# Patient Record
Sex: Male | Born: 1935 | Race: White | Hispanic: No | Smoking: Never smoker
Health system: Southern US, Community
[De-identification: ages and names within clinical notes are randomized; demographics above are authoritative.]

## PROBLEM LIST (undated history)

## (undated) DIAGNOSIS — F419 Anxiety disorder, unspecified: Secondary | ICD-10-CM

## (undated) DIAGNOSIS — I10 Essential (primary) hypertension: Secondary | ICD-10-CM

## (undated) DIAGNOSIS — J310 Chronic rhinitis: Secondary | ICD-10-CM

## (undated) DIAGNOSIS — I509 Heart failure, unspecified: Secondary | ICD-10-CM

## (undated) DIAGNOSIS — I472 Ventricular tachycardia, unspecified: Secondary | ICD-10-CM

## (undated) DIAGNOSIS — J9611 Chronic respiratory failure with hypoxia: Secondary | ICD-10-CM

## (undated) DIAGNOSIS — E785 Hyperlipidemia, unspecified: Secondary | ICD-10-CM

## (undated) DIAGNOSIS — K219 Gastro-esophageal reflux disease without esophagitis: Secondary | ICD-10-CM

## (undated) DIAGNOSIS — C61 Malignant neoplasm of prostate: Secondary | ICD-10-CM

## (undated) DIAGNOSIS — E041 Nontoxic single thyroid nodule: Secondary | ICD-10-CM

## (undated) DIAGNOSIS — G4733 Obstructive sleep apnea (adult) (pediatric): Secondary | ICD-10-CM

## (undated) DIAGNOSIS — J8410 Pulmonary fibrosis, unspecified: Secondary | ICD-10-CM

## (undated) DIAGNOSIS — I2510 Atherosclerotic heart disease of native coronary artery without angina pectoris: Secondary | ICD-10-CM

## (undated) DIAGNOSIS — I441 Atrioventricular block, second degree: Secondary | ICD-10-CM

## (undated) DIAGNOSIS — J189 Pneumonia, unspecified organism: Secondary | ICD-10-CM

## (undated) DIAGNOSIS — M7030 Other bursitis of elbow, unspecified elbow: Secondary | ICD-10-CM

## (undated) DIAGNOSIS — Z9001 Acquired absence of eye: Secondary | ICD-10-CM

## (undated) DIAGNOSIS — I351 Nonrheumatic aortic (valve) insufficiency: Secondary | ICD-10-CM

## (undated) DIAGNOSIS — I776 Arteritis, unspecified: Secondary | ICD-10-CM

## (undated) HISTORY — DX: Ventricular tachycardia: I47.2

## (undated) HISTORY — DX: Nontoxic single thyroid nodule: E04.1

## (undated) HISTORY — DX: Heart failure, unspecified: I50.9

## (undated) HISTORY — DX: Acquired absence of eye: Z90.01

## (undated) HISTORY — DX: Malignant neoplasm of prostate: C61

## (undated) HISTORY — DX: Nonrheumatic aortic (valve) insufficiency: I35.1

## (undated) HISTORY — PX: PACEMAKER INSERTION: SHX728

## (undated) HISTORY — DX: Atrioventricular block, second degree: I44.1

## (undated) HISTORY — DX: Essential (primary) hypertension: I10

## (undated) HISTORY — PX: TYMPANIC MEMBRANE REPAIR: SHX294

## (undated) HISTORY — DX: Obstructive sleep apnea (adult) (pediatric): G47.33

## (undated) HISTORY — PX: SEPTOPLASTY: SUR1290

## (undated) HISTORY — DX: Pneumonia, unspecified organism: J18.9

## (undated) HISTORY — PX: TRANSURETHRAL RESECTION OF PROSTATE: SHX73

## (undated) HISTORY — PX: TYMPANOPLASTY: SHX33

## (undated) HISTORY — DX: Ventricular tachycardia, unspecified: I47.20

## (undated) HISTORY — DX: Chronic respiratory failure with hypoxia: J96.11

## (undated) HISTORY — DX: Anxiety disorder, unspecified: F41.9

## (undated) HISTORY — DX: Other bursitis of elbow, unspecified elbow: M70.30

## (undated) HISTORY — DX: Gastro-esophageal reflux disease without esophagitis: K21.9

## (undated) HISTORY — DX: Hyperlipidemia, unspecified: E78.5

## (undated) HISTORY — DX: Arteritis, unspecified: I77.6

## (undated) HISTORY — DX: Chronic rhinitis: J31.0

## (undated) HISTORY — DX: Atherosclerotic heart disease of native coronary artery without angina pectoris: I25.10

## (undated) HISTORY — DX: Pulmonary fibrosis, unspecified: J84.10

---

## 1999-10-10 HISTORY — PX: CORONARY ARTERY BYPASS GRAFT: SHX141

## 1999-10-12 ENCOUNTER — Inpatient Hospital Stay (HOSPITAL_COMMUNITY): Admission: EM | Admit: 1999-10-12 | Discharge: 1999-10-20 | Payer: Self-pay

## 1999-10-13 ENCOUNTER — Encounter: Payer: Self-pay | Admitting: Cardiology

## 1999-10-15 ENCOUNTER — Encounter: Payer: Self-pay | Admitting: Cardiothoracic Surgery

## 1999-10-16 ENCOUNTER — Encounter: Payer: Self-pay | Admitting: Cardiothoracic Surgery

## 1999-10-17 ENCOUNTER — Encounter: Payer: Self-pay | Admitting: Cardiothoracic Surgery

## 1999-11-12 ENCOUNTER — Encounter (HOSPITAL_COMMUNITY): Admission: RE | Admit: 1999-11-12 | Discharge: 2000-02-10 | Payer: Self-pay | Admitting: Cardiothoracic Surgery

## 2000-03-06 ENCOUNTER — Encounter: Payer: Self-pay | Admitting: Emergency Medicine

## 2000-03-06 ENCOUNTER — Emergency Department (HOSPITAL_COMMUNITY): Admission: EM | Admit: 2000-03-06 | Discharge: 2000-03-06 | Payer: Self-pay | Admitting: Emergency Medicine

## 2000-06-01 ENCOUNTER — Encounter: Payer: Self-pay | Admitting: Emergency Medicine

## 2000-06-01 ENCOUNTER — Emergency Department (HOSPITAL_COMMUNITY): Admission: EM | Admit: 2000-06-01 | Discharge: 2000-06-01 | Payer: Self-pay | Admitting: Emergency Medicine

## 2000-07-27 ENCOUNTER — Encounter: Payer: Self-pay | Admitting: Otolaryngology

## 2000-07-27 ENCOUNTER — Encounter: Admission: RE | Admit: 2000-07-27 | Discharge: 2000-07-27 | Payer: Self-pay | Admitting: Otolaryngology

## 2000-09-08 HISTORY — PX: NEPHRECTOMY: SHX65

## 2000-09-11 ENCOUNTER — Encounter: Payer: Self-pay | Admitting: Emergency Medicine

## 2000-09-11 ENCOUNTER — Emergency Department (HOSPITAL_COMMUNITY): Admission: EM | Admit: 2000-09-11 | Discharge: 2000-09-11 | Payer: Self-pay | Admitting: Emergency Medicine

## 2000-11-22 ENCOUNTER — Encounter (INDEPENDENT_AMBULATORY_CARE_PROVIDER_SITE_OTHER): Payer: Self-pay

## 2000-11-22 ENCOUNTER — Encounter: Payer: Self-pay | Admitting: Internal Medicine

## 2000-11-22 ENCOUNTER — Inpatient Hospital Stay (HOSPITAL_COMMUNITY): Admission: EM | Admit: 2000-11-22 | Discharge: 2000-11-28 | Payer: Self-pay | Admitting: Emergency Medicine

## 2000-11-24 ENCOUNTER — Encounter: Payer: Self-pay | Admitting: Internal Medicine

## 2000-11-25 ENCOUNTER — Encounter: Payer: Self-pay | Admitting: Internal Medicine

## 2000-11-26 ENCOUNTER — Encounter: Payer: Self-pay | Admitting: Internal Medicine

## 2000-11-27 ENCOUNTER — Encounter: Payer: Self-pay | Admitting: Urology

## 2000-12-09 ENCOUNTER — Ambulatory Visit (HOSPITAL_COMMUNITY): Admission: RE | Admit: 2000-12-09 | Discharge: 2000-12-09 | Payer: Self-pay | Admitting: Internal Medicine

## 2000-12-09 ENCOUNTER — Encounter: Payer: Self-pay | Admitting: Internal Medicine

## 2000-12-15 ENCOUNTER — Encounter: Payer: Self-pay | Admitting: Urology

## 2000-12-21 ENCOUNTER — Encounter (INDEPENDENT_AMBULATORY_CARE_PROVIDER_SITE_OTHER): Payer: Self-pay

## 2000-12-21 ENCOUNTER — Encounter: Payer: Self-pay | Admitting: Urology

## 2000-12-21 ENCOUNTER — Inpatient Hospital Stay (HOSPITAL_COMMUNITY): Admission: RE | Admit: 2000-12-21 | Discharge: 2000-12-24 | Payer: Self-pay | Admitting: Urology

## 2001-02-03 ENCOUNTER — Encounter: Admission: RE | Admit: 2001-02-03 | Discharge: 2001-02-03 | Payer: Self-pay | Admitting: Thoracic Surgery

## 2001-02-03 ENCOUNTER — Encounter: Payer: Self-pay | Admitting: Thoracic Surgery

## 2001-07-01 ENCOUNTER — Ambulatory Visit (HOSPITAL_COMMUNITY): Admission: RE | Admit: 2001-07-01 | Discharge: 2001-07-01 | Payer: Self-pay | Admitting: Urology

## 2001-07-01 ENCOUNTER — Encounter: Payer: Self-pay | Admitting: Urology

## 2001-07-12 ENCOUNTER — Encounter: Admission: RE | Admit: 2001-07-12 | Discharge: 2001-07-12 | Payer: Self-pay | Admitting: Urology

## 2001-07-12 ENCOUNTER — Encounter: Payer: Self-pay | Admitting: Urology

## 2001-11-10 ENCOUNTER — Emergency Department (HOSPITAL_COMMUNITY): Admission: EM | Admit: 2001-11-10 | Discharge: 2001-11-10 | Payer: Self-pay | Admitting: *Deleted

## 2001-11-12 ENCOUNTER — Emergency Department (HOSPITAL_COMMUNITY): Admission: EM | Admit: 2001-11-12 | Discharge: 2001-11-12 | Payer: Self-pay | Admitting: *Deleted

## 2001-11-19 ENCOUNTER — Encounter: Payer: Self-pay | Admitting: Internal Medicine

## 2001-11-19 ENCOUNTER — Ambulatory Visit (HOSPITAL_COMMUNITY): Admission: RE | Admit: 2001-11-19 | Discharge: 2001-11-19 | Payer: Self-pay | Admitting: Internal Medicine

## 2002-01-24 ENCOUNTER — Ambulatory Visit (HOSPITAL_COMMUNITY): Admission: RE | Admit: 2002-01-24 | Discharge: 2002-01-24 | Payer: Self-pay | Admitting: Urology

## 2002-01-24 ENCOUNTER — Encounter: Payer: Self-pay | Admitting: Urology

## 2003-05-18 ENCOUNTER — Inpatient Hospital Stay (HOSPITAL_COMMUNITY): Admission: EM | Admit: 2003-05-18 | Discharge: 2003-05-19 | Payer: Self-pay | Admitting: Emergency Medicine

## 2003-05-18 ENCOUNTER — Encounter: Payer: Self-pay | Admitting: Emergency Medicine

## 2004-04-19 ENCOUNTER — Inpatient Hospital Stay (HOSPITAL_COMMUNITY): Admission: EM | Admit: 2004-04-19 | Discharge: 2004-04-20 | Payer: Self-pay | Admitting: Emergency Medicine

## 2004-08-15 ENCOUNTER — Ambulatory Visit: Payer: Self-pay | Admitting: Family Medicine

## 2004-09-11 ENCOUNTER — Ambulatory Visit: Payer: Self-pay | Admitting: Gastroenterology

## 2004-09-25 ENCOUNTER — Ambulatory Visit (HOSPITAL_COMMUNITY): Admission: RE | Admit: 2004-09-25 | Discharge: 2004-09-25 | Payer: Self-pay | Admitting: Urology

## 2004-09-26 ENCOUNTER — Ambulatory Visit: Payer: Self-pay | Admitting: Pulmonary Disease

## 2004-10-15 ENCOUNTER — Ambulatory Visit: Payer: Self-pay | Admitting: Gastroenterology

## 2004-10-21 ENCOUNTER — Ambulatory Visit (HOSPITAL_COMMUNITY): Admission: RE | Admit: 2004-10-21 | Discharge: 2004-10-21 | Payer: Self-pay | Admitting: Gastroenterology

## 2004-10-21 ENCOUNTER — Ambulatory Visit: Payer: Self-pay | Admitting: Pulmonary Disease

## 2004-11-15 ENCOUNTER — Ambulatory Visit: Payer: Self-pay | Admitting: Pulmonary Disease

## 2004-11-27 ENCOUNTER — Ambulatory Visit: Payer: Self-pay | Admitting: Family Medicine

## 2004-12-02 ENCOUNTER — Ambulatory Visit: Payer: Self-pay | Admitting: Pulmonary Disease

## 2004-12-13 ENCOUNTER — Ambulatory Visit: Payer: Self-pay | Admitting: Cardiology

## 2004-12-31 ENCOUNTER — Ambulatory Visit: Admission: RE | Admit: 2004-12-31 | Discharge: 2005-03-31 | Payer: Self-pay | Admitting: Radiation Oncology

## 2005-01-15 ENCOUNTER — Ambulatory Visit: Payer: Self-pay | Admitting: Pulmonary Disease

## 2005-01-24 ENCOUNTER — Ambulatory Visit: Payer: Self-pay

## 2005-02-07 ENCOUNTER — Ambulatory Visit: Payer: Self-pay | Admitting: Cardiology

## 2005-02-11 ENCOUNTER — Ambulatory Visit: Payer: Self-pay | Admitting: Pulmonary Disease

## 2005-03-21 ENCOUNTER — Ambulatory Visit: Payer: Self-pay | Admitting: Pulmonary Disease

## 2005-04-01 ENCOUNTER — Ambulatory Visit: Admission: RE | Admit: 2005-04-01 | Discharge: 2005-06-30 | Payer: Self-pay | Admitting: Radiation Oncology

## 2005-04-02 ENCOUNTER — Ambulatory Visit: Payer: Self-pay | Admitting: Pulmonary Disease

## 2005-05-07 ENCOUNTER — Ambulatory Visit: Payer: Self-pay | Admitting: Pulmonary Disease

## 2005-05-13 ENCOUNTER — Ambulatory Visit: Payer: Self-pay | Admitting: Cardiology

## 2005-05-28 ENCOUNTER — Ambulatory Visit: Payer: Self-pay | Admitting: Cardiology

## 2005-06-11 ENCOUNTER — Ambulatory Visit: Payer: Self-pay

## 2005-06-16 ENCOUNTER — Ambulatory Visit: Payer: Self-pay | Admitting: Pulmonary Disease

## 2005-06-18 ENCOUNTER — Ambulatory Visit: Payer: Self-pay | Admitting: Cardiology

## 2005-07-03 ENCOUNTER — Ambulatory Visit: Payer: Self-pay

## 2005-07-24 ENCOUNTER — Ambulatory Visit: Payer: Self-pay | Admitting: Pulmonary Disease

## 2005-08-08 ENCOUNTER — Ambulatory Visit: Admission: RE | Admit: 2005-08-08 | Discharge: 2005-08-08 | Payer: Self-pay | Admitting: Pulmonary Disease

## 2005-08-08 ENCOUNTER — Ambulatory Visit: Payer: Self-pay | Admitting: Pulmonary Disease

## 2005-08-20 ENCOUNTER — Ambulatory Visit: Payer: Self-pay | Admitting: Family Medicine

## 2005-08-26 ENCOUNTER — Ambulatory Visit: Payer: Self-pay | Admitting: Family Medicine

## 2005-08-27 ENCOUNTER — Ambulatory Visit: Payer: Self-pay | Admitting: Family Medicine

## 2005-08-29 ENCOUNTER — Ambulatory Visit: Payer: Self-pay | Admitting: Pulmonary Disease

## 2005-10-10 ENCOUNTER — Ambulatory Visit: Payer: Self-pay | Admitting: Pulmonary Disease

## 2005-10-15 ENCOUNTER — Ambulatory Visit: Payer: Self-pay | Admitting: Cardiology

## 2005-12-08 ENCOUNTER — Ambulatory Visit: Payer: Self-pay | Admitting: Pulmonary Disease

## 2005-12-19 ENCOUNTER — Ambulatory Visit: Payer: Self-pay | Admitting: Cardiology

## 2006-01-07 ENCOUNTER — Encounter (HOSPITAL_COMMUNITY): Admission: RE | Admit: 2006-01-07 | Discharge: 2006-04-07 | Payer: Self-pay | Admitting: Urology

## 2006-01-27 ENCOUNTER — Ambulatory Visit: Payer: Self-pay | Admitting: Family Medicine

## 2006-02-11 ENCOUNTER — Ambulatory Visit: Payer: Self-pay | Admitting: Pulmonary Disease

## 2006-02-11 ENCOUNTER — Ambulatory Visit: Payer: Self-pay | Admitting: Family Medicine

## 2006-04-22 ENCOUNTER — Ambulatory Visit: Payer: Self-pay | Admitting: Family Medicine

## 2006-04-23 ENCOUNTER — Ambulatory Visit: Payer: Self-pay | Admitting: Pulmonary Disease

## 2006-04-30 ENCOUNTER — Ambulatory Visit: Payer: Self-pay | Admitting: Family Medicine

## 2006-05-08 ENCOUNTER — Ambulatory Visit: Payer: Self-pay | Admitting: Pulmonary Disease

## 2006-05-27 ENCOUNTER — Ambulatory Visit: Payer: Self-pay | Admitting: Pulmonary Disease

## 2006-06-23 ENCOUNTER — Ambulatory Visit: Payer: Self-pay | Admitting: Internal Medicine

## 2006-07-07 ENCOUNTER — Ambulatory Visit: Payer: Self-pay | Admitting: Pulmonary Disease

## 2006-07-14 ENCOUNTER — Ambulatory Visit: Payer: Self-pay | Admitting: Internal Medicine

## 2006-07-22 ENCOUNTER — Ambulatory Visit: Payer: Self-pay | Admitting: Family Medicine

## 2006-08-10 ENCOUNTER — Inpatient Hospital Stay (HOSPITAL_COMMUNITY): Admission: EM | Admit: 2006-08-10 | Discharge: 2006-08-13 | Payer: Self-pay | Admitting: Emergency Medicine

## 2006-08-10 ENCOUNTER — Ambulatory Visit: Payer: Self-pay | Admitting: Cardiovascular Disease

## 2006-08-25 ENCOUNTER — Ambulatory Visit: Payer: Self-pay | Admitting: Family Medicine

## 2006-08-27 ENCOUNTER — Ambulatory Visit: Payer: Self-pay | Admitting: Pulmonary Disease

## 2006-09-03 ENCOUNTER — Ambulatory Visit: Payer: Self-pay

## 2006-09-10 ENCOUNTER — Emergency Department (HOSPITAL_COMMUNITY): Admission: EM | Admit: 2006-09-10 | Discharge: 2006-09-11 | Payer: Self-pay | Admitting: Emergency Medicine

## 2006-09-17 ENCOUNTER — Ambulatory Visit: Payer: Self-pay | Admitting: Cardiology

## 2006-09-21 ENCOUNTER — Ambulatory Visit: Payer: Self-pay | Admitting: Cardiology

## 2006-09-24 ENCOUNTER — Ambulatory Visit (HOSPITAL_BASED_OUTPATIENT_CLINIC_OR_DEPARTMENT_OTHER): Admission: RE | Admit: 2006-09-24 | Discharge: 2006-09-24 | Payer: Self-pay | Admitting: Pulmonary Disease

## 2006-09-24 ENCOUNTER — Ambulatory Visit: Payer: Self-pay | Admitting: Pulmonary Disease

## 2006-09-29 ENCOUNTER — Ambulatory Visit: Payer: Self-pay | Admitting: Cardiology

## 2006-09-29 ENCOUNTER — Ambulatory Visit: Payer: Self-pay

## 2006-09-29 ENCOUNTER — Encounter: Payer: Self-pay | Admitting: Cardiology

## 2006-09-29 LAB — CONVERTED CEMR LAB
CO2: 30 meq/L (ref 19–32)
Chloride: 104 meq/L (ref 96–112)
Creatinine, Ser: 1.5 mg/dL (ref 0.4–1.5)
Glucose, Bld: 81 mg/dL (ref 70–99)
Potassium: 4.5 meq/L (ref 3.5–5.1)
Sodium: 141 meq/L (ref 135–145)

## 2006-10-05 ENCOUNTER — Ambulatory Visit: Payer: Self-pay | Admitting: Family Medicine

## 2006-11-10 ENCOUNTER — Ambulatory Visit: Payer: Self-pay | Admitting: Pulmonary Disease

## 2006-12-02 ENCOUNTER — Ambulatory Visit: Payer: Self-pay | Admitting: Family Medicine

## 2006-12-02 ENCOUNTER — Encounter: Admission: RE | Admit: 2006-12-02 | Discharge: 2006-12-02 | Payer: Self-pay | Admitting: Family Medicine

## 2006-12-04 ENCOUNTER — Ambulatory Visit: Payer: Self-pay | Admitting: Pulmonary Disease

## 2006-12-11 ENCOUNTER — Ambulatory Visit: Payer: Self-pay | Admitting: Critical Care Medicine

## 2006-12-13 ENCOUNTER — Inpatient Hospital Stay (HOSPITAL_COMMUNITY): Admission: EM | Admit: 2006-12-13 | Discharge: 2006-12-19 | Payer: Self-pay | Admitting: Emergency Medicine

## 2006-12-13 ENCOUNTER — Ambulatory Visit: Payer: Self-pay | Admitting: Pulmonary Disease

## 2006-12-14 ENCOUNTER — Ambulatory Visit: Payer: Self-pay | Admitting: Internal Medicine

## 2006-12-22 ENCOUNTER — Ambulatory Visit: Payer: Self-pay | Admitting: Pulmonary Disease

## 2007-01-08 ENCOUNTER — Ambulatory Visit: Payer: Self-pay | Admitting: Cardiology

## 2007-01-11 ENCOUNTER — Ambulatory Visit: Payer: Self-pay | Admitting: Pulmonary Disease

## 2007-01-12 ENCOUNTER — Ambulatory Visit: Payer: Self-pay | Admitting: Cardiology

## 2007-01-12 ENCOUNTER — Ambulatory Visit: Payer: Self-pay | Admitting: Internal Medicine

## 2007-01-12 LAB — CONVERTED CEMR LAB
ALT: 16 units/L (ref 0–40)
AST: 14 units/L (ref 0–37)
Albumin: 3.5 g/dL (ref 3.5–5.2)
BUN: 17 mg/dL (ref 6–23)
Bilirubin, Direct: 0.1 mg/dL (ref 0.0–0.3)
CO2: 28 meq/L (ref 19–32)
Calcium: 9.2 mg/dL (ref 8.4–10.5)
Chloride: 107 meq/L (ref 96–112)
Chloride: 107 meq/L (ref 96–112)
Cholesterol: 164 mg/dL (ref 0–200)
Creatinine, Ser: 1.4 mg/dL (ref 0.4–1.5)
GFR calc Af Amer: 62 mL/min
GFR calc non Af Amer: 52 mL/min
Glucose, Bld: 86 mg/dL (ref 70–99)
LDL Cholesterol: 78 mg/dL (ref 0–99)
Magnesium: 2.3 mg/dL (ref 1.5–2.5)
Sodium: 142 meq/L (ref 135–145)
Total CHOL/HDL Ratio: 2.2
VLDL: 10 mg/dL (ref 0–40)

## 2007-01-15 ENCOUNTER — Ambulatory Visit: Payer: Self-pay

## 2007-02-08 ENCOUNTER — Ambulatory Visit: Payer: Self-pay | Admitting: Pulmonary Disease

## 2007-03-04 ENCOUNTER — Ambulatory Visit: Payer: Self-pay | Admitting: Pulmonary Disease

## 2007-03-17 ENCOUNTER — Ambulatory Visit: Payer: Self-pay | Admitting: Internal Medicine

## 2007-03-17 ENCOUNTER — Ambulatory Visit: Payer: Self-pay | Admitting: Pulmonary Disease

## 2007-04-02 ENCOUNTER — Ambulatory Visit: Payer: Self-pay | Admitting: Family Medicine

## 2007-04-02 DIAGNOSIS — E785 Hyperlipidemia, unspecified: Secondary | ICD-10-CM | POA: Insufficient documentation

## 2007-04-02 DIAGNOSIS — E041 Nontoxic single thyroid nodule: Secondary | ICD-10-CM

## 2007-04-02 DIAGNOSIS — Z8546 Personal history of malignant neoplasm of prostate: Secondary | ICD-10-CM | POA: Insufficient documentation

## 2007-04-02 DIAGNOSIS — K219 Gastro-esophageal reflux disease without esophagitis: Secondary | ICD-10-CM | POA: Insufficient documentation

## 2007-04-02 DIAGNOSIS — I10 Essential (primary) hypertension: Secondary | ICD-10-CM | POA: Insufficient documentation

## 2007-04-07 ENCOUNTER — Telehealth: Payer: Self-pay | Admitting: Family Medicine

## 2007-04-08 ENCOUNTER — Encounter: Admission: RE | Admit: 2007-04-08 | Discharge: 2007-04-08 | Payer: Self-pay | Admitting: Family Medicine

## 2007-04-12 ENCOUNTER — Ambulatory Visit: Payer: Self-pay | Admitting: Internal Medicine

## 2007-04-15 ENCOUNTER — Inpatient Hospital Stay (HOSPITAL_COMMUNITY): Admission: AD | Admit: 2007-04-15 | Discharge: 2007-04-20 | Payer: Self-pay | Admitting: Pulmonary Disease

## 2007-04-15 ENCOUNTER — Ambulatory Visit: Payer: Self-pay | Admitting: Internal Medicine

## 2007-04-15 ENCOUNTER — Ambulatory Visit: Payer: Self-pay | Admitting: Pulmonary Disease

## 2007-04-22 ENCOUNTER — Telehealth: Payer: Self-pay | Admitting: Family Medicine

## 2007-04-26 ENCOUNTER — Ambulatory Visit: Payer: Self-pay | Admitting: Internal Medicine

## 2007-04-27 ENCOUNTER — Ambulatory Visit: Payer: Self-pay

## 2007-04-27 LAB — CONVERTED CEMR LAB
BUN: 40 mg/dL — ABNORMAL HIGH (ref 6–23)
CO2: 32 meq/L (ref 19–32)
Chloride: 105 meq/L (ref 96–112)
Creatinine, Ser: 1.4 mg/dL (ref 0.4–1.5)
Pro B Natriuretic peptide (BNP): 249 pg/mL — ABNORMAL HIGH (ref 0.0–100.0)
Sodium: 144 meq/L (ref 135–145)

## 2007-05-04 ENCOUNTER — Ambulatory Visit: Payer: Self-pay | Admitting: Internal Medicine

## 2007-05-04 LAB — CONVERTED CEMR LAB
BUN: 37 mg/dL — ABNORMAL HIGH (ref 6–23)
CO2: 28 meq/L (ref 19–32)
Chloride: 104 meq/L (ref 96–112)
Creatinine, Ser: 1.5 mg/dL (ref 0.4–1.5)
Pro B Natriuretic peptide (BNP): 190 pg/mL — ABNORMAL HIGH (ref 0.0–100.0)

## 2007-05-05 ENCOUNTER — Inpatient Hospital Stay (HOSPITAL_COMMUNITY): Admission: EM | Admit: 2007-05-05 | Discharge: 2007-05-13 | Payer: Self-pay | Admitting: Pulmonary Disease

## 2007-05-05 ENCOUNTER — Ambulatory Visit: Payer: Self-pay | Admitting: Pulmonary Disease

## 2007-05-05 ENCOUNTER — Ambulatory Visit: Payer: Self-pay | Admitting: Cardiology

## 2007-05-06 ENCOUNTER — Ambulatory Visit: Payer: Self-pay | Admitting: Internal Medicine

## 2007-05-24 ENCOUNTER — Encounter: Payer: Self-pay | Admitting: Family Medicine

## 2007-05-27 ENCOUNTER — Ambulatory Visit: Payer: Self-pay | Admitting: Pulmonary Disease

## 2007-05-27 ENCOUNTER — Ambulatory Visit: Payer: Self-pay | Admitting: Internal Medicine

## 2007-06-01 ENCOUNTER — Ambulatory Visit: Payer: Self-pay | Admitting: Cardiology

## 2007-06-01 LAB — CONVERTED CEMR LAB
BUN: 39 mg/dL — ABNORMAL HIGH (ref 6–23)
Chloride: 105 meq/L (ref 96–112)
Creatinine, Ser: 1.8 mg/dL — ABNORMAL HIGH (ref 0.4–1.5)
Pro B Natriuretic peptide (BNP): 170 pg/mL — ABNORMAL HIGH (ref 0.0–100.0)

## 2007-06-14 ENCOUNTER — Encounter: Payer: Self-pay | Admitting: Family Medicine

## 2007-06-16 ENCOUNTER — Ambulatory Visit: Payer: Self-pay | Admitting: Internal Medicine

## 2007-06-28 ENCOUNTER — Ambulatory Visit: Payer: Self-pay | Admitting: Pulmonary Disease

## 2007-06-29 ENCOUNTER — Ambulatory Visit: Payer: Self-pay | Admitting: Internal Medicine

## 2007-06-29 ENCOUNTER — Encounter: Payer: Self-pay | Admitting: Family Medicine

## 2007-07-08 ENCOUNTER — Telehealth: Payer: Self-pay | Admitting: Family Medicine

## 2007-07-16 ENCOUNTER — Ambulatory Visit: Payer: Self-pay | Admitting: Family Medicine

## 2007-07-16 DIAGNOSIS — M109 Gout, unspecified: Secondary | ICD-10-CM

## 2007-07-27 ENCOUNTER — Telehealth: Payer: Self-pay | Admitting: Pulmonary Disease

## 2007-07-29 ENCOUNTER — Ambulatory Visit: Payer: Self-pay | Admitting: Pulmonary Disease

## 2007-08-17 ENCOUNTER — Ambulatory Visit: Payer: Self-pay

## 2007-08-17 ENCOUNTER — Ambulatory Visit: Payer: Self-pay | Admitting: Cardiology

## 2007-08-17 LAB — CONVERTED CEMR LAB
ALT: 15 units/L (ref 0–53)
Bilirubin, Direct: 0.1 mg/dL (ref 0.0–0.3)
CO2: 28 meq/L (ref 19–32)
Calcium: 9.1 mg/dL (ref 8.4–10.5)
Cholesterol: 164 mg/dL (ref 0–200)
GFR calc Af Amer: 50 mL/min
Glucose, Bld: 93 mg/dL (ref 70–99)
HDL: 54 mg/dL (ref >39.0)
Potassium: 3.7 meq/L (ref 3.5–5.1)
Sodium: 142 meq/L (ref 135–145)
Total CHOL/HDL Ratio: 3
Total Protein: 6.1 g/dL (ref 6.0–8.3)
Triglycerides: 153 mg/dL — ABNORMAL HIGH (ref 0–149)

## 2007-08-24 ENCOUNTER — Encounter: Payer: Self-pay | Admitting: Family Medicine

## 2007-08-24 ENCOUNTER — Telehealth (INDEPENDENT_AMBULATORY_CARE_PROVIDER_SITE_OTHER): Payer: Self-pay | Admitting: *Deleted

## 2007-09-07 ENCOUNTER — Ambulatory Visit: Payer: Self-pay | Admitting: Cardiology

## 2007-09-07 ENCOUNTER — Telehealth (INDEPENDENT_AMBULATORY_CARE_PROVIDER_SITE_OTHER): Payer: Self-pay | Admitting: *Deleted

## 2007-09-07 ENCOUNTER — Inpatient Hospital Stay (HOSPITAL_COMMUNITY): Admission: EM | Admit: 2007-09-07 | Discharge: 2007-09-13 | Payer: Self-pay | Admitting: *Deleted

## 2007-09-07 ENCOUNTER — Ambulatory Visit: Payer: Self-pay | Admitting: Pulmonary Disease

## 2007-09-07 DIAGNOSIS — J8410 Pulmonary fibrosis, unspecified: Secondary | ICD-10-CM

## 2007-09-15 ENCOUNTER — Ambulatory Visit: Payer: Self-pay | Admitting: Internal Medicine

## 2007-09-17 ENCOUNTER — Telehealth: Payer: Self-pay | Admitting: Adult Health

## 2007-09-20 ENCOUNTER — Ambulatory Visit: Payer: Self-pay | Admitting: Pulmonary Disease

## 2007-09-20 DIAGNOSIS — J9610 Chronic respiratory failure, unspecified whether with hypoxia or hypercapnia: Secondary | ICD-10-CM | POA: Insufficient documentation

## 2007-09-20 DIAGNOSIS — G4733 Obstructive sleep apnea (adult) (pediatric): Secondary | ICD-10-CM | POA: Insufficient documentation

## 2007-09-20 DIAGNOSIS — I5042 Chronic combined systolic (congestive) and diastolic (congestive) heart failure: Secondary | ICD-10-CM | POA: Insufficient documentation

## 2007-10-08 ENCOUNTER — Telehealth: Payer: Self-pay | Admitting: Pulmonary Disease

## 2007-10-13 ENCOUNTER — Ambulatory Visit: Payer: Self-pay | Admitting: Pulmonary Disease

## 2007-10-19 ENCOUNTER — Ambulatory Visit: Payer: Self-pay | Admitting: Pulmonary Disease

## 2007-10-20 ENCOUNTER — Telehealth (INDEPENDENT_AMBULATORY_CARE_PROVIDER_SITE_OTHER): Payer: Self-pay | Admitting: *Deleted

## 2007-11-08 ENCOUNTER — Ambulatory Visit (HOSPITAL_COMMUNITY): Admission: RE | Admit: 2007-11-08 | Discharge: 2007-11-08 | Payer: Self-pay | Admitting: Emergency Medicine

## 2007-11-08 ENCOUNTER — Encounter: Payer: Self-pay | Admitting: Family Medicine

## 2007-11-09 ENCOUNTER — Encounter: Payer: Self-pay | Admitting: Critical Care Medicine

## 2007-11-09 ENCOUNTER — Encounter: Payer: Self-pay | Admitting: Family Medicine

## 2007-11-12 ENCOUNTER — Ambulatory Visit: Payer: Self-pay | Admitting: Family Medicine

## 2007-11-12 DIAGNOSIS — M810 Age-related osteoporosis without current pathological fracture: Secondary | ICD-10-CM | POA: Insufficient documentation

## 2007-11-12 DIAGNOSIS — S32009A Unspecified fracture of unspecified lumbar vertebra, initial encounter for closed fracture: Secondary | ICD-10-CM | POA: Insufficient documentation

## 2007-11-15 ENCOUNTER — Encounter: Payer: Self-pay | Admitting: Family Medicine

## 2007-11-25 ENCOUNTER — Telehealth: Payer: Self-pay | Admitting: Pulmonary Disease

## 2007-11-30 ENCOUNTER — Ambulatory Visit: Payer: Self-pay | Admitting: Pulmonary Disease

## 2007-12-02 ENCOUNTER — Encounter: Admission: RE | Admit: 2007-12-02 | Discharge: 2007-12-02 | Payer: Self-pay | Admitting: General Surgery

## 2007-12-03 ENCOUNTER — Ambulatory Visit (HOSPITAL_COMMUNITY): Admission: RE | Admit: 2007-12-03 | Discharge: 2007-12-03 | Payer: Self-pay | Admitting: Emergency Medicine

## 2007-12-07 ENCOUNTER — Telehealth (INDEPENDENT_AMBULATORY_CARE_PROVIDER_SITE_OTHER): Payer: Self-pay | Admitting: *Deleted

## 2007-12-08 HISTORY — PX: KYPHOSIS SURGERY: SHX114

## 2007-12-10 ENCOUNTER — Encounter: Payer: Self-pay | Admitting: Family Medicine

## 2007-12-10 ENCOUNTER — Telehealth (INDEPENDENT_AMBULATORY_CARE_PROVIDER_SITE_OTHER): Payer: Self-pay | Admitting: *Deleted

## 2007-12-14 ENCOUNTER — Telehealth: Payer: Self-pay | Admitting: Pulmonary Disease

## 2007-12-18 ENCOUNTER — Telehealth: Payer: Self-pay | Admitting: Family Medicine

## 2007-12-21 ENCOUNTER — Telehealth (INDEPENDENT_AMBULATORY_CARE_PROVIDER_SITE_OTHER): Payer: Self-pay | Admitting: *Deleted

## 2007-12-22 ENCOUNTER — Ambulatory Visit: Payer: Self-pay | Admitting: Cardiology

## 2007-12-22 ENCOUNTER — Ambulatory Visit: Payer: Self-pay | Admitting: Internal Medicine

## 2007-12-22 LAB — CONVERTED CEMR LAB
Chloride: 104 meq/L (ref 96–112)
Creatinine, Ser: 1.4 mg/dL (ref 0.4–1.5)
GFR calc non Af Amer: 51 mL/min

## 2007-12-23 ENCOUNTER — Ambulatory Visit: Payer: Self-pay | Admitting: Pulmonary Disease

## 2007-12-24 ENCOUNTER — Telehealth: Payer: Self-pay | Admitting: Pulmonary Disease

## 2007-12-27 ENCOUNTER — Ambulatory Visit: Payer: Self-pay | Admitting: Cardiology

## 2007-12-27 ENCOUNTER — Encounter: Payer: Self-pay | Admitting: Family Medicine

## 2007-12-27 LAB — CONVERTED CEMR LAB
CO2: 28 meq/L (ref 19–32)
Calcium: 9.5 mg/dL (ref 8.4–10.5)
GFR calc Af Amer: 62 mL/min
Sodium: 141 meq/L (ref 135–145)

## 2007-12-28 ENCOUNTER — Telehealth (INDEPENDENT_AMBULATORY_CARE_PROVIDER_SITE_OTHER): Payer: Self-pay | Admitting: *Deleted

## 2007-12-30 ENCOUNTER — Encounter (INDEPENDENT_AMBULATORY_CARE_PROVIDER_SITE_OTHER): Payer: Self-pay | Admitting: Neurosurgery

## 2007-12-30 ENCOUNTER — Telehealth (INDEPENDENT_AMBULATORY_CARE_PROVIDER_SITE_OTHER): Payer: Self-pay | Admitting: *Deleted

## 2007-12-30 ENCOUNTER — Inpatient Hospital Stay (HOSPITAL_COMMUNITY): Admission: RE | Admit: 2007-12-30 | Discharge: 2007-12-31 | Payer: Self-pay | Admitting: Neurosurgery

## 2008-01-01 ENCOUNTER — Observation Stay (HOSPITAL_COMMUNITY): Admission: EM | Admit: 2008-01-01 | Discharge: 2008-01-02 | Payer: Self-pay | Admitting: Emergency Medicine

## 2008-01-01 ENCOUNTER — Ambulatory Visit: Payer: Self-pay | Admitting: Internal Medicine

## 2008-01-01 ENCOUNTER — Ambulatory Visit: Payer: Self-pay | Admitting: Critical Care Medicine

## 2008-01-02 ENCOUNTER — Encounter: Payer: Self-pay | Admitting: Family Medicine

## 2008-01-07 ENCOUNTER — Ambulatory Visit: Payer: Self-pay | Admitting: Pulmonary Disease

## 2008-01-18 ENCOUNTER — Encounter: Payer: Self-pay | Admitting: Family Medicine

## 2008-01-24 ENCOUNTER — Ambulatory Visit: Payer: Self-pay | Admitting: Cardiology

## 2008-01-24 LAB — CONVERTED CEMR LAB
ALT: 17 units/L (ref 0–53)
AST: 15 units/L (ref 0–37)
Albumin: 3.4 g/dL — ABNORMAL LOW (ref 3.5–5.2)
BUN: 26 mg/dL — ABNORMAL HIGH (ref 6–23)
CO2: 31 meq/L (ref 19–32)
Chloride: 105 meq/L (ref 96–112)
Cholesterol: 145 mg/dL (ref 0–200)
Creatinine, Ser: 1.4 mg/dL (ref 0.4–1.5)
Glucose, Bld: 93 mg/dL (ref 70–99)
LDL Cholesterol: 68 mg/dL (ref 0–99)
Magnesium: 2 mg/dL (ref 1.5–2.5)
Total Protein: 5.8 g/dL — ABNORMAL LOW (ref 6.0–8.3)
Triglycerides: 108 mg/dL (ref 0–149)

## 2008-02-08 ENCOUNTER — Encounter: Payer: Self-pay | Admitting: Pulmonary Disease

## 2008-02-08 ENCOUNTER — Ambulatory Visit: Payer: Self-pay | Admitting: Pulmonary Disease

## 2008-02-09 ENCOUNTER — Encounter: Payer: Self-pay | Admitting: Pulmonary Disease

## 2008-02-18 ENCOUNTER — Encounter: Payer: Self-pay | Admitting: Family Medicine

## 2008-02-28 ENCOUNTER — Encounter: Payer: Self-pay | Admitting: Family Medicine

## 2008-02-29 ENCOUNTER — Encounter: Payer: Self-pay | Admitting: Family Medicine

## 2008-03-08 ENCOUNTER — Ambulatory Visit: Payer: Self-pay | Admitting: Pulmonary Disease

## 2008-03-08 ENCOUNTER — Ambulatory Visit: Payer: Self-pay | Admitting: Cardiology

## 2008-03-08 ENCOUNTER — Ambulatory Visit: Payer: Self-pay | Admitting: Internal Medicine

## 2008-03-13 ENCOUNTER — Ambulatory Visit: Payer: Self-pay

## 2008-03-13 ENCOUNTER — Encounter: Payer: Self-pay | Admitting: Cardiology

## 2008-03-13 ENCOUNTER — Ambulatory Visit: Payer: Self-pay | Admitting: Cardiology

## 2008-03-13 LAB — CONVERTED CEMR LAB
Calcium: 9.6 mg/dL (ref 8.4–10.5)
Creatinine, Ser: 1.4 mg/dL (ref 0.4–1.5)
GFR calc non Af Amer: 51 mL/min
Pro B Natriuretic peptide (BNP): 55 pg/mL (ref 0.0–100.0)

## 2008-03-14 ENCOUNTER — Telehealth (INDEPENDENT_AMBULATORY_CARE_PROVIDER_SITE_OTHER): Payer: Self-pay | Admitting: *Deleted

## 2008-03-15 ENCOUNTER — Encounter: Payer: Self-pay | Admitting: Pulmonary Disease

## 2008-03-17 ENCOUNTER — Telehealth (INDEPENDENT_AMBULATORY_CARE_PROVIDER_SITE_OTHER): Payer: Self-pay | Admitting: *Deleted

## 2008-03-24 ENCOUNTER — Encounter: Payer: Self-pay | Admitting: Family Medicine

## 2008-03-29 ENCOUNTER — Ambulatory Visit: Payer: Self-pay | Admitting: Pulmonary Disease

## 2008-03-29 LAB — CONVERTED CEMR LAB
Basophils Absolute: 0.1 10*3/uL (ref 0.0–0.1)
GFR calc Af Amer: 57 mL/min
Glucose, Bld: 121 mg/dL — ABNORMAL HIGH (ref 70–99)
Lymphocytes Relative: 5.9 % — ABNORMAL LOW (ref 12.0–46.0)
MCHC: 35.4 g/dL (ref 30.0–36.0)
Monocytes Absolute: 0.6 10*3/uL (ref 0.1–1.0)
Monocytes Relative: 6.5 % (ref 3.0–12.0)
Platelets: 260 10*3/uL (ref 150–400)
Potassium: 3.9 meq/L (ref 3.5–5.1)
RDW: 13.7 % (ref 11.5–14.6)
Sed Rate: 25 mm/hr — ABNORMAL HIGH (ref 0–16)
Sodium: 143 meq/L (ref 135–145)

## 2008-04-03 LAB — CONVERTED CEMR LAB: Neutrophil cytoplasmic antibodies,IgG,serum: <1:20 {titer}

## 2008-05-02 ENCOUNTER — Ambulatory Visit: Payer: Self-pay | Admitting: Pulmonary Disease

## 2008-05-02 DIAGNOSIS — F411 Generalized anxiety disorder: Secondary | ICD-10-CM | POA: Insufficient documentation

## 2008-05-03 ENCOUNTER — Encounter: Payer: Self-pay | Admitting: Family Medicine

## 2008-05-04 ENCOUNTER — Ambulatory Visit: Payer: Self-pay | Admitting: Cardiology

## 2008-05-04 LAB — CONVERTED CEMR LAB
CO2: 34 meq/L — ABNORMAL HIGH (ref 19–32)
Calcium: 9.2 mg/dL (ref 8.4–10.5)
GFR calc Af Amer: 62 mL/min
GFR calc non Af Amer: 51 mL/min
Sodium: 143 meq/L (ref 135–145)

## 2008-05-05 ENCOUNTER — Encounter: Payer: Self-pay | Admitting: Family Medicine

## 2008-05-05 ENCOUNTER — Ambulatory Visit: Payer: Self-pay

## 2008-05-08 ENCOUNTER — Ambulatory Visit: Payer: Self-pay | Admitting: Family Medicine

## 2008-05-08 DIAGNOSIS — R609 Edema, unspecified: Secondary | ICD-10-CM | POA: Insufficient documentation

## 2008-05-10 ENCOUNTER — Telehealth (INDEPENDENT_AMBULATORY_CARE_PROVIDER_SITE_OTHER): Payer: Self-pay | Admitting: *Deleted

## 2008-05-11 ENCOUNTER — Ambulatory Visit: Payer: Self-pay | Admitting: Internal Medicine

## 2008-05-22 ENCOUNTER — Ambulatory Visit: Payer: Self-pay | Admitting: Family Medicine

## 2008-05-25 ENCOUNTER — Ambulatory Visit: Payer: Self-pay | Admitting: Cardiology

## 2008-05-25 LAB — CONVERTED CEMR LAB
Calcium: 9.4 mg/dL (ref 8.4–10.5)
GFR calc Af Amer: 57 mL/min
GFR calc non Af Amer: 48 mL/min
Glucose, Bld: 103 mg/dL — ABNORMAL HIGH (ref 70–99)
Potassium: 3.5 meq/L (ref 3.5–5.1)
Pro B Natriuretic peptide (BNP): 71 pg/mL (ref 0.0–100.0)
Sodium: 141 meq/L (ref 135–145)

## 2008-05-30 ENCOUNTER — Ambulatory Visit: Payer: Self-pay | Admitting: Pulmonary Disease

## 2008-05-31 ENCOUNTER — Ambulatory Visit: Payer: Self-pay | Admitting: Internal Medicine

## 2008-06-05 ENCOUNTER — Telehealth (INDEPENDENT_AMBULATORY_CARE_PROVIDER_SITE_OTHER): Payer: Self-pay | Admitting: *Deleted

## 2008-06-06 ENCOUNTER — Ambulatory Visit: Payer: Self-pay | Admitting: Pulmonary Disease

## 2008-06-06 DIAGNOSIS — J310 Chronic rhinitis: Secondary | ICD-10-CM

## 2008-06-07 ENCOUNTER — Encounter: Payer: Self-pay | Admitting: Family Medicine

## 2008-06-21 ENCOUNTER — Ambulatory Visit: Payer: Self-pay | Admitting: Family Medicine

## 2008-06-21 DIAGNOSIS — M25559 Pain in unspecified hip: Secondary | ICD-10-CM

## 2008-06-23 ENCOUNTER — Telehealth (INDEPENDENT_AMBULATORY_CARE_PROVIDER_SITE_OTHER): Payer: Self-pay | Admitting: *Deleted

## 2008-06-30 ENCOUNTER — Encounter: Payer: Self-pay | Admitting: Family Medicine

## 2008-07-04 ENCOUNTER — Encounter: Payer: Self-pay | Admitting: Family Medicine

## 2008-07-11 ENCOUNTER — Ambulatory Visit: Payer: Self-pay | Admitting: Pulmonary Disease

## 2008-07-19 ENCOUNTER — Encounter: Payer: Self-pay | Admitting: Family Medicine

## 2008-07-21 ENCOUNTER — Ambulatory Visit: Payer: Self-pay | Admitting: Cardiology

## 2008-07-21 LAB — CONVERTED CEMR LAB
Calcium: 9.7 mg/dL (ref 8.4–10.5)
GFR calc Af Amer: 53 mL/min
GFR calc non Af Amer: 44 mL/min
Potassium: 4 meq/L (ref 3.5–5.1)
Sodium: 141 meq/L (ref 135–145)

## 2008-07-30 ENCOUNTER — Encounter: Payer: Self-pay | Admitting: Pulmonary Disease

## 2008-08-08 ENCOUNTER — Ambulatory Visit: Payer: Self-pay | Admitting: Pulmonary Disease

## 2008-08-09 IMAGING — CT CT CHEST W/ CM
2 of 3 series · 15 of 36 positions shown, 18 images · IV contrast (APPLIED)
Comparison: Chest radiographs 12/13/2006

CLINICAL DATA: Hemoptysis, pneumonia, history of prostate cancer and renal cell cancer. 
 CHEST CT WITH CONTRAST:
TECHNIQUE: Multidetector CT imaging of the chest was performed following the standard protocol during bolus administration of intravenous contrast.
 Contrast:  60 cc Omnipaque 300

[Series 2: routine chest 5.0 st · axial · 0.72mm/px · z∈[-352,-38]mm · 12 of 75 slices shown, 15 images]
[im 6/75  mediastinal]
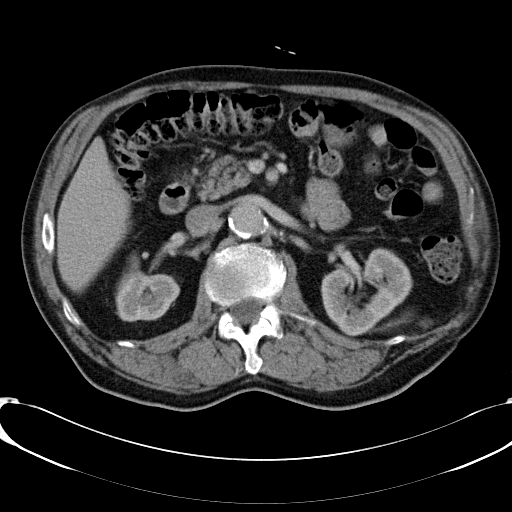
[im 6/75  lung]
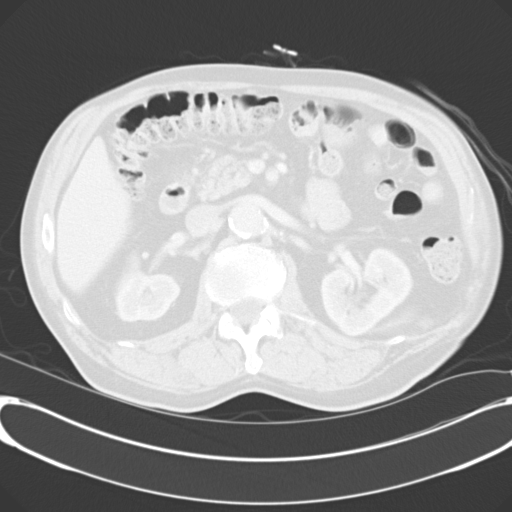
[im 11/75  lung]
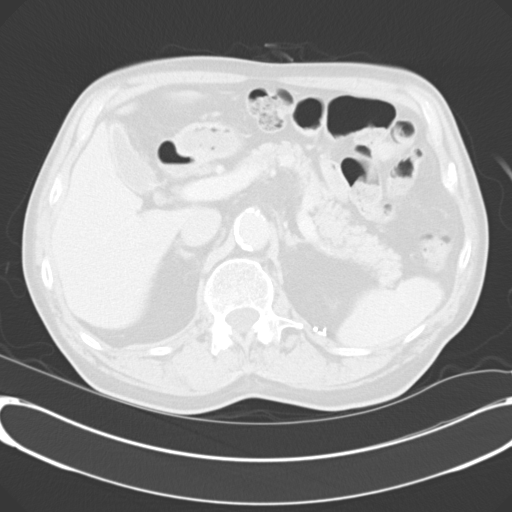
[im 17/75  lung]
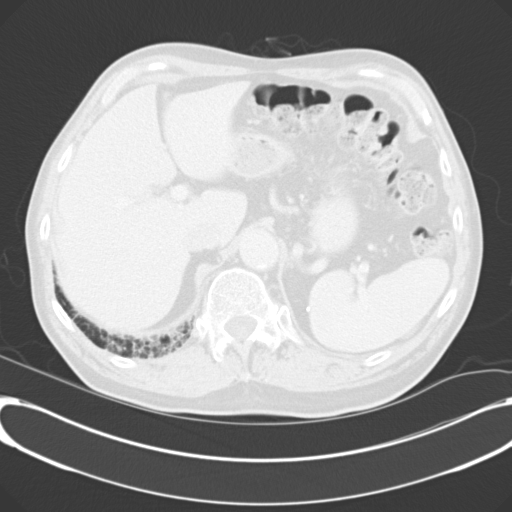
[im 22/75  lung]
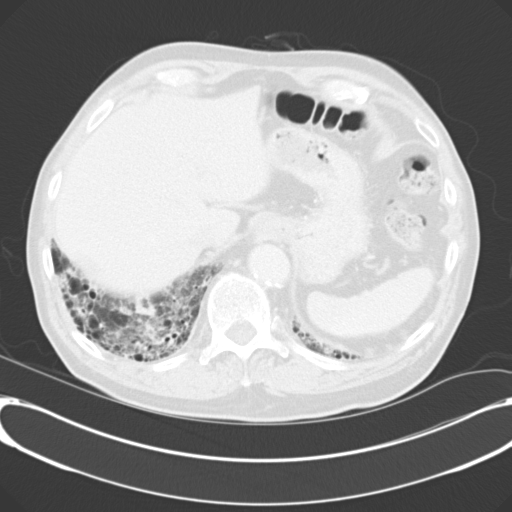
[im 28/75  mediastinal]
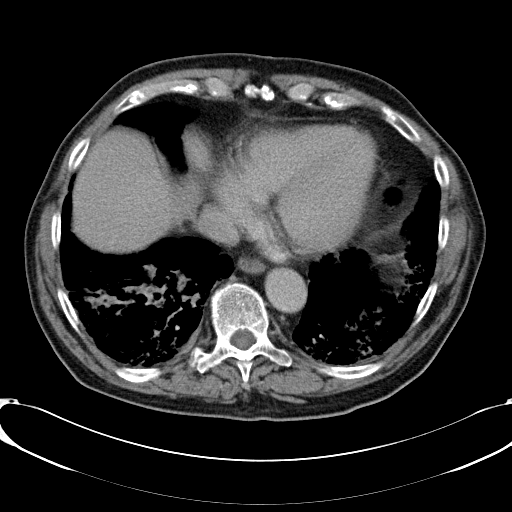
[im 28/75  lung]
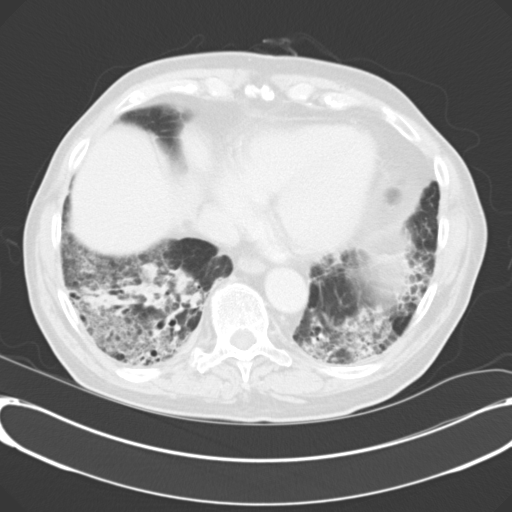
[im 33/75  lung]
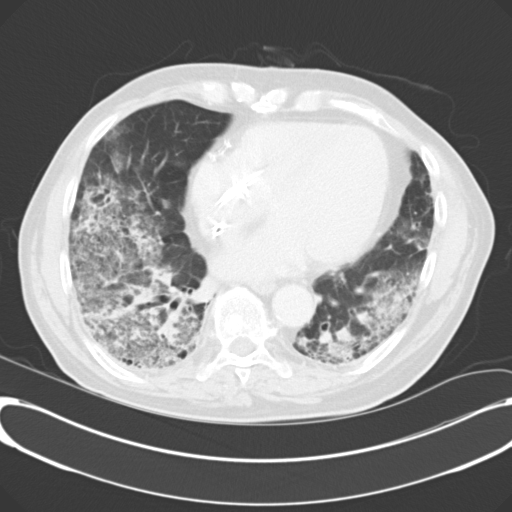
[im 42/75  lung]
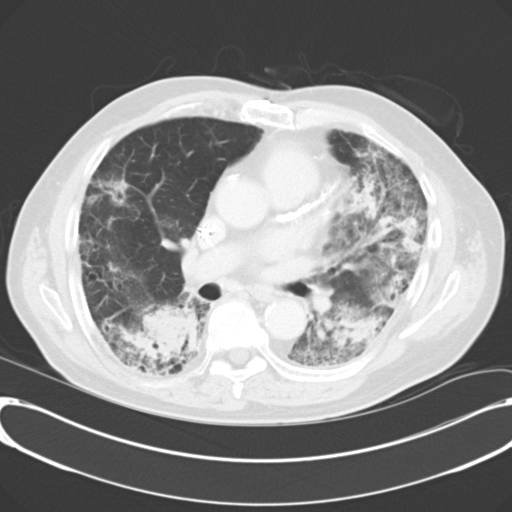
[im 47/75  lung]
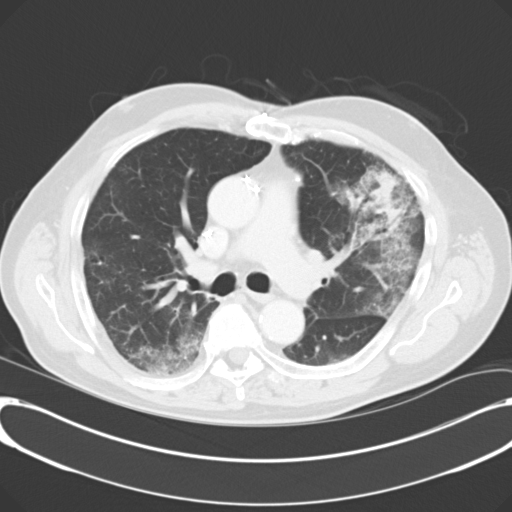
[im 53/75  mediastinal]
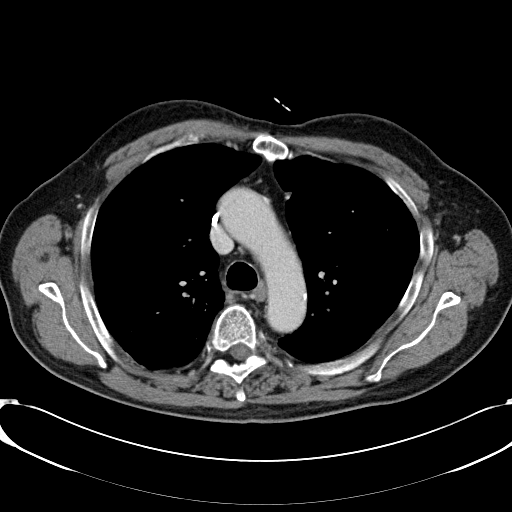
[im 53/75  lung]
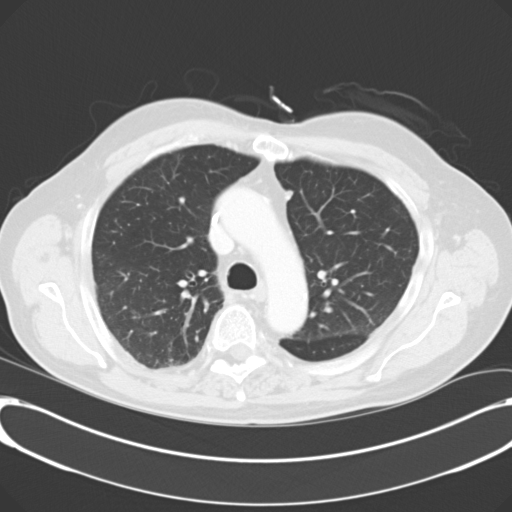
[im 58/75  lung]
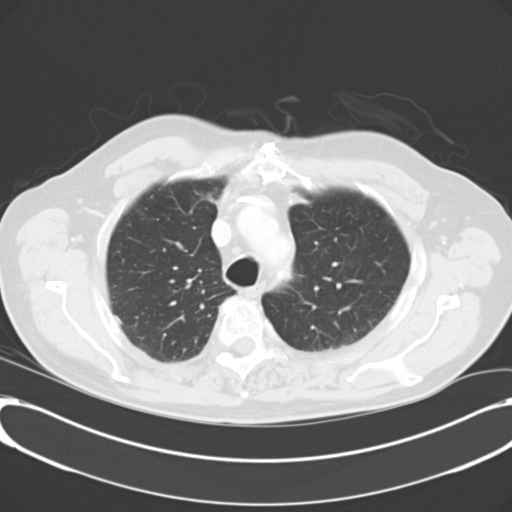
[im 64/75  lung]
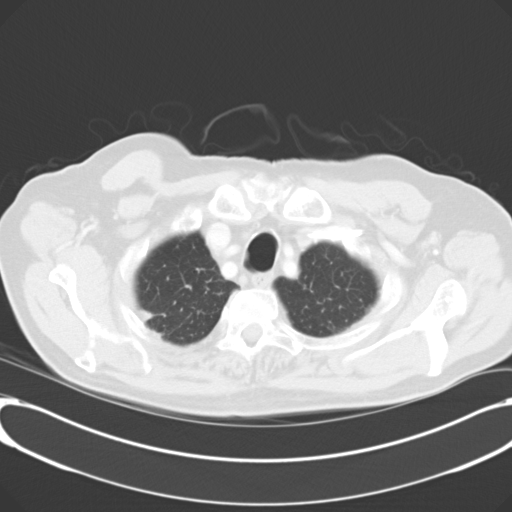
[im 69/75  lung]
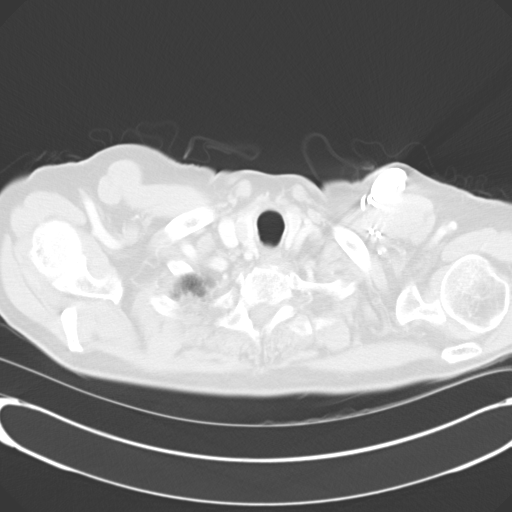

[Series 603: cor · coronal · 0.73mm/px · 3 of 126 slices shown]
[im 26/126  lung]
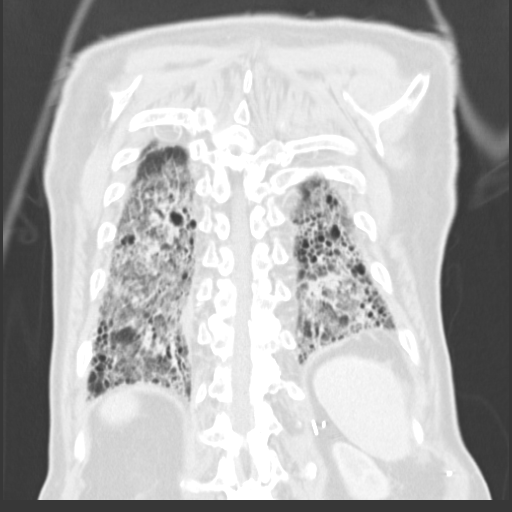
[im 51/126  lung]
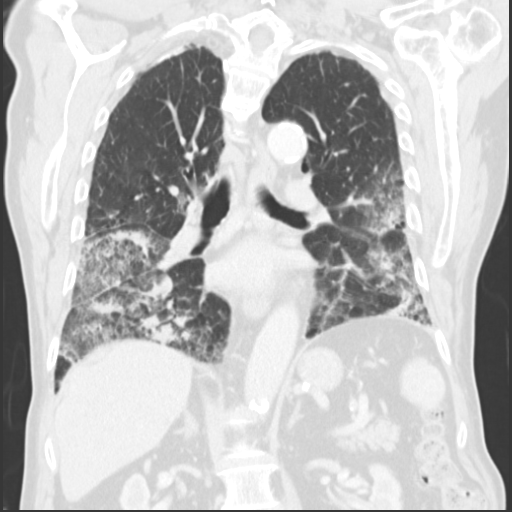
[im 76/126  lung]
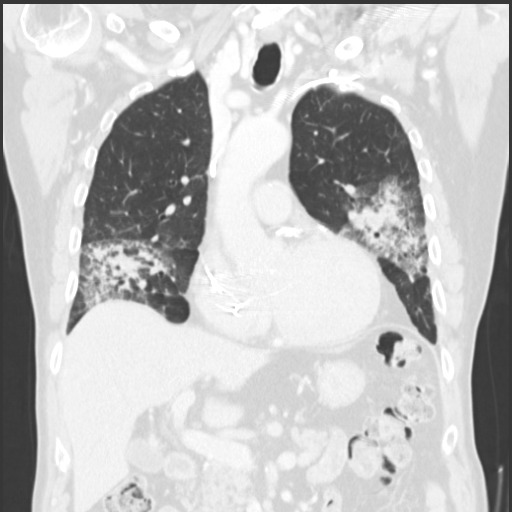

[15 of 36 positions shown; findings below may reference images not displayed]

FINDINGS: There is pleural and parenchymal scarring at the apices.  There are areas of patchy consolidation bilaterally in the lower lobes and in the lingula and to a lesser extent in the right middle lobe.  This is consistent with pneumonia.  I cannot identify an underlying mass.  The patient does have underlying emphysematous change.  There is no significant pleural fluid accumulation.  No pathologically enlarged lymph nodes are seen.  There is atherosclerosis of the aorta but no aneurysm.   Scans in the upper abdomen do not show any abnormality.
IMPRESSION: Patchy pulmonary infiltrates in the lower lungs consistent with pneumonia.  No sign of underlying mass lesion at this time.  The patient does have some bronchiectasis and/or emphysema in the region.

## 2008-08-30 ENCOUNTER — Ambulatory Visit: Payer: Self-pay | Admitting: Internal Medicine

## 2008-09-05 ENCOUNTER — Encounter: Payer: Self-pay | Admitting: Family Medicine

## 2008-09-07 ENCOUNTER — Telehealth: Payer: Self-pay | Admitting: Family Medicine

## 2008-10-25 ENCOUNTER — Ambulatory Visit: Payer: Self-pay | Admitting: Cardiology

## 2008-10-30 ENCOUNTER — Ambulatory Visit: Payer: Self-pay | Admitting: Pulmonary Disease

## 2008-11-01 ENCOUNTER — Ambulatory Visit: Payer: Self-pay | Admitting: Family Medicine

## 2008-11-01 DIAGNOSIS — L509 Urticaria, unspecified: Secondary | ICD-10-CM | POA: Insufficient documentation

## 2008-11-29 ENCOUNTER — Ambulatory Visit: Payer: Self-pay | Admitting: Internal Medicine

## 2008-12-07 ENCOUNTER — Ambulatory Visit (HOSPITAL_COMMUNITY): Admission: RE | Admit: 2008-12-07 | Discharge: 2008-12-07 | Payer: Self-pay | Admitting: Urology

## 2008-12-15 ENCOUNTER — Encounter: Payer: Self-pay | Admitting: Internal Medicine

## 2008-12-18 ENCOUNTER — Ambulatory Visit: Payer: Self-pay | Admitting: Pulmonary Disease

## 2008-12-21 ENCOUNTER — Encounter: Payer: Self-pay | Admitting: Pulmonary Disease

## 2008-12-27 ENCOUNTER — Encounter: Payer: Self-pay | Admitting: Pulmonary Disease

## 2008-12-28 ENCOUNTER — Encounter: Payer: Self-pay | Admitting: Pulmonary Disease

## 2008-12-29 ENCOUNTER — Encounter: Payer: Self-pay | Admitting: Family Medicine

## 2009-01-08 ENCOUNTER — Encounter: Payer: Self-pay | Admitting: Pulmonary Disease

## 2009-01-20 DIAGNOSIS — M2550 Pain in unspecified joint: Secondary | ICD-10-CM

## 2009-01-20 DIAGNOSIS — I359 Nonrheumatic aortic valve disorder, unspecified: Secondary | ICD-10-CM | POA: Insufficient documentation

## 2009-01-20 DIAGNOSIS — C649 Malignant neoplasm of unspecified kidney, except renal pelvis: Secondary | ICD-10-CM | POA: Insufficient documentation

## 2009-01-20 DIAGNOSIS — I442 Atrioventricular block, complete: Secondary | ICD-10-CM

## 2009-01-20 DIAGNOSIS — R0989 Other specified symptoms and signs involving the circulatory and respiratory systems: Secondary | ICD-10-CM

## 2009-01-26 ENCOUNTER — Ambulatory Visit: Payer: Self-pay | Admitting: Pulmonary Disease

## 2009-02-01 ENCOUNTER — Ambulatory Visit: Payer: Self-pay | Admitting: Pulmonary Disease

## 2009-02-09 ENCOUNTER — Encounter (INDEPENDENT_AMBULATORY_CARE_PROVIDER_SITE_OTHER): Payer: Self-pay | Admitting: *Deleted

## 2009-02-09 ENCOUNTER — Ambulatory Visit: Payer: Self-pay | Admitting: Cardiology

## 2009-02-09 DIAGNOSIS — I6529 Occlusion and stenosis of unspecified carotid artery: Secondary | ICD-10-CM

## 2009-02-09 DIAGNOSIS — R079 Chest pain, unspecified: Secondary | ICD-10-CM

## 2009-02-16 ENCOUNTER — Ambulatory Visit: Payer: Self-pay | Admitting: Internal Medicine

## 2009-02-16 LAB — CONVERTED CEMR LAB
HDL goal, serum: 40 mg/dL
LDL Goal: 100 mg/dL

## 2009-02-28 ENCOUNTER — Ambulatory Visit: Payer: Self-pay | Admitting: Cardiovascular Disease

## 2009-03-02 LAB — CONVERTED CEMR LAB
BUN: 23 mg/dL (ref 6–23)
CO2: 33 meq/L — ABNORMAL HIGH (ref 19–32)
Calcium: 9.4 mg/dL (ref 8.4–10.5)
Chloride: 99 meq/L (ref 96–112)
Creatinine, Ser: 1.3 mg/dL (ref 0.4–1.5)

## 2009-03-13 ENCOUNTER — Ambulatory Visit: Payer: Self-pay | Admitting: Internal Medicine

## 2009-03-16 ENCOUNTER — Ambulatory Visit: Payer: Self-pay | Admitting: Pulmonary Disease

## 2009-03-19 ENCOUNTER — Encounter: Payer: Self-pay | Admitting: Cardiology

## 2009-04-19 ENCOUNTER — Telehealth (INDEPENDENT_AMBULATORY_CARE_PROVIDER_SITE_OTHER): Payer: Self-pay | Admitting: *Deleted

## 2009-04-19 ENCOUNTER — Ambulatory Visit: Payer: Self-pay | Admitting: Pulmonary Disease

## 2009-05-01 ENCOUNTER — Encounter: Payer: Self-pay | Admitting: Family Medicine

## 2009-05-21 ENCOUNTER — Ambulatory Visit: Payer: Self-pay | Admitting: Cardiovascular Disease

## 2009-05-21 ENCOUNTER — Ambulatory Visit: Payer: Self-pay | Admitting: Pulmonary Disease

## 2009-05-21 LAB — CONVERTED CEMR LAB
BUN: 20 mg/dL (ref 6–23)
Calcium: 9.5 mg/dL (ref 8.4–10.5)
GFR calc non Af Amer: 51.26 mL/min (ref >60)
Glucose, Bld: 108 mg/dL — ABNORMAL HIGH (ref 70–99)
Potassium: 3.7 meq/L (ref 3.5–5.1)

## 2009-05-23 ENCOUNTER — Telehealth (INDEPENDENT_AMBULATORY_CARE_PROVIDER_SITE_OTHER): Payer: Self-pay | Admitting: *Deleted

## 2009-06-04 ENCOUNTER — Encounter (INDEPENDENT_AMBULATORY_CARE_PROVIDER_SITE_OTHER): Payer: Self-pay | Admitting: *Deleted

## 2009-06-18 ENCOUNTER — Ambulatory Visit: Payer: Self-pay | Admitting: Pulmonary Disease

## 2009-06-20 ENCOUNTER — Ambulatory Visit: Payer: Self-pay | Admitting: Internal Medicine

## 2009-06-25 ENCOUNTER — Encounter: Payer: Self-pay | Admitting: Cardiology

## 2009-06-25 ENCOUNTER — Encounter: Payer: Self-pay | Admitting: Pulmonary Disease

## 2009-06-26 LAB — CONVERTED CEMR LAB
ALT: 17 units/L (ref 0–53)
AST: 16 units/L (ref 0–37)
Alkaline Phosphatase: 71 units/L (ref 39–117)
Basophils Relative: 0.8 % (ref 0.0–3.0)
Bilirubin, Direct: 0.1 mg/dL (ref 0.0–0.3)
CO2: 24 meq/L
Calcium: 9.2 mg/dL (ref 8.4–10.5)
Creatinine, Ser: 1.4 mg/dL (ref 0.4–1.5)
Creatinine, Ser: 1.52 mg/dL
Eosinophils Absolute: 0.1 10*3/uL (ref 0.0–0.7)
Eosinophils Relative: 0.7 % (ref 0.0–5.0)
GFR calc non Af Amer: 51.25 mL/min (ref >60)
Hemoglobin: 13.1 g/dL
Hemoglobin: 13.9 g/dL (ref 13.0–17.0)
Lymphocytes Relative: 3.7 % — ABNORMAL LOW (ref 12.0–46.0)
MCV: 92 fL
Monocytes Relative: 3.1 % (ref 3.0–12.0)
Neutro Abs: 7.7 10*3/uL (ref 1.4–7.7)
Neutrophils Relative %: 91.7 % — ABNORMAL HIGH (ref 43.0–77.0)
Platelets: 288 10*3/uL
Potassium: 4.1 meq/L
Pro B Natriuretic peptide (BNP): 99 pg/mL (ref 0.0–100.0)
RBC: 4.35 M/uL (ref 4.22–5.81)
RDW: 14.4 %
Sodium: 138 meq/L (ref 135–145)
Sodium: 141 meq/L
Total Protein: 6.5 g/dL (ref 6.0–8.3)
WBC: 8.5 10*3/uL (ref 4.5–10.5)

## 2009-07-06 ENCOUNTER — Ambulatory Visit: Payer: Self-pay | Admitting: Family Medicine

## 2009-07-09 ENCOUNTER — Ambulatory Visit: Payer: Self-pay | Admitting: Pulmonary Disease

## 2009-07-09 ENCOUNTER — Ambulatory Visit: Payer: Self-pay | Admitting: Cardiology

## 2009-07-11 ENCOUNTER — Encounter: Payer: Self-pay | Admitting: Pulmonary Disease

## 2009-07-18 ENCOUNTER — Ambulatory Visit: Payer: Self-pay | Admitting: Cardiology

## 2009-07-19 LAB — CONVERTED CEMR LAB
BUN: 18 mg/dL (ref 6–23)
Creatinine, Ser: 1.4 mg/dL (ref 0.4–1.5)
GFR calc non Af Amer: 51.24 mL/min (ref >60)
Potassium: 3.5 meq/L (ref 3.5–5.1)
Pro B Natriuretic peptide (BNP): 85 pg/mL (ref 0.0–100.0)

## 2009-08-14 ENCOUNTER — Ambulatory Visit: Payer: Self-pay | Admitting: Pulmonary Disease

## 2009-08-15 ENCOUNTER — Telehealth: Payer: Self-pay | Admitting: Pulmonary Disease

## 2009-08-15 LAB — CONVERTED CEMR LAB
ALT: 13 units/L (ref 0–53)
Alkaline Phosphatase: 74 units/L (ref 39–117)
Basophils Relative: 0.6 % (ref 0.0–3.0)
Bilirubin, Direct: 0.1 mg/dL (ref 0.0–0.3)
Calcium: 9.7 mg/dL (ref 8.4–10.5)
Chloride: 99 meq/L (ref 96–112)
Creatinine, Ser: 1.5 mg/dL (ref 0.4–1.5)
Eosinophils Relative: 3.8 % (ref 0.0–5.0)
Lymphocytes Relative: 7.5 % — ABNORMAL LOW (ref 12.0–46.0)
Monocytes Relative: 7.8 % (ref 3.0–12.0)
Neutrophils Relative %: 80.3 % — ABNORMAL HIGH (ref 43.0–77.0)
RBC: 4 M/uL — ABNORMAL LOW (ref 4.22–5.81)
Total Protein: 7 g/dL (ref 6.0–8.3)
WBC: 9.5 10*3/uL (ref 4.5–10.5)

## 2009-08-16 ENCOUNTER — Ambulatory Visit: Payer: Self-pay | Admitting: Cardiology

## 2009-09-17 ENCOUNTER — Ambulatory Visit: Payer: Self-pay | Admitting: Pulmonary Disease

## 2009-09-17 DIAGNOSIS — M549 Dorsalgia, unspecified: Secondary | ICD-10-CM | POA: Insufficient documentation

## 2009-09-19 LAB — CONVERTED CEMR LAB
ALT: 13 units/L (ref 0–53)
BUN: 16 mg/dL (ref 6–23)
Bilirubin, Direct: 0.2 mg/dL (ref 0.0–0.3)
Chloride: 100 meq/L (ref 96–112)
Creatinine, Ser: 1.5 mg/dL (ref 0.4–1.5)
Eosinophils Relative: 3.6 % (ref 0.0–5.0)
GFR calc non Af Amer: 47.3 mL/min (ref >60)
MCV: 98.7 fL (ref 78.0–100.0)
Monocytes Absolute: 0.3 10*3/uL (ref 0.1–1.0)
Neutrophils Relative %: 86.1 % — ABNORMAL HIGH (ref 43.0–77.0)
Platelets: 266 10*3/uL (ref 150.0–400.0)
Total Bilirubin: 1 mg/dL (ref 0.3–1.2)
WBC: 7.7 10*3/uL (ref 4.5–10.5)

## 2009-09-25 ENCOUNTER — Ambulatory Visit: Payer: Self-pay | Admitting: Internal Medicine

## 2009-10-10 ENCOUNTER — Encounter: Payer: Self-pay | Admitting: Internal Medicine

## 2009-10-16 ENCOUNTER — Ambulatory Visit: Payer: Self-pay | Admitting: Pulmonary Disease

## 2009-10-17 ENCOUNTER — Ambulatory Visit: Payer: Self-pay | Admitting: Cardiology

## 2009-10-19 ENCOUNTER — Telehealth: Payer: Self-pay | Admitting: Cardiology

## 2009-10-26 ENCOUNTER — Ambulatory Visit: Payer: Self-pay | Admitting: Cardiology

## 2009-10-29 LAB — CONVERTED CEMR LAB
CO2: 30 meq/L (ref 19–32)
Chloride: 102 meq/L (ref 96–112)
Glucose, Bld: 96 mg/dL (ref 70–99)
Potassium: 4.3 meq/L (ref 3.5–5.1)
Sodium: 138 meq/L (ref 135–145)

## 2009-11-15 ENCOUNTER — Emergency Department (HOSPITAL_BASED_OUTPATIENT_CLINIC_OR_DEPARTMENT_OTHER): Admission: EM | Admit: 2009-11-15 | Discharge: 2009-11-16 | Payer: Self-pay | Admitting: Emergency Medicine

## 2009-11-16 ENCOUNTER — Ambulatory Visit: Payer: Self-pay | Admitting: Diagnostic Radiology

## 2009-11-30 ENCOUNTER — Ambulatory Visit: Payer: Self-pay | Admitting: Pulmonary Disease

## 2009-12-05 ENCOUNTER — Encounter: Payer: Self-pay | Admitting: Family Medicine

## 2009-12-26 ENCOUNTER — Ambulatory Visit: Payer: Self-pay | Admitting: Internal Medicine

## 2009-12-27 ENCOUNTER — Encounter: Payer: Self-pay | Admitting: Internal Medicine

## 2010-01-03 ENCOUNTER — Encounter: Payer: Self-pay | Admitting: Emergency Medicine

## 2010-01-03 ENCOUNTER — Observation Stay (HOSPITAL_COMMUNITY): Admission: EM | Admit: 2010-01-03 | Discharge: 2010-01-04 | Payer: Self-pay | Admitting: Internal Medicine

## 2010-01-03 ENCOUNTER — Ambulatory Visit: Payer: Self-pay | Admitting: Diagnostic Radiology

## 2010-01-04 ENCOUNTER — Encounter: Payer: Self-pay | Admitting: Internal Medicine

## 2010-01-18 ENCOUNTER — Ambulatory Visit: Payer: Self-pay | Admitting: Cardiology

## 2010-01-21 ENCOUNTER — Encounter (INDEPENDENT_AMBULATORY_CARE_PROVIDER_SITE_OTHER): Payer: Self-pay | Admitting: *Deleted

## 2010-01-23 ENCOUNTER — Telehealth (INDEPENDENT_AMBULATORY_CARE_PROVIDER_SITE_OTHER): Payer: Self-pay | Admitting: *Deleted

## 2010-01-24 ENCOUNTER — Encounter (HOSPITAL_COMMUNITY): Admission: RE | Admit: 2010-01-24 | Discharge: 2010-04-09 | Payer: Self-pay | Admitting: Cardiology

## 2010-01-24 ENCOUNTER — Ambulatory Visit: Payer: Self-pay

## 2010-01-24 ENCOUNTER — Ambulatory Visit: Payer: Self-pay | Admitting: Internal Medicine

## 2010-01-29 ENCOUNTER — Ambulatory Visit: Payer: Self-pay | Admitting: Pulmonary Disease

## 2010-02-15 ENCOUNTER — Telehealth: Payer: Self-pay | Admitting: Cardiology

## 2010-02-28 ENCOUNTER — Ambulatory Visit: Payer: Self-pay | Admitting: Pulmonary Disease

## 2010-03-14 ENCOUNTER — Encounter: Payer: Self-pay | Admitting: Internal Medicine

## 2010-03-14 ENCOUNTER — Telehealth (INDEPENDENT_AMBULATORY_CARE_PROVIDER_SITE_OTHER): Payer: Self-pay | Admitting: *Deleted

## 2010-03-15 ENCOUNTER — Ambulatory Visit: Payer: Self-pay | Admitting: Internal Medicine

## 2010-03-21 ENCOUNTER — Encounter: Payer: Self-pay | Admitting: Pulmonary Disease

## 2010-03-28 ENCOUNTER — Ambulatory Visit: Payer: Self-pay | Admitting: Pulmonary Disease

## 2010-04-17 ENCOUNTER — Ambulatory Visit: Payer: Self-pay | Admitting: Cardiology

## 2010-04-25 ENCOUNTER — Ambulatory Visit: Payer: Self-pay | Admitting: Pulmonary Disease

## 2010-04-26 ENCOUNTER — Telehealth (INDEPENDENT_AMBULATORY_CARE_PROVIDER_SITE_OTHER): Payer: Self-pay | Admitting: *Deleted

## 2010-05-06 ENCOUNTER — Ambulatory Visit: Payer: Self-pay | Admitting: Pulmonary Disease

## 2010-05-07 LAB — CONVERTED CEMR LAB
AST: 14 units/L (ref 0–37)
Alkaline Phosphatase: 76 units/L (ref 39–117)
BUN: 16 mg/dL (ref 6–23)
Basophils Relative: 1.3 % (ref 0.0–3.0)
Bilirubin, Direct: 0.2 mg/dL (ref 0.0–0.3)
Chloride: 99 meq/L (ref 96–112)
Eosinophils Relative: 11 % — ABNORMAL HIGH (ref 0.0–5.0)
Glucose, Bld: 97 mg/dL (ref 70–99)
Lymphocytes Relative: 13.7 % (ref 12.0–46.0)
MCV: 94.8 fL (ref 78.0–100.0)
Neutrophils Relative %: 63.1 % (ref 43.0–77.0)
Potassium: 3.9 meq/L (ref 3.5–5.1)
RBC: 4.04 M/uL — ABNORMAL LOW (ref 4.22–5.81)
Total Protein: 6.6 g/dL (ref 6.0–8.3)
WBC: 6.9 10*3/uL (ref 4.5–10.5)

## 2010-06-20 ENCOUNTER — Ambulatory Visit: Payer: Self-pay | Admitting: Internal Medicine

## 2010-06-27 ENCOUNTER — Ambulatory Visit: Payer: Self-pay | Admitting: Pulmonary Disease

## 2010-07-12 ENCOUNTER — Encounter (INDEPENDENT_AMBULATORY_CARE_PROVIDER_SITE_OTHER): Payer: Self-pay | Admitting: *Deleted

## 2010-09-12 ENCOUNTER — Other Ambulatory Visit: Payer: Self-pay | Admitting: Pulmonary Disease

## 2010-09-12 ENCOUNTER — Ambulatory Visit
Admission: RE | Admit: 2010-09-12 | Discharge: 2010-09-12 | Payer: Self-pay | Source: Home / Self Care | Attending: Pulmonary Disease | Admitting: Pulmonary Disease

## 2010-09-12 ENCOUNTER — Telehealth: Payer: Self-pay | Admitting: Pulmonary Disease

## 2010-09-12 LAB — HEPATIC FUNCTION PANEL
ALT: 20 U/L (ref 0–53)
AST: 22 U/L (ref 0–37)
Albumin: 3.9 g/dL (ref 3.5–5.2)
Alkaline Phosphatase: 71 U/L (ref 39–117)
Bilirubin, Direct: 0.1 mg/dL (ref 0.0–0.3)
Total Bilirubin: 0.9 mg/dL (ref 0.3–1.2)
Total Protein: 6.5 g/dL (ref 6.0–8.3)

## 2010-09-12 LAB — CBC WITH DIFFERENTIAL/PLATELET
Basophils Absolute: 0.1 10*3/uL (ref 0.0–0.1)
Basophils Relative: 1.6 % (ref 0.0–3.0)
Eosinophils Absolute: 0.4 10*3/uL (ref 0.0–0.7)
Eosinophils Relative: 7 % — ABNORMAL HIGH (ref 0.0–5.0)
HCT: 38.2 % — ABNORMAL LOW (ref 39.0–52.0)
Hemoglobin: 13.2 g/dL (ref 13.0–17.0)
Lymphocytes Relative: 13.9 % (ref 12.0–46.0)
Lymphs Abs: 0.8 10*3/uL (ref 0.7–4.0)
MCHC: 34.7 g/dL (ref 30.0–36.0)
MCV: 95.7 fl (ref 78.0–100.0)
Monocytes Absolute: 0.6 10*3/uL (ref 0.1–1.0)
Monocytes Relative: 9.5 % (ref 3.0–12.0)
Neutro Abs: 4 10*3/uL (ref 1.4–7.7)
Neutrophils Relative %: 68 % (ref 43.0–77.0)
Platelets: 262 10*3/uL (ref 150.0–400.0)
RBC: 3.99 Mil/uL — ABNORMAL LOW (ref 4.22–5.81)
RDW: 14.8 % — ABNORMAL HIGH (ref 11.5–14.6)
WBC: 5.8 10*3/uL (ref 4.5–10.5)

## 2010-09-12 LAB — BASIC METABOLIC PANEL
BUN: 23 mg/dL (ref 6–23)
CO2: 34 mEq/L — ABNORMAL HIGH (ref 19–32)
Calcium: 9.1 mg/dL (ref 8.4–10.5)
Chloride: 98 mEq/L (ref 96–112)
Creatinine, Ser: 1.5 mg/dL (ref 0.4–1.5)
GFR: 45.78 mL/min — ABNORMAL LOW (ref >60.00)
Glucose, Bld: 97 mg/dL (ref 70–99)
Potassium: 3.7 mEq/L (ref 3.5–5.1)
Sodium: 141 mEq/L (ref 135–145)

## 2010-09-19 ENCOUNTER — Encounter: Payer: Self-pay | Admitting: Internal Medicine

## 2010-09-19 ENCOUNTER — Ambulatory Visit
Admission: RE | Admit: 2010-09-19 | Discharge: 2010-09-19 | Payer: Self-pay | Source: Home / Self Care | Attending: Internal Medicine | Admitting: Internal Medicine

## 2010-10-02 ENCOUNTER — Ambulatory Visit
Admission: RE | Admit: 2010-10-02 | Discharge: 2010-10-02 | Payer: Self-pay | Source: Home / Self Care | Attending: Pulmonary Disease | Admitting: Pulmonary Disease

## 2010-10-02 DIAGNOSIS — J069 Acute upper respiratory infection, unspecified: Secondary | ICD-10-CM | POA: Insufficient documentation

## 2010-10-06 LAB — CONVERTED CEMR LAB
ALT: 17 units/L (ref 0–53)
AST: 18 units/L (ref 0–37)
Albumin: 4.1 g/dL (ref 3.5–5.2)
Alkaline Phosphatase: 90 units/L (ref 39–117)
BUN: 24 mg/dL — ABNORMAL HIGH (ref 6–23)
Bilirubin, Direct: 0 mg/dL (ref 0.0–0.3)
Bilirubin, Direct: 0.2 mg/dL (ref 0.0–0.3)
Bilirubin, Direct: 0.2 mg/dL (ref 0.0–0.3)
CO2: 31 meq/L (ref 19–32)
CO2: 32 meq/L (ref 19–32)
Calcium: 9.3 mg/dL (ref 8.4–10.5)
Chloride: 100 meq/L (ref 96–112)
Chloride: 102 meq/L (ref 96–112)
Chloride: 108 meq/L (ref 96–112)
Cholesterol: 167 mg/dL (ref 0–200)
Creatinine, Ser: 1.4 mg/dL (ref 0.4–1.5)
GFR calc non Af Amer: 55.88 mL/min (ref >60)
Glucose, Bld: 93 mg/dL (ref 70–99)
HDL: 46 mg/dL (ref >39.00)
LDL Cholesterol: 119 mg/dL — ABNORMAL HIGH (ref 0–99)
LDL Cholesterol: 87 mg/dL (ref 0–99)
Potassium: 3.2 meq/L — ABNORMAL LOW (ref 3.5–5.1)
Potassium: 3.3 meq/L — ABNORMAL LOW (ref 3.5–5.1)
Potassium: 3.7 meq/L (ref 3.5–5.1)
Sodium: 142 meq/L (ref 135–145)
Sodium: 144 meq/L (ref 135–145)
Total Bilirubin: 0.7 mg/dL (ref 0.3–1.2)
Total Bilirubin: 1.2 mg/dL (ref 0.3–1.2)
Total CHOL/HDL Ratio: 3
Total CHOL/HDL Ratio: 4
Total CHOL/HDL Ratio: 5
Triglycerides: 106 mg/dL (ref 0.0–149.0)
VLDL: 21.2 mg/dL (ref 0.0–40.0)
VLDL: 24.2 mg/dL (ref 0.0–40.0)
VLDL: 30.8 mg/dL (ref 0.0–40.0)

## 2010-10-08 NOTE — Cardiovascular Report (Signed)
Summary: Office Visit   Office Visit   Imported By: Roderic Ovens 03/18/2010 10:58:19  _____________________________________________________________________  External Attachment:    Type:   Image     Comment:   External Document

## 2010-10-08 NOTE — Assessment & Plan Note (Signed)
Summary: eph/ appt is 11:30/ gd    Referring Provider:  Olga Millers Primary Provider:  Gershon Crane  CC:  sob.  History of Present Illness: [name] is a pleasant gentleman who has a history of coronary artery disease status post coronary bypass and graft, ischemic cardiomyopathy, combined systolic/diastolic heart failure, and history of pacemaker placement.  He also has pulmonary fibrosis and vasculitis.  His most recent Myoview was performed on April 27, 2007.  At that time, the ejection fraction was 40%.  There was a prior inferior infarct and trivial peri-infarct ischemia. An echocardiogram performed on March 13, 2008 showed an ejection fraction of 50%. There was mild aortic insufficiency. He was recently admitted for chest pain.  He ruled out for myocardial infarction with serial enzymes. Since then he continues to have dyspnea on exertion relieved with rest. There is no orthopnea or PND. His pedal edema is baseline. He denies any exertional chest pain. However he has episodes of tightness. This is diffuse in nature on his chest. There is associated shortness of breath. He feels "like I can't get a good breath". It lasts 30 minutes to one hour typically. It is not related to exertion. He takes both nitroglycerin and Xanax for this.  Current Medications (verified): 1)  Methotrexate 2.5 Mg Tabs (Methotrexate Sodium) .... 4 Pills Once Per Week 2)  Folic Acid 1 Mg Tabs (Folic Acid) .... One By Mouth Once Daily 3)  Carvedilol 12.5 Mg  Tabs (Carvedilol) .Marland Kitchen.. 1 Tab By Mouth Two Times A Day 4)  Lipitor 80 Mg Tabs (Atorvastatin Calcium) .Marland Kitchen.. 1 By Mouth Once Daily 5)  Potassium Chloride Crys Cr 20 Meq Cr-Tabs (Potassium Chloride Crys Cr) .... 2 Tabs Morning and 1 Tablet in The Afternoon 6)  Coq10 100 Mg  Caps (Coenzyme Q10) .Marland Kitchen.. 1 Once Daily 7)  Multivitamins   Tabs (Multiple Vitamin) .... Once Daily 8)  Furosemide 40 Mg Tabs (Furosemide) .... Three Tabs By Mouth Two Times A Day 9)  Calcium 600-D  600-400 Mg-Unit Tabs (Calcium Carbonate-Vitamin D) .... 2 By Mouth Daily 10)  Bipap .... At Bedtime 11)  Oxygen 4 L At Rest and  5 L Exertion 12)  Alprazolam 0.25 Mg Tabs (Alprazolam) .... One By Mouth Three Times A Day As Needed 13)  Nasonex 50 Mcg/act  Susp (Mometasone Furoate) .... Two Puffs Each Nostril Daily As Needed 14)  Proair Hfa 108 (90 Base) Mcg/act Aers (Albuterol Sulfate) .... Two Puffs Up To Four Times Per Day As Needed 15)  Nitrostat 0.4 Mg Subl (Nitroglycerin) .Marland Kitchen.. 1 Tablet Under Tongue At Onset of Chest Pain; You May Repeat Every 5 Minutes For Up To 3 Doses. 16)  Aspirin 81 Mg Tbec (Aspirin) .... Take One Tablet By Mouth Daily  Allergies: 1)  ! Penicillin 2)  ! * Zetia 3)  ! Levaquin 4)  ! Cipro  Past History:  Past Medical History: Reviewed history from 10/17/2009 and no changes required. 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 Coralyn Helling      - 03/02 P-ANCA > 1:640, Myeloperoxidase Ab 17.6, ANA negative, RPR negative, RF < 20      - 07/09 ANCA < 1:20      - PFT 07/09 FEV1 1.90 (81%), FVC 2.66 (71%), TLC 5.08 (87%), DLCO 42%      - MTX started 06/18/09 Community acquired pneumonia Chronic hypoxemic respiratory failure      - 4 liters oxygen  Chronic  rhinitis Prostate cancer s/p external radiation and hormonal therapy      - Dr. 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 Olga Millers                    - Echo 07/09 EF 50% Coronary artery disease Ventricular Tachycardia Second degree AV block, Mobitz Type II Mild Aortic regurgitation Hypertension Hyperlipidemia Anxiety Osteoporosis      - T12, L1, L2 compression fracture Obstructive sleep apnea      - PSG 01/08 AHI 11      - BPAP 9/5 cm H2O Right elbow bursitis Gout Thyroid cyst GERD  Past Surgical History: Reviewed history from 12/18/2008 and no  changes required. CABG 02/01      - Dr. Sheliah Plane Left partial nephrectomy 04/02      - Dr. Jethro Bolus TURP Septoplasty Right tympanoplasty Left tympanic implant Pacemaker insertion 12/07 Kyphoplasty 04/09     - Dr. Colon Branch  Social History: Reviewed history from 12/18/2008 and no changes required. Widowed.  Three children.  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  Occasional alcohol use.   Review of Systems       no fevers or chills, productive cough, hemoptysis, dysphasia, odynophagia, melena, hematochezia, dysuria, hematuria, rash, seizure activity, orthopnea, PND,  claudication. Remaining systems are negative.   Vital Signs:  Patient profile:   77 year old male Height:      65 inches Weight:      172 pounds BMI:     28.73 Pulse rate:   75 / minute Resp:     12 per minute BP sitting:   125 / 70  (left arm)  Vitals Entered By: Kem Parkinson (Jan 18, 2010 11:09 AM)  Physical Exam  General:  Well-developed well-nourished in no acute distress.  Skin is warm and dry.  HEENT is significant for prosthetic left eye. Neck is supple. No thyromegaly.  Chest is significant for basilar crackles bilaterally. Cardiovascular exam is regular rate and rhythm.  Abdominal exam nontender or distended. No masses palpated. Extremities show trace edema. neuro grossly intact    EKG  Procedure date:  01/18/2010  Findings:      Atrial paced rhythm, left anterior fascicular block, prior anterior infarct.  PPM Specifications Following MD:  Sherryl Manges, MD     Referring MD:  Cecille Po Vendor:  [employer]     PPM Model Number:  [id number]     PPM Serial Number:  [id number] PPM DOI:  08/11/2006     PPM Implanting MD:  Sherryl Manges, MD  Lead 1    Location: [facility]     DOI: 08/11/2006     Model #: [id number]     Serial #: [id number]     Status: active Lead 2    Location: [facility]     DOI: 08/11/2006     Model #: [id number]     Serial #: [id number]     Status: active   Indications:   MOBITZ II   PPM Follow Up Pacer Dependent:  No      Episodes Coumadin:  No  Parameters Mode:  DDD+     Lower Rate Limit:  75     Upper Rate Limit:  130 Paced AV Delay:  180     Sensed AV Delay:  150  Impression & Recommendations:  Problem # 1:  CHEST PAIN (ICD-786.50)  Symptoms have both typical and atypical features. I had long discussions with he and his wife today. He has multiple comorbidities including pulmonary fibrosis and renal insufficiency. We will plan to proceed with a Myoview for risk stratification. If abnormal and he may require cardiac catheterization to see if there is a lesion amenable to PCI. His updated medication list for this problem includes:    Carvedilol 12.5 Mg Tabs (Carvedilol) .Marland Kitchen... 1 tab by mouth two times a day    Nitrostat 0.4 Mg Subl (Nitroglycerin) .Marland Kitchen... 1 tablet under tongue at onset of chest pain; you may repeat every 5 minutes for up to 3 doses.    Aspirin 81 Mg Tbec (Aspirin) .Marland Kitchen... Take one tablet by mouth daily  His updated medication list for this problem includes:    Carvedilol 12.5 Mg Tabs (Carvedilol) .Marland Kitchen... 1 tab by mouth two times a day    Nitrostat 0.4 Mg Subl (Nitroglycerin) .Marland Kitchen... 1 tablet under tongue at onset of chest pain; you may repeat every 5 minutes for up to 3 doses.    Aspirin 81 Mg Tbec (Aspirin) .Marland Kitchen... Take one tablet by mouth daily  Problem # 2:  DYSPNEA (ICD-786.05)  Most likely secondary to pulmonary fibrosis. His updated medication list for this problem includes:    Carvedilol 12.5 Mg Tabs (Carvedilol) .Marland Kitchen... 1 tab by mouth two times a day    Furosemide 40 Mg Tabs (Furosemide) .Marland Kitchen... Three tabs by mouth two times a day    Aspirin 81 Mg Tbec (Aspirin) .Marland Kitchen... Take one tablet by mouth daily  Orders: Nuclear Stress Test (Nuc Stress Test)  His updated medication list for this problem includes:    Carvedilol 12.5 Mg Tabs (Carvedilol) .Marland Kitchen... 1 tab by mouth two times a day    Furosemide 40 Mg Tabs (Furosemide) .Marland Kitchen... Three tabs by  mouth two times a day    Aspirin 81 Mg Tbec (Aspirin) .Marland Kitchen... Take one tablet by mouth daily  Problem # 3:  CAD, S/P CABG EF 45% (ICD-414.00)  Continue aspirin, beta blocker and statin. His updated medication list for this problem includes:    Carvedilol 12.5 Mg Tabs (Carvedilol) .Marland Kitchen... 1 tab by mouth two times a day    Nitrostat 0.4 Mg Subl (Nitroglycerin) .Marland Kitchen... 1 tablet under tongue at onset of chest pain; you may repeat every 5 minutes for up to 3 doses.    Aspirin 81 Mg Tbec (Aspirin) .Marland Kitchen... Take one tablet by mouth daily  His updated medication list for this problem includes:    Carvedilol 12.5 Mg Tabs (Carvedilol) .Marland Kitchen... 1 tab by mouth two times a day    Nitrostat 0.4 Mg Subl (Nitroglycerin) .Marland Kitchen... 1 tablet under tongue at onset of chest pain; you may repeat every 5 minutes for up to 3 doses.    Aspirin 81 Mg Tbec (Aspirin) .Marland Kitchen... Take one tablet by mouth daily  Problem # 4:  PACEMAKER, PERMANENT DDD MDT (ICD-V45.01) Management per EP.  Problem # 5:  HYPERTENSION (ICD-401.9)  Blood pressure controlled. Check bmet. His updated medication list for this problem includes:    Carvedilol 12.5 Mg Tabs (Carvedilol) .Marland Kitchen... 1 tab by mouth two times a day    Furosemide 40 Mg Tabs (Furosemide) .Marland Kitchen... Three tabs by mouth two times a day    Aspirin 81 Mg Tbec (Aspirin) .Marland Kitchen... Take one tablet by mouth daily  Orders: TLB-BMP (Basic Metabolic Panel-BMET) (80048-METABOL)  His updated medication list for this problem  includes:    Carvedilol 12.5 Mg Tabs (Carvedilol) .Marland Kitchen... 1 tab by mouth two times a day    Furosemide 40 Mg Tabs (Furosemide) .Marland Kitchen... Three tabs by mouth two times a day    Aspirin 81 Mg Tbec (Aspirin) .Marland Kitchen... Take one tablet by mouth daily  Problem # 6:  HYPERLIPIDEMIA (ICD-272.4)  Continue statin. Check lipids and liver. His updated medication list for this problem includes:    Lipitor 80 Mg Tabs (Atorvastatin calcium) .Marland Kitchen... 1 by mouth once daily  Orders: TLB-Lipid Panel  (80061-LIPID) TLB-Hepatic/Liver Function Pnl (80076-HEPATIC)  His updated medication list for this problem includes:    Lipitor 80 Mg Tabs (Atorvastatin calcium) .Marland Kitchen... 1 by mouth once daily  Problem # 7:  OBSTRUCTIVE SLEEP APNEA (ICD-327.23)  Problem # 8:  POSTINFLAMMATORY PULMONARY FIBROSIS (ICD-515) Management per pulmonary.  Problem # 9:  COMBINED HEART FAILURE, CHRONIC (ICD-428.42) Continue present dose of diuretics.euvolemic on examination. His updated medication list for this problem includes:    Carvedilol 12.5 Mg Tabs (Carvedilol) .Marland Kitchen... 1 tab by mouth two times a day    Furosemide 40 Mg Tabs (Furosemide) .Marland Kitchen... Three tabs by mouth two times a day    Nitrostat 0.4 Mg Subl (Nitroglycerin) .Marland Kitchen... 1 tablet under tongue at onset of chest pain; you may repeat every 5 minutes for up to 3 doses.    Aspirin 81 Mg Tbec (Aspirin) .Marland Kitchen... Take one tablet by mouth daily  His updated medication list for this problem includes:    Carvedilol 12.5 Mg Tabs (Carvedilol) .Marland Kitchen... 1 tab by mouth two times a day    Furosemide 40 Mg Tabs (Furosemide) .Marland Kitchen... Three tabs by mouth two times a day    Nitrostat 0.4 Mg Subl (Nitroglycerin) .Marland Kitchen... 1 tablet under tongue at onset of chest pain; you may repeat every 5 minutes for up to 3 doses.    Aspirin 81 Mg Tbec (Aspirin) .Marland Kitchen... Take one tablet by mouth daily  Patient Instructions: 1)  Your physician recommends that you schedule a follow-up appointment in: 3 MONTHS 2)  Your physician has requested that you have an exercise stress myoview.  For further information please visit [email or web address]  Please follow instruction sheet, as given.

## 2010-10-08 NOTE — Assessment & Plan Note (Signed)
Summary: rov 4-6 wks ///kp   Copy to:  Olga Millers Primary Provider/Referring Provider:  Gershon Crane  CC:  Pulmonary hypertension follow-up. The patient c/o increased sob and feels like his breathing is labored with exertion.Marland Kitchen  History of Present Illness: 77  year old male with  known history of  pauci-immune vasculitis with pulmonary fibrosis, hypoxemia, and sleep apnea on nocturnal  BIPAP 9/5.  He has lost about 10 lbs since his prednisone dose was decreased.  He does not have as much of an appetite as before.  His breathing is about the same.  He has some cough, but not much sputum.  He denies fever.  He has some leg swelling, but no worse than usual.  He has not problem using his BPAP.   Current Medications (verified): 1)  Prednisone 5 Mg Tabs (Prednisone) .Marland Kitchen.. 1 By Mouth Every Other Day 2)  Methotrexate 2.5 Mg Tabs (Methotrexate Sodium) .... 4 Pills Once Per Week 3)  Folic Acid 1 Mg Tabs (Folic Acid) .... One By Mouth Once Daily 4)  Carvedilol 12.5 Mg  Tabs (Carvedilol) .Marland Kitchen.. 1 Tab By Mouth Two Times A Day 5)  Lipitor 80 Mg Tabs (Atorvastatin Calcium) .Marland Kitchen.. 1 By Mouth Once Daily 6)  Potassium Chloride Crys Cr 20 Meq Cr-Tabs (Potassium Chloride Crys Cr) .... 2 Tabs Morning and 1 Tablet in The Afternoon 7)  Coq10 100 Mg  Caps (Coenzyme Q10) .Marland Kitchen.. 1 Once Daily 8)  Multivitamins   Tabs (Multiple Vitamin) .... Once Daily 9)  Furosemide 40 Mg Tabs (Furosemide) .... Three Tabs By Mouth Two Times A Day 10)  Calcium 600-D 600-400 Mg-Unit Tabs (Calcium Carbonate-Vitamin D) .... 2 By Mouth Daily 11)  Actonel 35 Mg Tabs (Risedronate Sodium) .Marland Kitchen.. 1 Tablet By Mouth Every Week 12)  Bipap .... At Bedtime 13)  Oxygen 4 Liters .... 24/7 14)  Alprazolam 0.25 Mg Tabs (Alprazolam) .... One By Mouth Three Times A Day As Needed 15)  Nasonex 50 Mcg/act  Susp (Mometasone Furoate) .... Two Puffs Each Nostril Daily As Needed 16)  Imdur 60 Mg Xr24h-Tab (Isosorbide Mononitrate) .... Once Daily 17)  Proair Hfa  108 (90 Base) Mcg/act Aers (Albuterol Sulfate) .... Two Puffs Up To Four Times Per Day As Needed 18)  Nitrostat 0.4 Mg Subl (Nitroglycerin) .Marland Kitchen.. 1 Tablet Under Tongue At Onset of Chest Pain; You May Repeat Every 5 Minutes For Up To 3 Doses. 19)  Aspirin 81 Mg Tbec (Aspirin) .... Take One Tablet By Mouth Daily  Allergies (verified): 1)  ! Penicillin 2)  ! * Zetia 3)  ! Levaquin 4)  ! 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 Coralyn Helling      - 03/02 P-ANCA > 1:640, Myeloperoxidase Ab 17.6, ANA negative, RPR negative, RF < 20      - 07/09 ANCA < 1:20      - PFT 07/09 FEV1 1.90 (81%), FVC 2.66 (71%), TLC 5.08 (87%), DLCO 42%      - MTX started 06/18/09 Community acquired pneumonia Chronic hypoxemic respiratory failure      - 4 liters oxygen  Chronic rhinitis Prostate cancer s/p external radiation and hormonal therapy      - Dr. Jethro Bolus Renal cell carcinoma Left eye tumor s/p enucleation age 84 Chronic systolic congestive heart failure                   -  Dr. Olga Millers                    - Echo 07/09 EF 50% Coronary artery disease Ventricular Tachycardia Second degree AV block, Mobitz Type II Mild Aortic regurgitation Carotid bruit Hypertension Hyperlipidemia Urticaria Right leg cellulitis Anxiety Osteoporosis      - T12, L1, L2 compression fracture Obstructive sleep apnea      - PSG 01/08 AHI 11      - BPAP 9/5 cm H2O Right elbow bursitis Gout Thyroid cyst GERD  Past Surgical History: Last updated: 12/18/2008 CABG 02/01      - Dr. Sheliah Plane Left partial nephrectomy 04/02      - Dr. Jethro Bolus TURP Septoplasty Right tympanoplasty Left tympanic implant Pacemaker insertion 12/07 Kyphoplasty 04/09     - Dr. Colon Branch  Vital Signs:  Patient profile:   77 year old male Height:      65 inches (165.10  cm) Weight:      178.50 pounds (81.14 kg) O2 Sat:      93 % on 4 L/min Temp:     97.5 degrees F (36.39 degrees C) oral Pulse rate:   63 / minute BP sitting:   112 / 76  (right arm) Cuff size:   regular  Vitals Entered By: Michel Bickers CMA (October 16, 2009 2:07 PM)  O2 Sat at Rest %:  93 O2 Flow:  4 L/min  Physical Exam  General:  on supplemental oxygen.   Nose:  clear nasal discharge.   Mouth:  no deformity or lesions Neck:  no JVD.   Lungs:  basilar rales less prominent, no wheezing or rhonchi Heart:  regular rate and rhythm, S1, S2 without murmurs, rubs, gallops, or clicks Abdomen:  obese, soft, mild distention, normal bowel sounds Extremities:  no edema Cervical Nodes:  no significant adenopathy   Impression & Recommendations:  Problem # 1:  POSTINFLAMMATORY PULMONARY FIBROSIS (ICD-515) Will continue to taper his prednisone.  He is to continue on MTX.  Again explained that he will always have some trouble with his breathing and oxygen.  What we are trying to accomplish is to keep him out of the hospital, and keep him as functional as possible.  Problem # 2:  HYPOXEMIA (ICD-799.02)  He is to continue on supplemental oxygen.  Orders: Est. Patient Level III (16109)  Problem # 3:  OBSTRUCTIVE SLEEP APNEA (ICD-327.23)  He is to continue on BPAP.  Orders: Est. Patient Level III (60454)  Medications Added to Medication List This Visit: 1)  Prednisone 5 Mg Tabs (Prednisone) .... 1/2 pill every other day for 2 weeks, and then stop prednisone 2)  Calcium 600-d 600-400 Mg-unit Tabs (Calcium carbonate-vitamin d) .... 2 by mouth daily  Complete Medication List: 1)  Prednisone 5 Mg Tabs (Prednisone) .... 1/2 pill every other day for 2 weeks, and then stop prednisone 2)  Methotrexate 2.5 Mg Tabs (Methotrexate sodium) .... 4 pills once per week 3)  Folic Acid 1 Mg Tabs (Folic acid) .... One by mouth once daily 4)  Carvedilol 12.5 Mg Tabs (Carvedilol) .Marland Kitchen.. 1 tab by mouth two  times a day 5)  Lipitor 80 Mg Tabs (Atorvastatin calcium) .Marland Kitchen.. 1 by mouth once daily 6)  Potassium Chloride Crys Cr 20 Meq Cr-tabs (Potassium chloride crys cr) .... 2 tabs morning and 1 tablet in the afternoon 7)  Coq10 100 Mg Caps (Coenzyme q10) .Marland Kitchen.. 1 once daily 8)  Multivitamins Tabs (Multiple vitamin) .Marland KitchenMarland KitchenMarland Kitchen  Once daily 9)  Furosemide 40 Mg Tabs (Furosemide) .... Three tabs by mouth two times a day 10)  Calcium 600-d 600-400 Mg-unit Tabs (Calcium carbonate-vitamin d) .... 2 by mouth daily 11)  Actonel 35 Mg Tabs (Risedronate sodium) .Marland Kitchen.. 1 tablet by mouth every week 12)  Bipap  .... At bedtime 13)  Oxygen 4 Liters  .... 24/7 14)  Alprazolam 0.25 Mg Tabs (Alprazolam) .... One by mouth three times a day as needed 15)  Nasonex 50 Mcg/act Susp (Mometasone furoate) .... Two puffs each nostril daily as needed 16)  Imdur 60 Mg Xr24h-tab (Isosorbide mononitrate) .... Once daily 17)  Proair Hfa 108 (90 Base) Mcg/act Aers (Albuterol sulfate) .... Two puffs up to four times per day as needed 18)  Nitrostat 0.4 Mg Subl (Nitroglycerin) .Marland Kitchen.. 1 tablet under tongue at onset of chest pain; you may repeat every 5 minutes for up to 3 doses. 19)  Aspirin 81 Mg Tbec (Aspirin) .... Take one tablet by mouth daily  Patient Instructions: 1)  Prednisone 5 mg pill: 1/2 pill once daily for 2 weeks, then if okay stop prednisone 2)  Follow up in 6 to 8 weeks

## 2010-10-08 NOTE — Assessment & Plan Note (Signed)
Summary: ROV 6 WKS ///KP   Copy to:  Olga Millers Primary Deacon Gadbois/Referring Dellene Mcgroarty:  Gershon Crane  CC:  Pt here for 6 week follow-up. Pt states he has good days and bad. .  History of Present Illness: 77  year old male with  known history of  pauci-immune vasculitis with pulmonary fibrosis, hypoxemia, and sleep apnea on nocturnal  BIPAP 8/5.  He has been feeling okay.  He still gets winded easily, but no worse than usual.  He is not having much cough or sputum.  He denies chest pain, fever, or hemoptysis.  He has not been having leg swelling.  He has been sleeping okay, but still gets tired during the day.  He tries using his BPAP, but continues to have trouble with air coming out his ear.   Current Medications (verified): 1)  Methotrexate 2.5 Mg Tabs (Methotrexate Sodium) .... 3 Pills Per Week 2)  Folic Acid 1 Mg Tabs (Folic Acid) .... One By Mouth Once Daily 3)  Carvedilol 12.5 Mg  Tabs (Carvedilol) .Marland Kitchen.. 1 Tab By Mouth Two Times A Day 4)  Lipitor 80 Mg Tabs (Atorvastatin Calcium) .Marland Kitchen.. 1 By Mouth Once Daily 5)  Potassium Chloride Crys Cr 20 Meq Cr-Tabs (Potassium Chloride Crys Cr) .... 2 Tabs Morning and 1 Tablet in The Afternoon 6)  Coq10 100 Mg  Caps (Coenzyme Q10) .Marland Kitchen.. 1 Once Daily 7)  Multivitamins   Tabs (Multiple Vitamin) .... Once Daily 8)  Furosemide 40 Mg Tabs (Furosemide) .... Three Tabs By Mouth Two Times A Day 9)  Calcium 600-D 600-400 Mg-Unit Tabs (Calcium Carbonate-Vitamin D) .... 2 By Mouth Daily 10)  Bipap .... At Bedtime 11)  Oxygen 4 L At Rest and  5 L Exertion 12)  Alprazolam 0.25 Mg Tabs (Alprazolam) .... One By Mouth Three Times A Day As Needed 13)  Nasonex 50 Mcg/act  Susp (Mometasone Furoate) .... Two Puffs Each Nostril Daily As Needed 14)  Proair Hfa 108 (90 Base) Mcg/act Aers (Albuterol Sulfate) .... Two Puffs Up To Four Times Per Day As Needed 15)  Nitrostat 0.4 Mg Subl (Nitroglycerin) .Marland Kitchen.. 1 Tablet Under Tongue At Onset of Chest Pain; You May Repeat Every  5 Minutes For Up To 3 Doses. 16)  Aspirin 81 Mg Tbec (Aspirin) .... Take One Tablet By Mouth Daily 17)  Imdur 60 Mg Xr24h-Tab (Isosorbide Mononitrate) .... Take One Tablet By Mouth Daily  Allergies (verified): 1)  ! Penicillin 2)  ! * Zetia 3)  ! Levaquin 4)  ! Cipro  Vital Signs:  Patient profile:   77 year old male Height:      62 inches Weight:      155.50 pounds O2 Sat:      97 % on 4 L/min Temp:     97.3 degrees F oral Pulse rate:   82 / minute BP sitting:   108 / 64  (right arm) Cuff size:   regular  Vitals Entered By: Carron Curie CMA (June 27, 2010 1:34 PM)  O2 Flow:  4 L/min CC: Pt here for 6 week follow-up. Pt states he has good days and bad.  Comments Medications reviewed with patient Carron Curie CMA  June 27, 2010 1:36 PM Daytime phone number verified with patient.    Physical Exam  General:  on supplemental oxygen.   Nose:  clear nasal discharge.   Mouth:  no deformity or lesions Neck:  no JVD.   Lungs:  basilar crackles, no wheeze, no dullness Heart:  regular  rate and rhythm, S1, S2 without murmurs, rubs, gallops, or clicks Extremities:  no edema Neurologic:  normal CN II-XII and strength normal.   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  Explained to him that the goal of therapy at this point is to maintain his current level of function, and monitor for any clinical deterioration.  Problem # 2:  HYPOXEMIA (ICD-799.02)  Continue supplemental oxygen.  Orders: Est. Patient Level III (57846)  Problem # 3:  OBSTRUCTIVE SLEEP APNEA (ICD-327.23)  He is willing to continue with BPAP.  If he has more difficulty will need to arrange for overnight oximeter on oxygen w/o BPAP to see if he needs any adjustments in his nocturnal O2 setting.  Orders: Est. Patient Level III (96295)  Complete  Medication List: 1)  Methotrexate 2.5 Mg Tabs (Methotrexate sodium) .... 3 pills per week 2)  Folic Acid 1 Mg Tabs (Folic acid) .... One by mouth once daily 3)  Carvedilol 12.5 Mg Tabs (Carvedilol) .Marland Kitchen.. 1 tab by mouth two times a day 4)  Lipitor 80 Mg Tabs (Atorvastatin calcium) .Marland Kitchen.. 1 by mouth once daily 5)  Potassium Chloride Crys Cr 20 Meq Cr-tabs (Potassium chloride crys cr) .... 2 tabs morning and 1 tablet in the afternoon 6)  Coq10 100 Mg Caps (Coenzyme q10) .Marland Kitchen.. 1 once daily 7)  Multivitamins Tabs (Multiple vitamin) .... Once daily 8)  Furosemide 40 Mg Tabs (Furosemide) .... Three tabs by mouth two times a day 9)  Calcium 600-d 600-400 Mg-unit Tabs (Calcium carbonate-vitamin d) .... 2 by mouth daily 10)  Bipap  .... At bedtime 11)  Oxygen 4 L At Rest and 5 L Exertion  12)  Alprazolam 0.25 Mg Tabs (Alprazolam) .... One by mouth three times a day as needed 13)  Nasonex 50 Mcg/act Susp (Mometasone furoate) .... Two puffs each nostril daily as needed 14)  Proair Hfa 108 (90 Base) Mcg/act Aers (Albuterol sulfate) .... Two puffs up to four times per day as needed 15)  Nitrostat 0.4 Mg Subl (Nitroglycerin) .Marland Kitchen.. 1 tablet under tongue at onset of chest pain; you may repeat every 5 minutes for up to 3 doses. 16)  Aspirin 81 Mg Tbec (Aspirin) .... Take one tablet by mouth daily 17)  Imdur 60 Mg Xr24h-tab (Isosorbide mononitrate) .... Take one tablet by mouth daily  Patient Instructions: 1)  Continue methotrexate 2)  Follow up in 3 months 3)  Flu shot today    Appended Document: flu shot Flu Vaccine Consent Questions     Do you have a history of severe allergic reactions to this vaccine? no    Any prior history of allergic reactions to egg and/or gelatin? no    Do you have a sensitivity to the preservative Thimersol? no    Do you have a past history of Guillan-Barre Syndrome? no    Do you currently have an acute febrile illness? no    Have you ever had a severe reaction to latex? no     Vaccine information given and explained to patient? yes    Are you currently pregnant? no    Lot Number:[id number]   Exp Date:03/08/2011   Site Given  Left Deltoid IM Carron Curie CMA  June 27, 2010 2:01 PM     Clinical Lists Changes  Orders: Added new Service order of Flu Vaccine 40yrs + MEDICARE PATIENTS [phone]) -  Signed Added new Service order of Administration Flu vaccine - MCR [phone]) - Signed Observations: Added new observation of FLU VAX VIS: 04/02/2010 version (06/27/2010 14:01) Added new observation of FLU VAXLOT: [id number] (06/27/2010 14:01) Added new observation of FLU VAXMFR: [employer] (06/27/2010 14:01) Added new observation of FLU VAX EXP: 03/08/2011 (06/27/2010 14:01) Added new observation of FLU VAX DSE: 0.47ml (06/27/2010 14:01) Added new observation of FLU VAX: Fluvax 3+ (06/27/2010 14:01)

## 2010-10-08 NOTE — Assessment & Plan Note (Signed)
Summary: Cardiology Nuclear Study  Nuclear Med Background Indications for Stress Test: Evaluation for Ischemia, Post Hospital  Indications Comments: 01/03/10 [facility] ((-)  enzymes)  History: CABG, COPD, Echo, Heart Catheterization, Myocardial Infarction, Myocardial Perfusion Study, Pacemaker  History Comments: ' NSTEMI EF 40%--Heart Cath--CABG x4 '07 Pacemaker ICM/2nd Degree AVB '08 MPS EF 40% Inf. scar with trivial P-I ischemia '09 ECHO EF 50%  Symptoms: Chest Pain, DOE, Fatigue, Fatigue with Exertion, Palpitations, SOB    Nuclear Pre-Procedure Cardiac Risk Factors: Carotid Disease, CVA, Family History - CAD, Hypertension, Lipids Caffeine/Decaff Intake: None NPO After: 6:00 PM Lungs: clear IV 0.9% NS with Angio Cath: 20g     IV Site: (R) AC IV Started by: Stanton Kidney EMT-P Chest Size (in) 40     Height (in): 62 Weight (lb): 164 BMI: 30.10 Tech Comments: Coreg held > 13 hours, per Patient. O2 Sat= 97-98% on 4 LPM nasal can. Note: Pt was given Alprazolam 0.25mg  orally at 9:40am due to claustrophobia, per Dr. Jens Som. W.Deal, RT-N  Nuclear Med Study 1 or 2 day study:  1 day     Stress Test Type:  Eugenie Birks Reading MD:  Arvilla Meres, MD     Referring MD:  B.Crenshaw Resting Radionuclide:  Technetium 27m Tetrofosmin     Resting Radionuclide Dose:  10.0 mCi  Stress Radionuclide:  Technetium 19m Tetrofosmin     Stress Radionuclide Dose:  33.0 mCi   Stress Protocol   Lexiscan: 0.4 mg   Stress Test Technologist:  Milana Na EMT-P     Nuclear Technologist:  Domenic Polite CNMT  Rest Procedure  Myocardial perfusion imaging was performed at rest 45 minutes following the intravenous administration of Myoview Technetium 13m Tetrofosmin.  Stress Procedure  The patient received IV Lexiscan 0.4 mg over 15-seconds.  Myoview injected at 30-seconds.  There were no significant changes, rare pvcs, sob, and RUG pain with infusion.  Quantitative spect images were obtained  after a 45 minute delay.  QPS Raw Data Images:  Normal; no motion artifact; normal heart/lung ratio. Stress Images:  There is normal uptake in all areas. Rest Images:  Mildly decreased uptake in the inferior wall and apex. Subtraction (SDS):  Mildly decreased uptake in the inferior wall and apex. Transient Ischemic Dilatation:  .97  (Normal <1.22)  Lung/Heart Ratio:  .43  (Normal <0.45)  Quantitative Gated Spect Images QGS EDV:  96 ml QGS ESV:  42 ml QGS EF:  56 % QGS cine images:  Mild septal HK.  Findings Low risk nuclear study  Evidence for inferior infarct     Overall Impression  Exercise Capacity: Lexiscan study with no exercise. ECG Impression: Baseline: NSR; No significant ST segment change with Lexiscan. Overall Impression: Low risk stress nuclear study. Overall Impression Comments: Very mild inferior and apical infarct. No ischemia  Appended Document: Cardiology Nuclear Study ok  Appended Document: Cardiology Nuclear Study pt aware of results

## 2010-10-08 NOTE — Cardiovascular Report (Signed)
Summary: Office Visit Remote   Office Visit Remote   Imported By: Roderic Ovens 01/09/2010 10:08:16  _____________________________________________________________________  External Attachment:    Type:   Image     Comment:   External Document

## 2010-10-08 NOTE — Letter (Signed)
Summary: Remote Device Check  [facility], Main Office  [address]   [place], [place] [address]   Phone: [phone]  Fax: [phone]     January 04, 2010 MRN: [id number]   Seville Clements [address] [place], [place]  [address]   Dear Mr. Pearlman,   Your remote transmission was recieved and reviewed by your physician.  All diagnostics were within normal limits for you.   ___X___Your next office visit is scheduled for:    JULY 2011 WITH DR Graciela Husbands. Please call our office to schedule an appointment.    Sincerely,  Proofreader

## 2010-10-08 NOTE — Progress Notes (Signed)
Summary: returning call   Phone Note Call from Patient Call back at Home Phone [phone]   Caller: Patient Reason for Call: Talk to Nurse Summary of Call: returning call  Initial call taken by: Migdalia Dk,  October 19, 2009 10:49 AM  Follow-up for Phone Call        PT AWARE OF LAB RESULTS AND MED CHANGE Follow-up by: Scherrie Bateman, LPN,  October 19, 2009 11:55 AM

## 2010-10-08 NOTE — Letter (Signed)
Summary: Remote Device Check  [facility], Main Office  [address]   [place], [place] [address]   Phone: [phone]  Fax: [phone]     July 12, 2010 MRN: [id number]   [name] [address] [place], [place]  [address]   Dear Mr. Christenberry,   Your remote transmission was recieved and reviewed by your physician.  All diagnostics were within normal limits for you.  ___X__Your next transmission is scheduled for:09/19/2010.   Please transmit at any time this day.  If you have a wireless device your transmission will be sent automatically.  ______Your next office visit is scheduled for:                              . Please call our office to schedule an appointment.    Sincerely,  Altha Harm, LPN

## 2010-10-08 NOTE — Progress Notes (Signed)
Summary: med question  Medications Added MUCINEX DM 30-600 MG  TB12 (DEXTROMETHORPHAN-GUAIFENESIN) 1 by mouth two times a day MUCINEX DM 30-600 MG  TB12 (DEXTROMETHORPHAN-GUAIFENESIN) 1 by mouth two times a day ASPIR-LOW 81 MG TBEC (ASPIRIN) 1 once daily ASPIR-LOW 81 MG TBEC (ASPIRIN) 1 once daily FOLIC ACID 800 MCG  TABS (FOLIC ACID) 1 once daily FOLIC ACID 800 MCG  TABS (FOLIC ACID) 1 once daily COZAAR 50 MG TABS (LOSARTAN POTASSIUM) 1 once daily COZAAR 25 MG  TABS (LOSARTAN POTASSIUM) 1 by mouth once daily COZAAR 25 MG  TABS (LOSARTAN POTASSIUM) 1 by mouth once daily PREDNISONE 20 MG TABS (PREDNISONE) QD PREDNISONE 10 MG  TABS (PREDNISONE) one by mouth qd PREDNISONE 10 MG  TABS (PREDNISONE) one by mouth qd NASONEX 50 MCG/ACT SUSP (MOMETASONE FUROATE) 1-2 each nostril qd NASONEX 50 MCG/ACT SUSP (MOMETASONE FUROATE) 1-2 each nostril qd PREDNISONE 5 MG TABS (PREDNISONE) 1 by mouth every other day and as directed PREDNISONE 10 MG  TABS (PREDNISONE) use as directed PREDNISONE 5 MG TABS (PREDNISONE) 1/2 pill every other day for 2 weeks, and then stop prednisone * PREDNISONE 5 MG  TABS (PREDNISONE) one by mouth once daily PREDNISONE 5 MG TABS (PREDNISONE) 1 by mouth every other day PREDNISONE 5 MG TABS (PREDNISONE) 1 by mouth daily PREDNISONE 5 MG TABS (PREDNISONE) tapered dose as directed PREDNISONE 5 MG TABS (PREDNISONE) 2 once daily PREDNISONE 5 MG TABS (PREDNISONE) 1 by mouth every other day PREDNISONE 5 MG  TABS (PREDNISONE) one by mouth once daily * PREDNISONE 5 MG  TABS (PREDNISONE) one by mouth every other day PREDNISONE 5 MG TABS (PREDNISONE) 1/2 pill every other day for 2 weeks, and then stop prednisone COZAAR 25 MG  TABS (LOSARTAN POTASSIUM) One tablet each day. LASIX 40 MG TABS (FUROSEMIDE) 1 by mouth once daily COZAAR 25 MG  TABS (LOSARTAN POTASSIUM) One tablet each day. PREDNISONE 5 MG  TABS (PREDNISONE) once daily PREDNISONE 5 MG  TABS (PREDNISONE) once daily CARVEDILOL  12.5 MG  TABS (CARVEDILOL) 1/2 tablet two times a day by mouth CARVEDILOL 12.5 MG  TABS (CARVEDILOL) 1 tablet two times a day by mouth METHOTREXATE 2.5 MG TABS (METHOTREXATE SODIUM) 3 pills once per week CARVEDILOL 12.5 MG  TABS (CARVEDILOL) 1/2  tablet two times a day by mouth CARVEDILOL 12.5 MG  TABS (CARVEDILOL) 1 am and 1 pm by mouth daily METHOTREXATE 2.5 MG TABS (METHOTREXATE SODIUM) 4 pills once per week FOLIC ACID 1 MG TABS (FOLIC ACID) one by mouth once daily FUROSEMIDE 80 MG  TABS (FUROSEMIDE) two times a day FUROSEMIDE 80 MG  TABS (FUROSEMIDE) two times a day PROTONIX 40 MG  TBEC (PANTOPRAZOLE SODIUM) 1 by mouth once daily OMEPRAZOLE 40 MG  CPDR (OMEPRAZOLE) one by mouth once daily OMEPRAZOLE 40 MG  CPDR (OMEPRAZOLE) one by mouth once daily CARVEDILOL 12.5 MG  TABS (CARVEDILOL) 1 tab by mouth two times a day POTASSIUM CHLORIDE 20 MEQ  PACK (POTASSIUM CHLORIDE) 2 TABS TWICE DAILY CARVEDILOL 12.5 MG  TABS (CARVEDILOL) 1 by mouth daily POTASSIUM CHLORIDE 20 MEQ  PACK (POTASSIUM CHLORIDE) 2 by mouth in the morning, 1 in the afternoon POTASSIUM CHLORIDE 20 MEQ  PACK (POTASSIUM CHLORIDE) 2 by mouth once daily CARVEDILOL 25 MG  TABS (CARVEDILOL) 1 by mouth once daily COQ10 100 MG  CAPS (COENZYME Q10) 2 once daily LIPITOR 80 MG TABS (ATORVASTATIN CALCIUM) 1 by mouth once daily PREDNISONE 5 MG  TABS (PREDNISONE) one by mouth once daily PREDNISONE 5 MG  TABS (PREDNISONE) one by mouth once daily PREDNISONE 10 MG  TABS (PREDNISONE) take 4 tabs (total of 40mg ) by mouth once daily PREDNISONE 10 MG  TABS (PREDNISONE) take 4 tabs (total of 40mg ) by mouth once daily PRILOSEC OTC 20 MG  TBEC (OMEPRAZOLE MAGNESIUM) once daily POTASSIUM CHLORIDE CRYS CR 20 MEQ CR-TABS (POTASSIUM CHLORIDE CRYS CR) 2 tabs morning and 1 tablet in the afternoon AVELOX 400 MG  TABS (MOXIFLOXACIN HCL) 1 by mouth once daily AVELOX 400 MG  TABS (MOXIFLOXACIN HCL) 1 by mouth once daily [employer]S VITAMIN E 800 UNIT  CAPS (VITAMIN  E) once daily CVS VITAMIN E 800 UNIT  CAPS (VITAMIN E) once daily COQ10 100 MG  CAPS (COENZYME Q10) 1 once daily PREDNISONE 10 MG  TABS (PREDNISONE) use as directed PREDNISONE 10 MG  TABS (PREDNISONE) use as directed OCUVITE   TABS (MULTIPLE VITAMINS-MINERALS) Take 1 tab by mouth daily OCUVITE   TABS (MULTIPLE VITAMINS-MINERALS) Take 1 tab by mouth daily PREDNISONE 20 MG  TABS (PREDNISONE) use as directed PREDNISONE 20 MG  TABS (PREDNISONE) use as directed VITAMIN D 1000 UNIT  TABS (CHOLECALCIFEROL) 1 by mouth once daily BL VITAMIN C 1000 MG  TABS (ASCORBIC ACID) once daily BL VITAMIN C 1000 MG  TABS (ASCORBIC ACID) once daily PREDNISONE 5 MG  TABS (PREDNISONE) use as directed PREDNISONE 5 MG  TABS (PREDNISONE) use as directed PRILOSEC OTC 20 MG  TBEC (OMEPRAZOLE MAGNESIUM) as needed PRILOSEC OTC 20 MG  TBEC (OMEPRAZOLE MAGNESIUM) as needed ACTONEL 35 MG  TABS (RISEDRONATE SODIUM) every week ACTONEL 35 MG  TABS (RISEDRONATE SODIUM) every week POTASSIUM CHLORIDE 40 MEQ/100ML  SOLN (POTASSIUM CHLORIDE) 2 tabs in AM and 2 tabs pm POTASSIUM CHLORIDE 40 MEQ/100ML  SOLN (POTASSIUM CHLORIDE) 2 tabs in AM and 2 tabs pm PREDNISONE 2.5 MG  TABS (PREDNISONE) one by mouth once daily PREDNISONE 2.5 MG  TABS (PREDNISONE) one by mouth once daily ALPRAZOLAM 0.25 MG TBDP (ALPRAZOLAM) one by mouth two times a day as needed ALPRAZOLAM 0.25 MG TBDP (ALPRAZOLAM) one by mouth two times a day as needed LEVAQUIN 500 MG TABS (LEVOFLOXACIN) once daily PREDNISONE 10 MG TABS (PREDNISONE) take as directed PREDNISONE 10 MG TABS (PREDNISONE) take as directed FUROSEMIDE 40 MG TABS (FUROSEMIDE) 4 tabs by mouth two times a day FUROSEMIDE 40 MG TABS (FUROSEMIDE) 4 tabs two times a day MULTIVITAMINS   TABS (MULTIPLE VITAMIN) once daily FUROSEMIDE 40 MG TABS (FUROSEMIDE) 3 tabs by mouth two times a day * VITAMIN D 800MG  1 by mouth once daily * VITAMIN D 800MG  1 by mouth once daily CALCIUM CARBONATE-VITAMIN D 600-400  MG-UNIT  TABS (CALCIUM CARBONATE-VITAMIN D) Take 1 tablet by mouth two times a day OMNICEF 300 MG CAPS (CEFDINIR) 2 by mouth once daily OMNICEF 300 MG CAPS (CEFDINIR) 2 by mouth once daily FUROSEMIDE 40 MG TABS (FUROSEMIDE) three tabs by mouth two times a day CALCIUM 1200-1000 MG-UNIT CHEW (CALCIUM CARBONATE-VIT D-MIN) 1 tab by mouth once daily CALCIUM 600-D 600-400 MG-UNIT TABS (CALCIUM CARBONATE-VITAMIN D) 2 by mouth daily ACTONEL 35 MG TABS (RISEDRONATE SODIUM) 1 tablet by mouth every week ACTONEL 35 MG TABS (RISEDRONATE SODIUM) 1 tablet by mouth every week OSTEO BI-FLEX ADV DOUBLE ST  TABS (MISC NATURAL PRODUCTS) once two times a day by mouth OSTEO BI-FLEX ADV DOUBLE ST  TABS (MISC NATURAL PRODUCTS) once daily OSTEO BI-FLEX ADV DOUBLE ST  TABS (MISC NATURAL PRODUCTS) once two times a day by mouth LEVAQUIN 750 MG TABS (LEVOFLOXACIN) one by mouth  once daily LEVAQUIN 750 MG TABS (LEVOFLOXACIN) one by mouth once daily ZITHROMAX Z-PAK 250 MG TABS (AZITHROMYCIN) as directed * BIPAP at bedtime CLARITIN 10 MG CAPS (LORATADINE) one by mouth once daily as needed CLARITIN 10 MG CAPS (LORATADINE) one by mouth once daily as needed PROAIR HFA 108 (90 BASE) MCG/ACT AERS (ALBUTEROL SULFATE) two puff up to fours times per day as needed PROAIR HFA 108 (90 BASE) MCG/ACT AERS (ALBUTEROL SULFATE) two puff up to fours times per day as needed AVELOX 400 MG TABS (MOXIFLOXACIN HCL) 1 by mouth once daily AVELOX 400 MG TABS (MOXIFLOXACIN HCL) 1 by mouth once daily AVELOX 400 MG TABS (MOXIFLOXACIN HCL) 1 by mouth once daily AVELOX 400 MG TABS (MOXIFLOXACIN HCL) 1 by mouth once daily PREDNISONE 10 MG TABS (PREDNISONE) 2 tabs once daily for 5 d, 1 tabs once daily for 5 d, then 1/2 every day, then 1/2 every other day PREDNISONE 10 MG TABS (PREDNISONE) 2 tabs once daily for 5 d, 1 tabs once daily for 5 d, then 1/2 every day, then 1/2 every other day * OXYGEN 4 LITERS 24/7 * OXYGEN 4 L AT REST AND  5 L EXERTION    METHOTREXATE 2.5 MG TABS (METHOTREXATE SODIUM) 3 pills once per week ALPRAZOLAM 0.25 MG TABS (ALPRAZOLAM) one by mouth three times a day as needed NASONEX 50 MCG/ACT  SUSP (MOMETASONE FUROATE) Two puffs each nostril daily as needed SYMBICORT 160-4.5 MCG/ACT AERO (BUDESONIDE-FORMOTEROL FUMARATE) 2 puffs two times a day SYMBICORT 160-4.5 MCG/ACT AERO (BUDESONIDE-FORMOTEROL FUMARATE) 2 puffs two times a day IMDUR 60 MG XR24H-TAB (ISOSORBIDE MONONITRATE) once daily IMDUR 60 MG XR24H-TAB (ISOSORBIDE MONONITRATE) once daily PROAIR HFA 108 (90 BASE) MCG/ACT AERS (ALBUTEROL SULFATE) two puffs up to four times per day as needed NITROSTAT 0.4 MG SUBL (NITROGLYCERIN) 1 tablet under tongue at onset of chest pain; you may repeat every 5 minutes for up to 3 doses. ASPIRIN 81 MG TBEC (ASPIRIN) Take one tablet by mouth daily IMDUR 60 MG XR24H-TAB (ISOSORBIDE MONONITRATE) Take one tablet by mouth daily       Phone Note Call from Patient Call back at Home Phone [phone]   Caller: Patient Reason for Call: Talk to Nurse Summary of Call: request to speak to nurse about meds Initial call taken by: Migdalia Dk,  February 15, 2010 8:28 AM  Follow-up for Phone Call        spoke with pt. Pt. states he has been taken Imdur 60 mg since he was d/c from [facility] hospital on 01/04/10. According to the d/c note from [facility] hospital on 01/04/10 patient was taken Imdur 60 mg one tablet  daily.  I prescription for Imdur and carvedilol 12.5 mg one tablet twice a day send to pharmacy. Pt aware Follow-up by: Ollen Gross, RN, BSN,  February 15, 2010 9:03 AM    New/Updated Medications: IMDUR 60 MG XR24H-TAB (ISOSORBIDE MONONITRATE) Take one tablet by mouth daily Prescriptions: CARVEDILOL 12.5 MG  TABS (CARVEDILOL) 1 tab by mouth two times a day  #60 x 8   Entered by:   Ollen Gross, RN, BSN   Authorized by:   Ferman Hamming, MD, [facility]   Signed by:   Ollen Gross, RN, BSN on 02/15/2010   Method used:    Electronically to        [facility] [address] [id number]* (retail)       [address]       [place]  [place], [place]  [address]       Ph: [phone]       Fax: [phone]   RxID:   [id number] IMDUR 60 MG XR24H-TAB (ISOSORBIDE MONONITRATE) Take one tablet by mouth daily  #30 x 8   Entered by:   Ollen Gross, RN, BSN   Authorized by:   Ferman Hamming, MD, [facility]   Signed by:   Ollen Gross, RN, BSN on 02/15/2010   Method used:   Electronically to        [id number]* (retail)       [address] [place]       [place], [place]  [address]       Ph: [phone]       Fax: [phone]   RxID:   [id number]

## 2010-10-08 NOTE — Progress Notes (Signed)
Summary: cxr results - pt is aware  Phone Note Call from Patient Call back at Home Phone [phone]   Caller: Patient Call For: sood Reason for Call: Lab or Test Results Summary of Call: Wants cxr results. Initial call taken by: Darletta Moll,  April 26, 2010 3:43 PM  Follow-up for Phone Call        called spoke with patient.  advised of cxr results/recs as stated by TP.  pt verbalized his understanding.  Follow-up by: Boone Master CNA/MA,  April 26, 2010 4:06 PM

## 2010-10-08 NOTE — Miscellaneous (Signed)
  Clinical Lists Changes  Observations: Added new observation of NUCLEAR NOS: Exercise Capacity: Lexiscan study with no exercise. ECG Impression: Baseline: NSR; No significant ST segment change with Lexiscan. Overall Impression: Low risk stress nuclear study. Overall Impression Comments: Very mild inferior and apical infarct. No ischemia    (01/24/2010 15:12)      Nuclear Study  Procedure date:  01/24/2010  Findings:      Exercise Capacity: Lexiscan study with no exercise. ECG Impression: Baseline: NSR; No significant ST segment change with Lexiscan. Overall Impression: Low risk stress nuclear study. Overall Impression Comments: Very mild inferior and apical infarct. No ischemia

## 2010-10-08 NOTE — Letter (Signed)
Summary: Custom - Lipid  [facility], Main Office  [address]   [place], [place] [address]   Phone: [phone]  Fax: [phone]     Jan 21, 2010 MRN: [id number]   [name] [address] [place], [place]  [address]   Dear [name],  We have reviewed your cholesterol results.  They are as follows:     Total Cholesterol:    184 (Desirable: less than 200)       HDL  Cholesterol:     43.70  (Desirable: greater than 40 for men and 50 for women)       LDL Cholesterol:       119  (Desirable: less than 100 for low risk and less than 70 for moderate to high risk)       Triglycerides:       106.0  (Desirable: less than 150)  Our recommendations include:These numbers look good. Continue on the same medicine. Sodium, potassium, kidney and Liver function are normal. Take care, Dr. Darel Hong.    Call our office at the number listed above if you have any questions.  Lowering your LDL cholesterol is important, but it is only one of a large number of "risk factors" that may indicate that you are at risk for heart disease, stroke or other complications of hardening of the arteries.  Other risk factors include:   A.  Cigarette Smoking* B.  High Blood Pressure* C.  Obesity* D.   Low HDL Cholesterol (see yours above)* E.   Diabetes Mellitus (higher risk if your is uncontrolled) F.  Family history of premature heart disease G.  Previous history of stroke or cardiovascular disease    *These are risk factors YOU HAVE CONTROL OVER.  For more information, visit .  There is now evidence that lowering the TOTAL CHOLESTEROL AND LDL CHOLESTEROL can reduce the risk of heart disease.  The American Heart Association recommends the following guidelines for the treatment of elevated cholesterol:  1.  If there is now current heart disease and less than two risk factors, TOTAL CHOLESTEROL should be less than 200 and LDL CHOLESTEROL should be less than 100. 2.  If there is  current heart disease or two or more risk factors, TOTAL CHOLESTEROL should be less than 200 and LDL CHOLESTEROL should be less than 70.  A diet low in cholesterol, saturated fat, and calories is the cornerstone of treatment for elevated cholesterol.  Cessation of smoking and exercise are also important in the management of elevated cholesterol and preventing vascular disease.  Studies have shown that 30 to 60 minutes of physical activity most days can help lower blood pressure, lower cholesterol, and keep your weight at a healthy level.  Drug therapy is used when cholesterol levels do not respond to therapeutic lifestyle changes (smoking cessation, diet, and exercise) and remains unacceptably high.  If medication is started, it is important to have you levels checked periodically to evaluate the need for further treatment options.  Thank you,    [facility] Team

## 2010-10-08 NOTE — Letter (Signed)
Summary: Remote Device Check  [facility], Main Office  [address]   [place], [place] [address]   Phone: [phone]  Fax: [phone]     October 10, 2009 MRN: [id number]   [name] [address] [place], [place]  [address]   Dear [name],   Your remote transmission was recieved and reviewed by your physician.  All diagnostics were within normal limits for you.  __X___Your next transmission is scheduled for:     December 26, 2009.  Please transmit at any time this day.  If you have a wireless device your transmission will be sent automatically.      Sincerely,  Proofreader

## 2010-10-08 NOTE — Letter (Signed)
Summary: Statement of Medical Necessity/ [facility]  Statement of Medical Necessity/ [facility]   Imported By: Lennie Odor 03/25/2010 16:06:00  _____________________________________________________________________  External Attachment:    Type:   Image     Comment:   External Document

## 2010-10-08 NOTE — Cardiovascular Report (Signed)
Summary: Office Visit Remote   Office Visit Remote   Imported By: Roderic Ovens 07/16/2010 15:29:44  _____________________________________________________________________  External Attachment:    Type:   Image     Comment:   External Document

## 2010-10-08 NOTE — Assessment & Plan Note (Signed)
Summary: 6-8 weeks/acp   Copy to:  Olga Millers Primary Provider/Referring Provider:  Gershon Crane  CC:  Pulmonary fibrosis. The patient c/o increased sob and cough with milky white mucus..  History of Present Illness: 77  year old [name] with  known history of  pauci-immune vasculitis with pulmonary fibrosis, hypoxemia, and sleep apnea on nocturnal  BIPAP 9/5.  He was seen in the ED on March 11 for dypsnea.  He had CXR and CT chest which did not show any significant change in his pulm. fibrosis.  There were no additional lung findings.  He feels that his breathing gets worse when the weather is bad.  He has some cough with clear sputum.  He is off prednisone now.  He feels like his vision is getting worse.  He gets winded easily with activity.  He feels that he is getting too much pressure from his BPAP.   CXR  Procedure date:  11/16/2009  Findings:      PORTABLE CHEST - 1 VIEW    Comparison: 08/16/2009 chest CT.    Findings: Cardiomegaly.  Median sternotomy.  CABG.  Dual lead left   subclavian cardiac pacemaker.  Atelectasis and scarring at the lung   bases bilaterally.  Basilar fibrotic changes noted on prior chest   CT are again identified.  No superimposed airspace disease.  No   edema.  Patient rotated slightly to the left.    IMPRESSION:    1.  Cardiomegaly, CABG without evidence of failure.  Unchanged left   subclavian cardiac pacemaker.   2.  Basilar pulmonary fibrosis.   CT of Chest  Procedure date:  11/16/2009  Findings:       Contrast:  64 ml Omnipaque-300    Comparison:  Chest radiograph 11/15/2009 and chest CT 08/16/2009.   Chest CT 2008    Findings:  This is a technically very good examination for   evaluating pulmonary arteries. No filling defects are identified in   the pulmonary arteries.    The thoracic aorta contains scattered atherosclerotic   calcification, and is normal in caliber. There is a faint   calcification of the aortic valve.   Heavy atherosclerotic   calcification of the coronary arteries, in this patient status post   CABG.  Dual lead pacer is present to the left subclavian approach,   with leads terminating in the right atrium and right ventricle.   Mild cardiomegaly.    Negative for pleural or pericardial effusion or lymphadenopathy.    There is a low density lesion in the right lobe of the thyroid   gland that appears similar compared to prior CT of 2008, likely   benign. T    here is pleuroparenchymal thickening at the right lung apex,   stable.  There iaresubpleural cystic changes with honeycombing in   the lower lobes bilaterally.  Findings are similar compared to   study the chest CT of 2008.  There is mild bronchiectasis in the   lower lobes bilaterally.  No airspace disease is identified.    Marked compression deformity of this sclerotic T9 vertebral body   appears similar compared to chest CT of 08/16/2009. Slight   retropulsion of the superior endplate is stable.    There is a new compression fracture of the T11 vertebral body. The   T11 vertebral body is sclerotic in appearance, as is the T9   vertebral body.  There is approximately 50% loss of body height.   This compression deformity has  occurred since the most recent CT of   08/16/2009.There is approximately 2-3 mm retropulsion of the   superior endplate of T11.    Vertebroplasty changes at T12 are partially visualized.    Review of the MIP images confirms the above findings.  Comments:      IMPRESSION:    1.  Interval compression deformity of T11 vertebral body, with   minimal retropulsion of the superior endplate.  The T11 vertebral   body is sclerotic in appearance, as is the patient's T9 vertebral   body.  Metastatic disease is not excluded.   2.  Negative for pulmonary embolism.   3.  Mild cardiomegaly without edema.   4.  Stable appearance of basilar predominant pulmonary fibrosis.   5.  Stable T9 vertebral body compression  fracture.    Current Medications (verified): 1)  Prednisone 5 Mg Tabs (Prednisone) .... 1/2 Pill Every Other Day For 2 Weeks, and Then Stop Prednisone 2)  Methotrexate 2.5 Mg Tabs (Methotrexate Sodium) .... 4 Pills Once Per Week 3)  Folic Acid 1 Mg Tabs (Folic Acid) .... One By Mouth Once Daily 4)  Carvedilol 12.5 Mg  Tabs (Carvedilol) .Marland Kitchen.. 1 Tab By Mouth Two Times A Day 5)  Lipitor 80 Mg Tabs (Atorvastatin Calcium) .Marland Kitchen.. 1 By Mouth Once Daily 6)  Potassium Chloride Crys Cr 20 Meq Cr-Tabs (Potassium Chloride Crys Cr) .... 2 Tabs Morning and 1 Tablet in The Afternoon 7)  Coq10 100 Mg  Caps (Coenzyme Q10) .Marland Kitchen.. 1 Once Daily 8)  Multivitamins   Tabs (Multiple Vitamin) .... Once Daily 9)  Furosemide 40 Mg Tabs (Furosemide) .... Three Tabs By Mouth Two Times A Day 10)  Calcium 600-D 600-400 Mg-Unit Tabs (Calcium Carbonate-Vitamin D) .... 2 By Mouth Daily 11)  Actonel 35 Mg Tabs (Risedronate Sodium) .Marland Kitchen.. 1 Tablet By Mouth Every Week 12)  Bipap .... At Bedtime 13)  Oxygen 4 Liters .... 24/7 14)  Alprazolam 0.25 Mg Tabs (Alprazolam) .... One By Mouth Three Times A Day As Needed 15)  Nasonex 50 Mcg/act  Susp (Mometasone Furoate) .... Two Puffs Each Nostril Daily As Needed 16)  Proair Hfa 108 (90 Base) Mcg/act Aers (Albuterol Sulfate) .... Two Puffs Up To Four Times Per Day As Needed 17)  Nitrostat 0.4 Mg Subl (Nitroglycerin) .Marland Kitchen.. 1 Tablet Under Tongue At Onset of Chest Pain; You May Repeat Every 5 Minutes For Up To 3 Doses. 18)  Aspirin 81 Mg Tbec (Aspirin) .... Take One Tablet By Mouth Daily  Allergies (verified): 1)  ! Penicillin 2)  ! * Zetia 3)  ! Levaquin 4)  ! Cipro  Past History:  Past Medical History: Reviewed history from 10/17/2009 and no changes required. 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 Coralyn Helling      - 03/02 P-ANCA > 1:640, Myeloperoxidase Ab 17.6, ANA negative, RPR negative, RF < 20      - 07/09 ANCA < 1:20      -  PFT 07/09 FEV1 1.90 (81%), FVC 2.66 (71%), TLC 5.08 (87%), DLCO 42%      - MTX started 06/18/09 Community acquired pneumonia Chronic hypoxemic respiratory failure      - 4 liters oxygen  Chronic rhinitis Prostate cancer s/p external radiation and hormonal therapy      - Dr. Jethro Bolus Renal cell carcinoma Left eye tumor s/p enucleation age 63 Chronic systolic congestive heart failure                   -  Dr. Olga Millers                    - Echo 07/09 EF 50% Coronary artery disease Ventricular Tachycardia Second degree AV block, Mobitz Type II Mild Aortic regurgitation Hypertension Hyperlipidemia Anxiety Osteoporosis      - T12, L1, L2 compression fracture Obstructive sleep apnea      - PSG 01/08 AHI 11      - BPAP 9/5 cm H2O Right elbow bursitis Gout Thyroid cyst GERD  Past Surgical History: Reviewed history from 12/18/2008 and no changes required. CABG 02/01      - Dr. Sheliah Plane Left partial nephrectomy 04/02      - Dr. Jethro Bolus TURP Septoplasty Right tympanoplasty Left tympanic implant Pacemaker insertion 12/07 Kyphoplasty 04/09     - Dr. Colon Branch  Vital Signs:  Patient profile:   77 year old [name] Height:      65 inches (165.10 cm) Weight:      172 pounds (78.18 kg) O2 Sat:      93 % on 4 L/min Temp:     97.2 degrees F (36.22 degrees C) oral Pulse rate:   75 / minute BP sitting:   116 / 70  (right arm) Cuff size:   regular  Vitals Entered By: Michel Bickers CMA (November 30, 2009 1:43 PM)  O2 Sat at Rest %:  93 O2 Flow:  4 L/min  Physical Exam  General:  on supplemental oxygen.   Nose:  clear nasal discharge.   Mouth:  no deformity or lesions Neck:  no JVD.   Lungs:  basilar rales, no wheezing or rhonchi Heart:  regular rate and rhythm, S1, S2 without murmurs, rubs, gallops, or clicks Extremities:  no edema Cervical Nodes:  no significant adenopathy   Impression & Recommendations:  Problem # 1:  POSTINFLAMMATORY  PULMONARY FIBROSIS (ICD-515) He has chronic dypsnea related to his pulmonary fibrosis.  His recent CT chest did not show any significant progression of his disease inspite of being off prednisone.  Will continue methotrexate for now.  Again explained that much of his disease process is irreversible, and that he will always have some degree of dyspnea.  Problem # 2:  HYPOXEMIA (ICD-799.02) He is to continue on supplemental oxygen.  Problem # 3:  OBSTRUCTIVE SLEEP APNEA (ICD-327.23) Will decrease the pressure on his BPAP to 8/5 and see if he tolerates this better.  Medications Added to Medication List This Visit: 1)  Oxygen 4 L At Rest and 5 L Exertion   Complete Medication List: 1)  Methotrexate 2.5 Mg Tabs (Methotrexate sodium) .... 4 pills once per week 2)  Folic Acid 1 Mg Tabs (Folic acid) .... One by mouth once daily 3)  Carvedilol 12.5 Mg Tabs (Carvedilol) .Marland Kitchen.. 1 tab by mouth two times a day 4)  Lipitor 80 Mg Tabs (Atorvastatin calcium) .Marland Kitchen.. 1 by mouth once daily 5)  Potassium Chloride Crys Cr 20 Meq Cr-tabs (Potassium chloride crys cr) .... 2 tabs morning and 1 tablet in the afternoon 6)  Coq10 100 Mg Caps (Coenzyme q10) .Marland Kitchen.. 1 once daily 7)  Multivitamins Tabs (Multiple vitamin) .... Once daily 8)  Furosemide 40 Mg Tabs (Furosemide) .... Three tabs by mouth two times a day 9)  Calcium 600-d 600-400 Mg-unit Tabs (Calcium carbonate-vitamin d) .... 2 by mouth daily 10)  Actonel 35 Mg Tabs (Risedronate sodium) .Marland Kitchen.. 1 tablet by mouth every week 11)  Bipap  .... At bedtime 12)  Oxygen  4 L At Rest and 5 L Exertion  13)  Alprazolam 0.25 Mg Tabs (Alprazolam) .... One by mouth three times a day as needed 14)  Nasonex 50 Mcg/act Susp (Mometasone furoate) .... Two puffs each nostril daily as needed 15)  Proair Hfa 108 (90 Base) Mcg/act Aers (Albuterol sulfate) .... Two puffs up to four times per day as needed 16)  Nitrostat 0.4 Mg Subl (Nitroglycerin) .Marland Kitchen.. 1 tablet under tongue at onset of  chest pain; you may repeat every 5 minutes for up to 3 doses. 17)  Aspirin 81 Mg Tbec (Aspirin) .... Take one tablet by mouth daily  Other Orders: Est. Patient Level III (14782) DME Referral (DME) DME Referral (DME)  Patient Instructions: 1)  Will decrease pressure on BPAP machine 2)  Continue methotrexate 3)  Follow up in 2 months Prescriptions: FOLIC ACID 1 MG TABS (FOLIC ACID) one by mouth once daily  #30 x 6   Entered and Authorized by:   Coralyn Helling MD   Signed by:   Coralyn Helling MD on 11/30/2009   Method used:   Electronically to        [facility] [id number]* (retail)       [address]       [place], [place]  [address]       Ph: [phone]       Fax: [phone]   RxID:   [id number] METHOTREXATE 2.5 MG TABS (METHOTREXATE SODIUM) 4 pills once per week  #16.0 Each x 4   Entered and Authorized by:   Coralyn Helling MD   Signed by:   Coralyn Helling MD on 11/30/2009   Method used:   Electronically to        [facility] #[id number]* (retail)       [address]       [place], [place]  [address]       Ph: [phone]       Fax: [phone]   RxID:   [id number]

## 2010-10-08 NOTE — Progress Notes (Signed)
Summary: Nuclear Pre-Precedure  Phone Note Outgoing Call   Call placed by: Milana Na, EMT-P,  Jan 23, 2010 3:03 PM Summary of Call: Reviewed information on [employer] Information Sheet (see scanned document for further details).  Spoke with patient.     Nuclear Med Background Indications for Stress Test: Evaluation for Ischemia, Post Hospital  Indications Comments: 01/03/10 [facility] CP ((-)  enzymes)  History: CABG, COPD, Echo, Heart Catheterization, Myocardial Infarction, Myocardial Perfusion Study, Pacemaker  History Comments: ' NSTEMI EF 40%--Heart Cath--CABG x4 '07 Pacemaker ICM/2nd Degree AVB '08 MPS EF 40% Inf. scar with trivial P-I ischemia '09 ECHO EF 50%  Symptoms: Chest Pain, DOE, SOB    Nuclear Pre-Procedure Cardiac Risk Factors: Carotid Disease, CVA, Family History - CAD, Hypertension, Lipids Height (in): 65  Nuclear Med Study Referring MD:  B.Crenshaw

## 2010-10-08 NOTE — Assessment & Plan Note (Signed)
Summary: 4-6 week return/mhh   Copy to:  Olga Millers Primary Provider/Referring Provider:  Gershon Crane  CC:  sob with exertion(pt med instruction 5l on exertion) pt says most time keep at 4l and cough with milky white production.  History of Present Illness: 77  year old male with  known history of  pauci-immune vasculitis with pulmonary fibrosis, hypoxemia, and sleep apnea on nocturnal  BIPAP 8/5.  He still has a cough with white sputum.  It is a little bit easier to bring up sputum.  He denies fever, sweats, or hemoptysis.  He had a band like feeling around his stomach last week.  He used nitroglycerin, and this helped.  He also uses xanax at times and this helps his chest discomfort also.  He uses his nebulizer, and feels like this helps bring up sputum.  He using 3 pills of methotrexate a week.  He has not been able to tolerate using BPAP.  He feels like the pressure is blowing out of his ear.  This was still a problem even after the pressure on his machine was decreased.  Preventive Screening-Counseling & Management  Alcohol-Tobacco     Smoking Status: never  Current Medications (verified): 1)  Methotrexate 2.5 Mg Tabs (Methotrexate Sodium) .... 3 Pills Per Week 2)  Folic Acid 1 Mg Tabs (Folic Acid) .... One By Mouth Once Daily 3)  Carvedilol 12.5 Mg  Tabs (Carvedilol) .Marland Kitchen.. 1 Tab By Mouth Two Times A Day 4)  Lipitor 80 Mg Tabs (Atorvastatin Calcium) .Marland Kitchen.. 1 By Mouth Once Daily 5)  Potassium Chloride Crys Cr 20 Meq Cr-Tabs (Potassium Chloride Crys Cr) .... 2 Tabs Morning and 1 Tablet in The Afternoon 6)  Coq10 100 Mg  Caps (Coenzyme Q10) .Marland Kitchen.. 1 Once Daily 7)  Multivitamins   Tabs (Multiple Vitamin) .... Once Daily 8)  Furosemide 40 Mg Tabs (Furosemide) .... Three Tabs By Mouth Two Times A Day 9)  Calcium 600-D 600-400 Mg-Unit Tabs (Calcium Carbonate-Vitamin D) .... 2 By Mouth Daily 10)  Bipap .... At Bedtime 11)  Oxygen 4 L At Rest and  5 L Exertion 12)  Alprazolam 0.25 Mg Tabs  (Alprazolam) .... One By Mouth Three Times A Day As Needed 13)  Nasonex 50 Mcg/act  Susp (Mometasone Furoate) .... Two Puffs Each Nostril Daily As Needed 14)  Proair Hfa 108 (90 Base) Mcg/act Aers (Albuterol Sulfate) .... Two Puffs Up To Four Times Per Day As Needed 15)  Nitrostat 0.4 Mg Subl (Nitroglycerin) .Marland Kitchen.. 1 Tablet Under Tongue At Onset of Chest Pain; You May Repeat Every 5 Minutes For Up To 3 Doses. 16)  Aspirin 81 Mg Tbec (Aspirin) .... Take One Tablet By Mouth Daily 17)  Imdur 60 Mg Xr24h-Tab (Isosorbide Mononitrate) .... Take One Tablet By Mouth Daily  Allergies (verified): 1)  ! Penicillin 2)  ! * Zetia 3)  ! Levaquin 4)  ! Cipro  Past History:  Past Medical History: Reviewed history from 01/29/2010 and no changes required. Pauci-immune vasculitis with pulmonary fibrosis      - Dr. Coralyn Helling      - 03/02 P-ANCA > 1:640, Myeloperoxidase Ab 17.6, ANA negative, RPR negative, RF < 20      - 07/09 ANCA < 1:20      - PFT 07/09 FEV1 1.90 (81%), FVC 2.66 (71%), TLC 5.08 (87%), DLCO 42%      - MTX started 06/18/09 Community acquired pneumonia Chronic hypoxemic respiratory failure      - 4 liters oxygen  Chronic rhinitis Prostate cancer s/p external radiation and hormonal therapy      - Dr. Jethro Bolus Renal cell carcinoma Left eye tumor s/p enucleation age 69 Chronic systolic congestive heart failure                   - Dr. Olga Millers                    - Echo 07/09 EF 50% Coronary artery disease Ventricular Tachycardia Second degree AV block, Mobitz Type II Mild Aortic regurgitation Hypertension Hyperlipidemia Anxiety Osteoporosis      - T12, L1, L2 compression fracture Obstructive sleep apnea      - PSG 01/08 AHI 11      - BPAP 9/5 cm H2O Right elbow bursitis Gout Thyroid cyst GERD  Past Surgical History: Reviewed history from 12/18/2008 and no changes required. CABG 02/01      - Dr. Sheliah Plane Left partial nephrectomy 04/02      - Dr.  Jethro Bolus TURP Septoplasty Right tympanoplasty Left tympanic implant Pacemaker insertion 12/07 Kyphoplasty 04/09     - Dr. Colon Branch  Social History: Smoking Status:  never  Vital Signs:  Patient profile:   77 year old male Height:      62 inches Weight:      151.13 pounds BMI:     27.74 O2 Sat:      98 % on Room air Temp:     97.4 degrees F oral Pulse rate:   79 / minute BP sitting:   120 / 60  (right arm) Cuff size:   regular  Vitals Entered By: Kandice Hams CMA (May 06, 2010 11:59 AM)  O2 Flow:  Room air CC: sob with exertion(pt med instruction 5l on exertion) pt says most time keep at 4l, cough with milky white production Comments meds and pharmacy verified with patient .Kandice Hams CMA  May 06, 2010 12:02 PM    Physical Exam  General:  on supplemental oxygen.   Nose:  clear nasal discharge.   Mouth:  no deformity or lesions Neck:  no JVD.   Lungs:  basilar crackles, no wheeze, no dullness Heart:  regular rate and rhythm, S1, S2 without murmurs, rubs, gallops, or clicks Extremities:  no edema Neurologic:  normal CN II-XII and strength normal.   Cervical Nodes:  no significant adenopathy   Impression & Recommendations:  Problem # 1:  POSTINFLAMMATORY PULMONARY FIBROSIS (ICD-515) He has improved with increase methotrexate.  Will repeat his labs today.  Problem # 2:  HYPOXEMIA (ICD-799.02) He is to continue on supplemental oxygen.  Problem # 3:  OBSTRUCTIVE SLEEP APNEA (ICD-327.23) He has not been able to tolerate BPAP.  Will give him a break from this, and monitor his sleep pattern.  Complete Medication List: 1)  Methotrexate 2.5 Mg Tabs (Methotrexate sodium) .... 3 pills per week 2)  Folic Acid 1 Mg Tabs (Folic acid) .... One by mouth once daily 3)  Carvedilol 12.5 Mg Tabs (Carvedilol) .Marland Kitchen.. 1 tab by mouth two times a day 4)  Lipitor 80 Mg Tabs (Atorvastatin calcium) .Marland Kitchen.. 1 by mouth once daily 5)  Potassium Chloride Crys Cr 20 Meq  Cr-tabs (Potassium chloride crys cr) .... 2 tabs morning and 1 tablet in the afternoon 6)  Coq10 100 Mg Caps (Coenzyme q10) .Marland Kitchen.. 1 once daily 7)  Multivitamins Tabs (Multiple vitamin) .... Once daily 8)  Furosemide 40 Mg Tabs (Furosemide) .... Three tabs by mouth two  times a day 9)  Calcium 600-d 600-400 Mg-unit Tabs (Calcium carbonate-vitamin d) .... 2 by mouth daily 10)  Bipap  .... At bedtime 11)  Oxygen 4 L At Rest and 5 L Exertion  12)  Alprazolam 0.25 Mg Tabs (Alprazolam) .... One by mouth three times a day as needed 13)  Nasonex 50 Mcg/act Susp (Mometasone furoate) .... Two puffs each nostril daily as needed 14)  Proair Hfa 108 (90 Base) Mcg/act Aers (Albuterol sulfate) .... Two puffs up to four times per day as needed 15)  Nitrostat 0.4 Mg Subl (Nitroglycerin) .Marland Kitchen.. 1 tablet under tongue at onset of chest pain; you may repeat every 5 minutes for up to 3 doses. 16)  Aspirin 81 Mg Tbec (Aspirin) .... Take one tablet by mouth daily 17)  Imdur 60 Mg Xr24h-tab (Isosorbide mononitrate) .... Take one tablet by mouth daily  Other Orders: Est. Patient Level III (72536) TLB-Hepatic/Liver Function Pnl (80076-HEPATIC) TLB-CBC Platelet - w/Differential (85025-CBCD) TLB-BMP (Basic Metabolic Panel-BMET) (80048-METABOL) TLB-BNP (B-Natriuretic Peptide) (83880-BNPR) TLB-Sedimentation Rate (ESR) (85652-ESR)  Patient Instructions: 1)  Lab work today 2)  Follow up in 6 to 8 weeks

## 2010-10-08 NOTE — Cardiovascular Report (Signed)
Summary: Office Visit Remote   Office Visit Remote   Imported By: Roderic Ovens 10/10/2009 12:13:41  _____________________________________________________________________  External Attachment:    Type:   Image     Comment:   External Document

## 2010-10-08 NOTE — Assessment & Plan Note (Signed)
Summary: [name] ///kp   Copy to:  Olga Millers Primary Provider/Referring Provider:  Gershon Crane  CC:  4 week follow up, pt states bipap machine volume was lowered and patients states he still can't use it, pt states when using it, it blows air out the ears, Pt states breathing is not getting any better, SOB exertion and at rest, productive cough with cloudy color, and pt states he has freaquent chest tightness.  History of Present Illness: 77  year old male with  known history of  pauci-immune vasculitis with pulmonary fibrosis, hypoxemia, and sleep apnea on nocturnal  BIPAP 8/5.  He has been getting more cough and congestion.  He brings up clear to yellow sputum.  He denies fever or hemoptysis.  He feels like he gets winded more easily.  He has not been able to tolerate auto BPAP due to the sensation that air is blowing out his left ear.  Preventive Screening-Counseling & Management  Alcohol-Tobacco     Smoking Status: quit     Packs/Day: <1.0     Year Started: age 89     Year Quit: 6 months later  Current Medications (verified): 1)  Methotrexate 2.5 Mg Tabs (Methotrexate Sodium) .... 2 Pills Per Week For Two Weeks, Then 1 Pill Per Week 2)  Folic Acid 1 Mg Tabs (Folic Acid) .... One By Mouth Once Daily 3)  Carvedilol 12.5 Mg  Tabs (Carvedilol) .Marland Kitchen.. 1 Tab By Mouth Two Times A Day 4)  Lipitor 80 Mg Tabs (Atorvastatin Calcium) .Marland Kitchen.. 1 By Mouth Once Daily 5)  Potassium Chloride Crys Cr 20 Meq Cr-Tabs (Potassium Chloride Crys Cr) .... 2 Tabs Morning and 1 Tablet in The Afternoon 6)  Coq10 100 Mg  Caps (Coenzyme Q10) .Marland Kitchen.. 1 Once Daily 7)  Multivitamins   Tabs (Multiple Vitamin) .... Once Daily 8)  Furosemide 40 Mg Tabs (Furosemide) .... Three Tabs By Mouth Two Times A Day 9)  Calcium 600-D 600-400 Mg-Unit Tabs (Calcium Carbonate-Vitamin D) .... 2 By Mouth Daily 10)  Bipap .... At Bedtime 11)  Oxygen 4 L At Rest and  5 L Exertion 12)  Alprazolam 0.25 Mg Tabs (Alprazolam) .... One By  Mouth Three Times A Day As Needed 13)  Nasonex 50 Mcg/act  Susp (Mometasone Furoate) .... Two Puffs Each Nostril Daily As Needed 14)  Proair Hfa 108 (90 Base) Mcg/act Aers (Albuterol Sulfate) .... Two Puffs Up To Four Times Per Day As Needed 15)  Nitrostat 0.4 Mg Subl (Nitroglycerin) .Marland Kitchen.. 1 Tablet Under Tongue At Onset of Chest Pain; You May Repeat Every 5 Minutes For Up To 3 Doses. 16)  Aspirin 81 Mg Tbec (Aspirin) .... Take One Tablet By Mouth Daily 17)  Imdur 60 Mg Xr24h-Tab (Isosorbide Mononitrate) .... Take One Tablet By Mouth Daily  Allergies (verified): 1)  ! Penicillin 2)  ! * Zetia 3)  ! Levaquin 4)  ! Cipro  Past History:  Past Medical History: Reviewed history from 01/29/2010 and no changes required. Pauci-immune vasculitis with pulmonary fibrosis      - Dr. Coralyn Helling      - 03/02 P-ANCA > 1:640, Myeloperoxidase Ab 17.6, ANA negative, RPR negative, RF < 20      - 07/09 ANCA < 1:20      - PFT 07/09 FEV1 1.90 (81%), FVC 2.66 (71%), TLC 5.08 (87%), DLCO 42%      - MTX started 06/18/09 Community acquired pneumonia Chronic hypoxemic respiratory failure      - 4  liters oxygen  Chronic rhinitis Prostate cancer s/p external radiation and hormonal therapy      - Dr. Jethro Bolus Renal cell carcinoma Left eye tumor s/p enucleation age 76 Chronic systolic congestive heart failure                   - Dr. Olga Millers                    - Echo 07/09 EF 50% Coronary artery disease Ventricular Tachycardia Second degree AV block, Mobitz Type II Mild Aortic regurgitation Hypertension Hyperlipidemia Anxiety Osteoporosis      - T12, L1, L2 compression fracture Obstructive sleep apnea      - PSG 01/08 AHI 11      - BPAP 9/5 cm H2O Right elbow bursitis Gout Thyroid cyst GERD  Past Surgical History: Reviewed history from 12/18/2008 and no changes required. CABG 02/01      - Dr. Sheliah Plane Left partial nephrectomy 04/02      - Dr. Jethro Bolus TURP Septoplasty Right tympanoplasty Left tympanic implant Pacemaker insertion 12/07 Kyphoplasty 04/09     - Dr. Colon Branch  Social History: Smoking Status:  quit Packs/Day:  <1.0  Vital Signs:  Patient profile:   78 year old male Height:      62 inches Weight:      158.8 pounds O2 Sat:      94 % on 4 L/min Temp:     96.9 degrees F oral Pulse rate:   79 / minute BP sitting:   100 / 60  (right arm) Cuff size:   regular  Vitals Entered By: Carver Fila (March 28, 2010 12:11 PM)  O2 Flow:  4 L/min CC: 4 week follow up, pt states bipap machine volume was lowered and patients states he still can't use it, pt states when using it, it blows air out the ears, Pt states breathing is not getting any better, SOB exertion and at rest, productive cough with cloudy color, pt states he has freaquent chest tightness   Physical Exam  General:  on supplemental oxygen.   Nose:  clear nasal discharge.   Mouth:  no deformity or lesions Neck:  no JVD.   Lungs:  increase rales b/l, no wheeze Heart:  regular rate and rhythm, S1, S2 without murmurs, rubs, gallops, or clicks Extremities:  no edema Cervical Nodes:  no significant adenopathy   Impression & Recommendations:  Problem # 1:  POSTINFLAMMATORY PULMONARY FIBROSIS (ICD-515)  He has increased symptoms and more prominent findings on clinical exam since his MTX was decreased to one pill per week.  Will repeat his chest xray today and decide what interventions are needed.  Problem # 2:  HYPOXEMIA (ICD-799.02)  He is to continue on supplemental oxygen.  Problem # 3:  OBSTRUCTIVE SLEEP APNEA (ICD-327.23) Will empirically set his BPAP at 7/5 to see if he can tolerate this.  Complete Medication List: 1)  Methotrexate 2.5 Mg Tabs (Methotrexate sodium) .... 2 pills per week for two weeks, then 1 pill per week 2)  Folic Acid 1 Mg Tabs (Folic acid) .... One by mouth once daily 3)  Carvedilol 12.5 Mg Tabs (Carvedilol) .Marland Kitchen.. 1 tab  by mouth two times a day 4)  Lipitor 80 Mg Tabs (Atorvastatin calcium) .Marland Kitchen.. 1 by mouth once daily 5)  Potassium Chloride Crys Cr 20 Meq Cr-tabs (Potassium chloride crys cr) .... 2 tabs morning and 1 tablet in the afternoon 6)  Coq10  100 Mg Caps (Coenzyme q10) .Marland Kitchen.. 1 once daily 7)  Multivitamins Tabs (Multiple vitamin) .... Once daily 8)  Furosemide 40 Mg Tabs (Furosemide) .... Three tabs by mouth two times a day 9)  Calcium 600-d 600-400 Mg-unit Tabs (Calcium carbonate-vitamin d) .... 2 by mouth daily 10)  Bipap  .... At bedtime 11)  Oxygen 4 L At Rest and 5 L Exertion  12)  Alprazolam 0.25 Mg Tabs (Alprazolam) .... One by mouth three times a day as needed 13)  Nasonex 50 Mcg/act Susp (Mometasone furoate) .... Two puffs each nostril daily as needed 14)  Proair Hfa 108 (90 Base) Mcg/act Aers (Albuterol sulfate) .... Two puffs up to four times per day as needed 15)  Nitrostat 0.4 Mg Subl (Nitroglycerin) .Marland Kitchen.. 1 tablet under tongue at onset of chest pain; you may repeat every 5 minutes for up to 3 doses. 16)  Aspirin 81 Mg Tbec (Aspirin) .... Take one tablet by mouth daily 17)  Imdur 60 Mg Xr24h-tab (Isosorbide mononitrate) .... Take one tablet by mouth daily  Other Orders: Est. Patient Level III (99213) T-2 View CXR (71020TC) DME Referral (DME)  Patient Instructions: 1)  chest xray today 2)  follow up in 4 to 6 weeks

## 2010-10-08 NOTE — Assessment & Plan Note (Signed)
Summary: 1 YR F/U [employer]      Allergies Added:   Referring Provider:  Olga Millers Primary Provider:  Gershon Crane   History of Present Illness: [name] is seen in followup for ischemic heart disease with prior CABG, bradycardia and intermittent complete heart block status post pacemaker implantation. He also is on chronic oxygen because of pulmonary fibrosis and vasculitis. His breathing is relatively stable.  He is status post coronary bypass and graft, ischemic cardiomyopathy, combined systolic/diastolic heart failure   He saw Dr. Jens Som in May 2011 with complaints of chest pain.  A. Myoview was obtained at that visit demonstrating a low risk scan with inferior perfusion defects it did not change an ejection fraction of 45%.  He continues to describe episodes which he calls "panic attacks". These are associated with significant shortness of breath and chest discomfort. He has used both alprazolam as well as nitroglycerin both affordin g some relief. His nitroglycerin bottle has been open for many many months. There is no associated headache.  Current Medications (verified): 1)  Methotrexate 2.5 Mg Tabs (Methotrexate Sodium) .... 2 Pills Per Week For Two Weeks, Then 1 Pill Per Week 2)  Folic Acid 1 Mg Tabs (Folic Acid) .... One By Mouth Once Daily 3)  Carvedilol 12.5 Mg  Tabs (Carvedilol) .Marland Kitchen.. 1 Tab By Mouth Two Times A Day 4)  Lipitor 80 Mg Tabs (Atorvastatin Calcium) .Marland Kitchen.. 1 By Mouth Once Daily 5)  Potassium Chloride Crys Cr 20 Meq Cr-Tabs (Potassium Chloride Crys Cr) .... 2 Tabs Morning and 1 Tablet in The Afternoon 6)  Coq10 100 Mg  Caps (Coenzyme Q10) .Marland Kitchen.. 1 Once Daily 7)  Multivitamins   Tabs (Multiple Vitamin) .... Once Daily 8)  Furosemide 40 Mg Tabs (Furosemide) .... Three Tabs By Mouth Two Times A Day 9)  Calcium 600-D 600-400 Mg-Unit Tabs (Calcium Carbonate-Vitamin D) .... 2 By Mouth Daily 10)  Bipap .... At Bedtime 11)  Oxygen 4 L At Rest and  5 L Exertion 12)   Alprazolam 0.25 Mg Tabs (Alprazolam) .... One By Mouth Three Times A Day As Needed 13)  Nasonex 50 Mcg/act  Susp (Mometasone Furoate) .... Two Puffs Each Nostril Daily As Needed 14)  Proair Hfa 108 (90 Base) Mcg/act Aers (Albuterol Sulfate) .... Two Puffs Up To Four Times Per Day As Needed 15)  Nitrostat 0.4 Mg Subl (Nitroglycerin) .Marland Kitchen.. 1 Tablet Under Tongue At Onset of Chest Pain; You May Repeat Every 5 Minutes For Up To 3 Doses. 16)  Aspirin 81 Mg Tbec (Aspirin) .... Take One Tablet By Mouth Daily 17)  Imdur 60 Mg Xr24h-Tab (Isosorbide Mononitrate) .... Take One Tablet By Mouth Daily  Allergies (verified): 1)  ! Penicillin 2)  ! * Zetia 3)  ! Levaquin 4)  ! Cipro  Past History:  Past Medical History: Last updated: 01/29/2010 Pauci-immune vasculitis with pulmonary fibrosis      - Dr. Coralyn Helling      - 03/02 P-ANCA > 1:640, Myeloperoxidase Ab 17.6, ANA negative, RPR negative, RF < 20      - 07/09 ANCA < 1:20      - PFT 07/09 FEV1 1.90 (81%), FVC 2.66 (71%), TLC 5.08 (87%), DLCO 42%      - MTX started 06/18/09 Community acquired pneumonia Chronic hypoxemic respiratory failure      - 4 liters oxygen  Chronic rhinitis Prostate cancer s/p external radiation and hormonal therapy      - Dr. Jethro Bolus Renal cell carcinoma Left eye  tumor s/p enucleation age 77 Chronic systolic congestive heart failure                   - Dr. Olga Millers                    - Echo 07/09 EF 50% Coronary artery disease Ventricular Tachycardia Second degree AV block, Mobitz Type II Mild Aortic regurgitation Hypertension Hyperlipidemia Anxiety Osteoporosis      - T12, L1, L2 compression fracture Obstructive sleep apnea      - PSG 01/08 AHI 11      - BPAP 9/5 cm H2O Right elbow bursitis Gout Thyroid cyst GERD  Past Surgical History: Last updated: 12/18/2008 CABG 02/01      - Dr. Sheliah Plane Left partial nephrectomy 04/02      - Dr. Jethro Bolus TURP Septoplasty Right  tympanoplasty Left tympanic implant Pacemaker insertion 12/07 Kyphoplasty 04/09     - Dr. Colon Branch  Family History: Last updated: 12/18/2008 Father - CAD, TB Mother - Colon cancer  Social History: Last updated: 12/18/2008 Widowed.  Three children.  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  Occasional alcohol use.   Vital Signs:  Patient profile:   77 year old male Height:      62 inches Weight:      157 pounds BMI:     28.82 Pulse rate:   75 / minute Pulse rhythm:   regular BP sitting:   118 / 62  (right arm) Cuff size:   regular  Vitals Entered By: Judithe Modest CMA (March 15, 2010 10:23 AM)  Physical Exam  General:  on supplemental oxygen.  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   Lungs:  basilar rales, no wheezing or rhonchi Heart:  regular rate and rhythm, S1, S2 without murmurs, rubs, gallops, or clicks Abdomen:  obese, soft, mild distention, normal bowel sounds Extremities:  no clubbing or cyanosis there is 1-2+ edema on the right which is his venotomy leg and trace on the left Neurologic:  alert and oriented grossly normal Skin:  normal   PPM Specifications Following MD:  Sherryl Manges, MD     Referring MD:  Cecille Po Vendor:  [employer]     PPM Model Number:  [id number]     PPM Serial Number:  [id number] PPM DOI:  08/11/2006     PPM Implanting MD:  Sherryl Manges, MD  Lead 1    Location: [facility]     DOI: 08/11/2006     Model #: [id number]     Serial #: [id number]     Status: active Lead 2    Location: [facility]     DOI: 08/11/2006     Model #: [id number]     Serial #: [id number]     Status: active   Indications:  MOBITZ II   PPM Follow Up Remote Check?  No Battery Voltage:  3.00 V     Pacer Dependent:  No       PPM Device Measurements Atrium  Amplitude: 1.1 mV, Impedance: 352 ohms, Threshold: 1.0 V at 0.4 msec Right Ventricle  Amplitude: 7.4 mV, Impedance: 664 ohms, Threshold: 1.0 V at 0.4 msec  Episodes MS  Episodes:  0     Coumadin:  No Ventricular High Rate:  1     Atrial Pacing:  84.8%     Ventricular Pacing:  0.3%  Parameters Mode:  MVP (R)     Lower Rate Limit:  75     Upper Rate Limit:  130 Paced AV Delay:  180     Sensed AV Delay:  150 Next Remote Date:  06/20/2010     Next Cardiology Appt Due:  03/10/2011 Tech Comments:  Normal device function.  Rate response turned on today.  No other changes made.  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  ROV 12 months SK. Gypsy Balsam RN BSN  March 15, 2010 11:17 AM   Impression & Recommendations:  Problem # 1:  CHEST PAIN (ICD-786.50) he continues to have spells of chest pain associated with shortness of breath and "panic attacks". I wonder if this may not represent, notwithstanding his low risk Myoview, small vessel ischemic heart disease. I have suggested that he renew his nitroglycerin. He will throw away his expired bottle and he will plan to refill it every 3-4 months following opening.  He is to followup with Dr. Jens Som in 2 months as scheduled. His updated medication list for this problem includes:    Carvedilol 12.5 Mg Tabs (Carvedilol) .Marland Kitchen... 1 tab by mouth two times a day    Nitrostat 0.4 Mg Subl (Nitroglycerin) .Marland Kitchen... 1 tablet under tongue at onset of chest pain; you may repeat every 5 minutes for up to 3 doses.    Aspirin 81 Mg Tbec (Aspirin) .Marland Kitchen... Take one tablet by mouth daily    Imdur 60 Mg Xr24h-tab (Isosorbide mononitrate) .Marland Kitchen... Take one tablet by mouth daily  Problem # 2:  VENTRICULAR TACHYCARDIA (ICD-427.1) no intercurrent ventricular tachycardia His updated medication list for this problem includes:    Carvedilol 12.5 Mg Tabs (Carvedilol) .Marland Kitchen... 1 tab by mouth two times a day    Nitrostat 0.4 Mg Subl (Nitroglycerin) .Marland Kitchen... 1 tablet under tongue at onset of chest pain; you may repeat every 5 minutes for up to 3 doses.    Aspirin 81 Mg Tbec (Aspirin) .Marland Kitchen... Take one tablet by mouth daily    Imdur 60 Mg Xr24h-tab (Isosorbide mononitrate)  .Marland Kitchen... Take one tablet by mouth daily  Orders: EKG w/ Interpretation (93000)  Problem # 3:  PACEMAKER, PERMANENT DDD MDT (ICD-V45.01) Device parameters and data were reviewed and rate response rhythm was activated  Problem # 4:  COMBINED HEART FAILURE, CHRONIC (ICD-428.42) as above His updated medication list for this problem includes:    Carvedilol 12.5 Mg Tabs (Carvedilol) .Marland Kitchen... 1 tab by mouth two times a day    Furosemide 40 Mg Tabs (Furosemide) .Marland Kitchen... Three tabs by mouth two times a day    Nitrostat 0.4 Mg Subl (Nitroglycerin) .Marland Kitchen... 1 tablet under tongue at onset of chest pain; you may repeat every 5 minutes for up to 3 doses.    Aspirin 81 Mg Tbec (Aspirin) .Marland Kitchen... Take one tablet by mouth daily    Imdur 60 Mg Xr24h-tab (Isosorbide mononitrate) .Marland Kitchen... Take one tablet by mouth daily  Problem # 5:  SINUS NODE DYSFUNCTION/CHRONOTROPIC INCOMPETENCE (ICD-427.81) his rate response about rhythm was activated hopefully this will translate and improved exercise performed His updated medication list for this problem includes:    Carvedilol 12.5 Mg Tabs (Carvedilol) .Marland Kitchen... 1 tab by mouth two times a day    Nitrostat 0.4 Mg Subl (Nitroglycerin) .Marland Kitchen... 1 tablet under tongue at onset of chest pain; you may repeat every 5 minutes for up to 3 doses.    Aspirin 81 Mg Tbec (Aspirin) .Marland Kitchen... Take one tablet by mouth daily    Imdur 60 Mg Xr24h-tab (Isosorbide  mononitrate) .Marland Kitchen... Take one tablet by mouth daily  Patient Instructions: 1)  You are scheduled for a device check from home on  June 20, 2010. You may send your transmission at any time that day. If you have a wireless device, the transmission will be sent automatically. After your physician reviews your transmission, you will receive a postcard with your next transmission date. 2)  Your physician wants you to follow-up in:12 MONTHS WITH DR Graciela Husbands.   You will receive a reminder letter in the mail two months in advance. If you don't receive a letter, please  call our office to schedule the follow-up appointment.

## 2010-10-08 NOTE — Letter (Signed)
Summary: [facility]  [facility]   Imported By: Maryln Gottron 12/12/2009 12:24:58  _____________________________________________________________________  External Attachment:    Type:   Image     Comment:   External Document

## 2010-10-08 NOTE — Assessment & Plan Note (Signed)
Summary: Acute NP office visit   Copy to:  Olga Millers Primary Provider/Referring Provider:  Gershon Crane  CC:  prod cough with off-white mucus, increased SOB x1week, and worse since Sunday - denies f/c/s.  History of Present Illness: 77  year old male with  known history of  pauci-immune vasculitis with pulmonary fibrosis, hypoxemia, and sleep apnea on nocturnal  BIPAP 8/5.  April 25, 2010--Presents for an acute office visit. Complains of productive cough, congesiton,  with off-white mucus, increased SOB x1week.  Last 2 days has worsened. OTC meds not helping. Denies chest pain, orthopnea, hemoptysis, fever, n/v/d, edema, headache.   Medications Prior to Update: 1)  Methotrexate 2.5 Mg Tabs (Methotrexate Sodium) .... 3 Pills Per Week 2)  Folic Acid 1 Mg Tabs (Folic Acid) .... One By Mouth Once Daily 3)  Carvedilol 12.5 Mg  Tabs (Carvedilol) .Marland Kitchen.. 1 Tab By Mouth Two Times A Day 4)  Lipitor 80 Mg Tabs (Atorvastatin Calcium) .Marland Kitchen.. 1 By Mouth Once Daily 5)  Potassium Chloride Crys Cr 20 Meq Cr-Tabs (Potassium Chloride Crys Cr) .... 2 Tabs Morning and 1 Tablet in The Afternoon 6)  Coq10 100 Mg  Caps (Coenzyme Q10) .Marland Kitchen.. 1 Once Daily 7)  Multivitamins   Tabs (Multiple Vitamin) .... Once Daily 8)  Furosemide 40 Mg Tabs (Furosemide) .... Three Tabs By Mouth Two Times A Day 9)  Calcium 600-D 600-400 Mg-Unit Tabs (Calcium Carbonate-Vitamin D) .... 2 By Mouth Daily 10)  Bipap .... At Bedtime 11)  Oxygen 4 L At Rest and  5 L Exertion 12)  Alprazolam 0.25 Mg Tabs (Alprazolam) .... One By Mouth Three Times A Day As Needed 13)  Nasonex 50 Mcg/act  Susp (Mometasone Furoate) .... Two Puffs Each Nostril Daily As Needed 14)  Proair Hfa 108 (90 Base) Mcg/act Aers (Albuterol Sulfate) .... Two Puffs Up To Four Times Per Day As Needed 15)  Nitrostat 0.4 Mg Subl (Nitroglycerin) .Marland Kitchen.. 1 Tablet Under Tongue At Onset of Chest Pain; You May Repeat Every 5 Minutes For Up To 3 Doses. 16)  Aspirin 81 Mg Tbec (Aspirin)  .... Take One Tablet By Mouth Daily 17)  Imdur 60 Mg Xr24h-Tab (Isosorbide Mononitrate) .... Take One Tablet By Mouth Daily  Current Medications (verified): 1)  Methotrexate 2.5 Mg Tabs (Methotrexate Sodium) .... 3 Pills Per Week 2)  Folic Acid 1 Mg Tabs (Folic Acid) .... One By Mouth Once Daily 3)  Carvedilol 12.5 Mg  Tabs (Carvedilol) .Marland Kitchen.. 1 Tab By Mouth Two Times A Day 4)  Lipitor 80 Mg Tabs (Atorvastatin Calcium) .Marland Kitchen.. 1 By Mouth Once Daily 5)  Potassium Chloride Crys Cr 20 Meq Cr-Tabs (Potassium Chloride Crys Cr) .... 2 Tabs Morning and 1 Tablet in The Afternoon 6)  Coq10 100 Mg  Caps (Coenzyme Q10) .Marland Kitchen.. 1 Once Daily 7)  Multivitamins   Tabs (Multiple Vitamin) .... Once Daily 8)  Furosemide 40 Mg Tabs (Furosemide) .... Three Tabs By Mouth Two Times A Day 9)  Calcium 600-D 600-400 Mg-Unit Tabs (Calcium Carbonate-Vitamin D) .... 2 By Mouth Daily 10)  Bipap .... At Bedtime 11)  Oxygen 4 L At Rest and  5 L Exertion 12)  Alprazolam 0.25 Mg Tabs (Alprazolam) .... One By Mouth Three Times A Day As Needed 13)  Nasonex 50 Mcg/act  Susp (Mometasone Furoate) .... Two Puffs Each Nostril Daily As Needed 14)  Proair Hfa 108 (90 Base) Mcg/act Aers (Albuterol Sulfate) .... Two Puffs Up To Four Times Per Day As Needed 15)  Nitrostat 0.4  Mg Subl (Nitroglycerin) .Marland Kitchen.. 1 Tablet Under Tongue At Onset of Chest Pain; You May Repeat Every 5 Minutes For Up To 3 Doses. 16)  Aspirin 81 Mg Tbec (Aspirin) .... Take One Tablet By Mouth Daily 17)  Imdur 60 Mg Xr24h-Tab (Isosorbide Mononitrate) .... Take One Tablet By Mouth Daily  Allergies (verified): 1)  ! Penicillin 2)  ! * Zetia 3)  ! Levaquin 4)  ! Cipro  Past History:  Past Medical History: Last updated: 01/29/2010 Pauci-immune vasculitis with pulmonary fibrosis      - Dr. Coralyn Helling      - 03/02 P-ANCA > 1:640, Myeloperoxidase Ab 17.6, ANA negative, RPR negative, RF < 20      - 07/09 ANCA < 1:20      - PFT 07/09 FEV1 1.90 (81%), FVC 2.66 (71%), TLC  5.08 (87%), DLCO 42%      - MTX started 06/18/09 Community acquired pneumonia Chronic hypoxemic respiratory failure      - 4 liters oxygen  Chronic rhinitis Prostate cancer s/p external radiation and hormonal therapy      - Dr. 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 Olga Millers                    - Echo 07/09 EF 50% Coronary artery disease Ventricular Tachycardia Second degree AV block, Mobitz Type II Mild Aortic regurgitation Hypertension Hyperlipidemia Anxiety Osteoporosis      - T12, L1, L2 compression fracture Obstructive sleep apnea      - PSG 01/08 AHI 11      - BPAP 9/5 cm H2O Right elbow bursitis Gout Thyroid cyst GERD  Past Surgical History: Last updated: 12/18/2008 CABG 02/01      - Dr. Sheliah Plane Left partial nephrectomy 04/02      - Dr. Jethro Bolus TURP Septoplasty Right tympanoplasty Left tympanic implant Pacemaker insertion 12/07 Kyphoplasty 04/09     - Dr. Colon Branch  Family History: Last updated: 12/18/2008 Father - CAD, TB Mother - Colon cancer  Social History: Last updated: 12/18/2008 Widowed.  Three children.  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  Occasional alcohol use.   Risk Factors: Smoking Status: quit (03/28/2010) Packs/Day: <1.0 (03/28/2010)  Review of Systems      See HPI  Vital Signs:  Patient profile:   77 year old male Height:      62 inches Weight:      155.13 pounds BMI:     28.48 O2 Sat:      92 % on 4 L/min cont Temp:     96.8 degrees F oral Pulse rate:   89 / minute BP sitting:   132 / 78  (right arm) Cuff size:   regular  Vitals Entered By: Boone Master CNA/MA (April 25, 2010 3:06 PM)  O2 Flow:  4 L/min cont CC: prod cough with off-white mucus, increased SOB x1week, worse since Sunday - denies f/c/s Is Patient Diabetic? No Comments Medications reviewed with patient Daytime  contact number verified with patient. Boone Master CNA/MA  April 25, 2010 3:07 PM    Physical Exam  Additional Exam:  GEN: A/Ox3; pleasant , NAD HEENT:  [place]/AT, , EACs-clear, TMs-wnl, NOSE-clear, THROAT-clear NECK:  Supple w/ fair ROM; no JVD; normal carotid impulses w/o bruits; no thyromegaly or nodules palpated; no  lymphadenopathy. RESP  Coarse BS w/ few rhonchi , no wheezing  CARD:  RRR, no m/r/g   GI:   Soft & nt; nml bowel sounds; no organomegaly or masses detected. [place]: Warm bil,  no calf tenderness clubbing, pulses intact , trace edema.  Neuro: intact   Impression & Recommendations:  Problem # 1:  BRONCHITIS, ACUTE (ICD-466.0)  xray pending xopenex neb in office  Plan:  Omnicef 300mg  two times a day for 7 days Mucinex DM two times a day as needed cough/congestion follow up Dr. Craige Cotta in 1-2 weeks as scheduled.  Please contact office for sooner follow up if symptoms do not improve or worsen  His updated medication list for this problem includes:    Proair Hfa 108 (90 Base) Mcg/act Aers (Albuterol sulfate) .Marland Kitchen..Marland Kitchen Two puffs up to four times per day as needed    Cefdinir 300 Mg Caps (Cefdinir) .Marland Kitchen... 1 by mouth two times a day  Orders: Est. Patient Level IV (16109)  Medications Added to Medication List This Visit: 1)  Cefdinir 300 Mg Caps (Cefdinir) .Marland Kitchen.. 1 by mouth two times a day  Complete Medication List: 1)  Methotrexate 2.5 Mg Tabs (Methotrexate sodium) .... 3 pills per week 2)  Folic Acid 1 Mg Tabs (Folic acid) .... One by mouth once daily 3)  Carvedilol 12.5 Mg Tabs (Carvedilol) .Marland Kitchen.. 1 tab by mouth two times a day 4)  Lipitor 80 Mg Tabs (Atorvastatin calcium) .Marland Kitchen.. 1 by mouth once daily 5)  Potassium Chloride Crys Cr 20 Meq Cr-tabs (Potassium chloride crys cr) .... 2 tabs morning and 1 tablet in the afternoon 6)  Coq10 100 Mg Caps (Coenzyme q10) .Marland Kitchen.. 1 once daily 7)  Multivitamins Tabs (Multiple vitamin) .... Once daily 8)  Furosemide 40 Mg Tabs (Furosemide) ....  Three tabs by mouth two times a day 9)  Calcium 600-d 600-400 Mg-unit Tabs (Calcium carbonate-vitamin d) .... 2 by mouth daily 10)  Bipap  .... At bedtime 11)  Oxygen 4 L At Rest and 5 L Exertion  12)  Alprazolam 0.25 Mg Tabs (Alprazolam) .... One by mouth three times a day as needed 13)  Nasonex 50 Mcg/act Susp (Mometasone furoate) .... Two puffs each nostril daily as needed 14)  Proair Hfa 108 (90 Base) Mcg/act Aers (Albuterol sulfate) .... Two puffs up to four times per day as needed 15)  Nitrostat 0.4 Mg Subl (Nitroglycerin) .Marland Kitchen.. 1 tablet under tongue at onset of chest pain; you may repeat every 5 minutes for up to 3 doses. 16)  Aspirin 81 Mg Tbec (Aspirin) .... Take one tablet by mouth daily 17)  Imdur 60 Mg Xr24h-tab (Isosorbide mononitrate) .... Take one tablet by mouth daily 18)  Cefdinir 300 Mg Caps (Cefdinir) .Marland Kitchen.. 1 by mouth two times a day  Other Orders: T-2 View CXR (71020TC)  Patient Instructions: 1)  I will call with xray results.  2)  Omnicef 300mg  two times a day for 7 days 3)  Mucinex DM two times a day as needed cough/congestion 4)  follow up Dr. Craige Cotta in 1-2 weeks as scheduled.  5)  Please contact office for sooner follow up if symptoms do not improve or worsen  Prescriptions: ALPRAZOLAM 0.25 MG TABS (ALPRAZOLAM) one by mouth three times a day as needed  #30 x 0   Entered and Authorized by:   Rubye Oaks NP   Signed by:   Tammy Parrett NP on 04/25/2010   Method used:   Print then Give to Patient  RxID:   [id number] CEFDINIR 300 MG CAPS (CEFDINIR) 1 by mouth two times a day  #14 x 0   Entered and Authorized by:   Rubye Oaks NP   Signed by:   Tammy Parrett NP on 04/25/2010   Method used:   Electronically to        [id number] (retail)       [address]       [place], [place]  [address]       Ph: [phone]       Fax: [phone]   RxID:   [id number]

## 2010-10-08 NOTE — Progress Notes (Signed)
Summary: talk to nurse  Phone Note Call from Patient Call back at Home Phone [phone]   Caller: Patient Call For: sood Reason for Call: Talk to Nurse Summary of Call: States he was supposed to be set up for auto bpap titration, but he still hasn't heard anything, pls advise. Initial call taken by: Darletta Moll,  March 14, 2010 9:00 AM  Follow-up for Phone Call        Will defer to [facility]. Zackery Barefoot CMA  March 14, 2010 9:49 AM   Additional Follow-up for Phone Call Additional follow up Details #1::        called [email or web address]   Pt is aware we are working on this for him and he should hear from [facility] soon.Michel Bickers [facility]  March 14, 2010 10:42 AM

## 2010-10-08 NOTE — Assessment & Plan Note (Signed)
Summary: per check out/sf    Referring Provider:  Olga Millers Primary Provider:  Gershon Crane  CC:  sob.  History of Present Illness: [name] is a pleasant gentleman who has a history of coronary artery disease status post coronary bypass and graft, ischemic cardiomyopathy, combined systolic/diastolic heart failure, and history of pacemaker placement.  He also has pulmonary fibrosis and vasculitis.  His most recent Myoview was performed on April 27, 2007.  At that time, the ejection fraction was 40%.  There was a prior inferior infarct and trivial peri-infarct ischemia. An echocardiogram performed on March 13, 2008 showed an ejection fraction of 50%. There was mild aortic insufficiency. I last saw him in November of 2010. Since then he continues to have significant dyspnea on exertion relieved with rest. There is no orthopnea or PND. He has mild pedal edema typically controlled with diuretics. He occasionally feels "indigestion". However he does not have exertional chest pain. There is no syncope.  Current Medications (verified): 1)  Prednisone 5 Mg Tabs (Prednisone) .Marland Kitchen.. 1 By Mouth Every Other Day 2)  Methotrexate 2.5 Mg Tabs (Methotrexate Sodium) .... 4 Pills Once Per Week 3)  Folic Acid 1 Mg Tabs (Folic Acid) .... One By Mouth Once Daily 4)  Carvedilol 12.5 Mg  Tabs (Carvedilol) .Marland Kitchen.. 1 Tab By Mouth Two Times A Day 5)  Lipitor 80 Mg Tabs (Atorvastatin Calcium) .Marland Kitchen.. 1 By Mouth Once Daily 6)  Potassium Chloride Crys Cr 20 Meq Cr-Tabs (Potassium Chloride Crys Cr) .... 2 Tabs Morning and 1 Tablet in The Afternoon 7)  Coq10 100 Mg  Caps (Coenzyme Q10) .Marland Kitchen.. 1 Once Daily 8)  Multivitamins   Tabs (Multiple Vitamin) .... Once Daily 9)  Furosemide 40 Mg Tabs (Furosemide) .... Three Tabs By Mouth Two Times A Day 10)  Calcium 600-D 600-400 Mg-Unit Tabs (Calcium Carbonate-Vitamin D) .... 2 By Mouth Daily 11)  Actonel 35 Mg Tabs (Risedronate Sodium) .Marland Kitchen.. 1 Tablet By Mouth Every Week 12)  Bipap .... At  Bedtime 13)  Oxygen 4 Liters .... 24/7 14)  Alprazolam 0.25 Mg Tabs (Alprazolam) .... One By Mouth Three Times A Day As Needed 15)  Nasonex 50 Mcg/act  Susp (Mometasone Furoate) .... Two Puffs Each Nostril Daily As Needed 16)  Imdur 60 Mg Xr24h-Tab (Isosorbide Mononitrate) .... Once Daily 17)  Proair Hfa 108 (90 Base) Mcg/act Aers (Albuterol Sulfate) .... Two Puffs Up To Four Times Per Day As Needed 18)  Nitrostat 0.4 Mg Subl (Nitroglycerin) .Marland Kitchen.. 1 Tablet Under Tongue At Onset of Chest Pain; You May Repeat Every 5 Minutes For Up To 3 Doses. 19)  Aspirin 81 Mg Tbec (Aspirin) .... Take One Tablet By Mouth Daily  Allergies: 1)  ! Penicillin 2)  ! * Zetia 3)  ! Levaquin 4)  ! 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 Coralyn Helling      - 03/02 P-ANCA > 1:640, Myeloperoxidase Ab 17.6, ANA negative, RPR negative, RF < 20      - 07/09 ANCA < 1:20      - PFT 07/09 FEV1 1.90 (81%), FVC 2.66 (71%), TLC 5.08 (87%), DLCO 42%      - MTX started 06/18/09 Community acquired pneumonia Chronic hypoxemic respiratory failure      - 4 liters oxygen  Chronic rhinitis Prostate cancer s/p external radiation and hormonal therapy      - Dr. 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 Olga Millers                    - Echo 07/09 EF 50% Coronary artery disease Ventricular Tachycardia Second degree AV block, Mobitz Type II Mild Aortic regurgitation Hypertension Hyperlipidemia Anxiety Osteoporosis      - T12, L1, L2 compression fracture Obstructive sleep apnea      - PSG 01/08 AHI 11      - BPAP 9/5 cm H2O Right elbow bursitis Gout Thyroid cyst GERD  Past Surgical History: Reviewed history from 12/18/2008 and no changes required. CABG 02/01      - Dr. Sheliah Plane Left partial nephrectomy 04/02       - Dr. Jethro Bolus TURP Septoplasty Right tympanoplasty Left tympanic implant Pacemaker insertion 12/07 Kyphoplasty 04/09     - Dr. Colon Branch  Social History: Reviewed history from 12/18/2008 and no changes required. Widowed.  Three children.  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  Occasional alcohol use.   Review of Systems       Chronic productive cough but no fevers or chills,  hemoptysis, dysphasia, odynophagia, melena, hematochezia, dysuria, hematuria, rash, seizure activity, orthopnea, PND,  claudication. Remaining systems are negative.   Vital Signs:  Patient profile:   77 year old male Height:      65 inches Weight:      175 pounds Pulse rate:   74 / minute Resp:     12 per minute BP sitting:   143 / 82  (left arm)  Vitals Entered By: Kem Parkinson (October 17, 2009 9:58 AM) CC: sob Comments Prednisone was decreased to 2.5 mg every other day  Furosemide 40mg   2 tabs by mouth two times a day     Physical Exam  General:  Well-developed chronically ill in no acute distress.  Skin is warm and dry.  HEENT is significant for left eye enucleation. Neck is supple. No thyromegaly.  Chest is significant for bilateral dry crackles Cardiovascular exam is regular rate and rhythm.  Abdominal exam nontender or distended. No masses palpated. Extremities show trace to 1+ edema. neuro grossly intact    EKG  Procedure date:  10/17/2009  Findings:      Atrial paced rhythm at a rate of 75. Left ventricular hypertrophy. Left axis deviation.  PPM Specifications Following MD:  Sherryl Manges, MD     Referring MD:  Cecille Po Vendor:  [employer]     PPM Model Number:  [id number]     PPM Serial Number:  [id number] PPM DOI:  08/11/2006     PPM Implanting MD:  Sherryl Manges, MD  Lead 1    Location: [facility]     DOI: 08/11/2006     Model #: [id number]     Serial #: [id number]     Status: active Lead 2    Location: [facility]     DOI: 08/11/2006     Model #: [id number]     Serial #:  [id number]     Status: active   Indications:  MOBITZ II   PPM Follow Up Pacer Dependent:  No      Episodes Coumadin:  No  Parameters Mode:  DDD+     Lower Rate Limit:  75     Upper Rate Limit:  130 Paced AV  Delay:  180     Sensed AV Delay:  150  Impression & Recommendations:  Problem # 1:  COMBINED HEART FAILURE, CHRONIC (ICD-428.42) Patient is euvolemic on examination. Continue present dose of diuretics. Check Bmet. His updated medication list for this problem includes:    Carvedilol 12.5 Mg Tabs (Carvedilol) .Marland Kitchen... 1 tab by mouth two times a day    Furosemide 40 Mg Tabs (Furosemide) .Marland Kitchen... Three tabs by mouth two times a day    Imdur 60 Mg Xr24h-tab (Isosorbide mononitrate) ..... Once daily    Nitrostat 0.4 Mg Subl (Nitroglycerin) .Marland Kitchen... 1 tablet under tongue at onset of chest pain; you may repeat every 5 minutes for up to 3 doses.    Aspirin 81 Mg Tbec (Aspirin) .Marland Kitchen... Take one tablet by mouth daily  Problem # 2:  PACEMAKER, PERMANENT DDD MDT (ICD-V45.01) Management per EP.  Problem # 3:  CAD, S/P CABG EF 45% (ICD-414.00) Continue aspirin, beta blocker and statin. He does not wish to pursue myoview at this point. His updated medication list for this problem includes:    Carvedilol 12.5 Mg Tabs (Carvedilol) .Marland Kitchen... 1 tab by mouth two times a day    Imdur 60 Mg Xr24h-tab (Isosorbide mononitrate) ..... Once daily    Nitrostat 0.4 Mg Subl (Nitroglycerin) .Marland Kitchen... 1 tablet under tongue at onset of chest pain; you may repeat every 5 minutes for up to 3 doses.    Aspirin 81 Mg Tbec (Aspirin) .Marland Kitchen... Take one tablet by mouth daily  Problem # 4:  POSTINFLAMMATORY PULMONARY FIBROSIS (ICD-515) Management per pulmonary.  Problem # 5:  CAROTID STENOSIS (ICD-433.10) Continue aspirin and statin. His updated medication list for this problem includes:    Aspirin 81 Mg Tbec (Aspirin) .Marland Kitchen... Take one tablet by mouth daily  Problem # 6:  HYPERTENSION (ICD-401.9) Blood pressure controlled on present  medications. Will continue. His updated medication list for this problem includes:    Carvedilol 12.5 Mg Tabs (Carvedilol) .Marland Kitchen... 1 tab by mouth two times a day    Furosemide 40 Mg Tabs (Furosemide) .Marland Kitchen... Three tabs by mouth two times a day    Aspirin 81 Mg Tbec (Aspirin) .Marland Kitchen... Take one tablet by mouth daily  Orders: TLB-BMP (Basic Metabolic Panel-BMET) (80048-METABOL)  Problem # 7:  HYPERLIPIDEMIA (ICD-272.4) Continue statin. Check lipids and liver. His updated medication list for this problem includes:    Lipitor 80 Mg Tabs (Atorvastatin calcium) .Marland Kitchen... 1 by mouth once daily  Orders: TLB-Lipid Panel (80061-LIPID) TLB-Hepatic/Liver Function Pnl (80076-HEPATIC)  Problem # 8:  GERD (ICD-530.81)  Problem # 9:  DYSPNEA (ICD-786.05) I think this is predominantly related to his pulmonary fibrosis. His updated medication list for this problem includes:    Carvedilol 12.5 Mg Tabs (Carvedilol) .Marland Kitchen... 1 tab by mouth two times a day    Furosemide 40 Mg Tabs (Furosemide) .Marland Kitchen... Three tabs by mouth two times a day    Aspirin 81 Mg Tbec (Aspirin) .Marland Kitchen... Take one tablet by mouth daily  Other Orders: EKG w/ Interpretation (93000)  Patient Instructions: 1)  Your physician recommends that you schedule a follow-up appointment in: 6 MONTHS

## 2010-10-08 NOTE — Assessment & Plan Note (Signed)
Summary: rov 4 wks ///kp   Copy to:  Olga Millers Primary Provider/Referring Provider:  Gershon Crane  CC:  Pulmonary fibrosis.  Patient says there is no change in his breathing.Marland Kitchen  History of Present Illness: 77  year old male with  known history of  pauci-immune vasculitis with pulmonary fibrosis, hypoxemia, and sleep apnea on nocturnal  BIPAP 9/5.  He is on prednisone 5 mg once daily.  He feels about the same.  He is having more back pain, and thinks this is causing more difficulty with his breathing.  He has a cough with milky sputum.  He denies fever or hemoptysis.  He is not having abdominal pain, and has not noticed any blood in his stool.  He is not having any trouble using his BPAP.  Current Medications (verified): 1)  Prednisone 5 Mg Tabs (Prednisone) .Marland Kitchen.. 1 By Mouth Daily 2)  Methotrexate 2.5 Mg Tabs (Methotrexate Sodium) .... 4 Pills Once Per Week 3)  Folic Acid 1 Mg Tabs (Folic Acid) .... One By Mouth Once Daily 4)  Carvedilol 12.5 Mg  Tabs (Carvedilol) .Marland Kitchen.. 1 Tab By Mouth Two Times A Day 5)  Lipitor 80 Mg Tabs (Atorvastatin Calcium) .Marland Kitchen.. 1 By Mouth Once Daily 6)  Potassium Chloride Crys Cr 20 Meq Cr-Tabs (Potassium Chloride Crys Cr) .... 2 Tabs Morning and 1 Tablet in The Afternoon 7)  Coq10 100 Mg  Caps (Coenzyme Q10) .Marland Kitchen.. 1 Once Daily 8)  Bl Vitamin C 1000 Mg  Tabs (Ascorbic Acid) .... Once Daily 9)  Multivitamins   Tabs (Multiple Vitamin) .... Once Daily 10)  Vitamin D 800mg  .... 1 By Mouth Once Daily 11)  Furosemide 40 Mg Tabs (Furosemide) .... Three Tabs By Mouth Two Times A Day 12)  Calcium 1200-1000 Mg-Unit Chew (Calcium Carbonate-Vit D-Min) .Marland Kitchen.. 1 Tab By Mouth Once Daily 13)  Actonel 35 Mg Tabs (Risedronate Sodium) .Marland Kitchen.. 1 Tablet By Mouth Every Week 14)  Bipap .... At Bedtime 15)  Oxygen 4 Liters .... 24/7 16)  Alprazolam 0.25 Mg Tabs (Alprazolam) .... One By Mouth Three Times A Day As Needed 17)  Nasonex 50 Mcg/act  Susp (Mometasone Furoate) .... Two Puffs Each  Nostril Daily As Needed 18)  Imdur 60 Mg Xr24h-Tab (Isosorbide Mononitrate) .... Once Daily 19)  Proair Hfa 108 (90 Base) Mcg/act Aers (Albuterol Sulfate) .... Two Puffs Up To Four Times Per Day As Needed 20)  Nitrostat 0.4 Mg Subl (Nitroglycerin) .Marland Kitchen.. 1 Tablet Under Tongue At Onset of Chest Pain; You May Repeat Every 5 Minutes For Up To 3 Doses. 21)  Aspirin 81 Mg Tbec (Aspirin) .... Take One Tablet By Mouth Daily  Allergies (verified): 1)  ! Penicillin 2)  ! * Zetia 3)  ! Levaquin 4)  ! Cipro  Past History:  Past Medical History: Reviewed history from 06/18/2009 and no changes required. 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 Coralyn Helling      - 03/02 P-ANCA > 1:640, Myeloperoxidase Ab 17.6, ANA negative, RPR negative, RF < 20      - 07/09 ANCA < 1:20      - PFT 07/09 FEV1 1.90 (81%), FVC 2.66 (71%), TLC 5.08 (87%), DLCO 42%      - MTX started 06/18/09 Community acquired pneumonia Chronic hypoxemic respiratory failure      - 4 liters oxygen  Chronic rhinitis Prostate cancer s/p external radiation and hormonal therapy      - Dr. Jethro Bolus  Renal cell carcinoma Left eye tumor s/p enucleation age 68 Chronic systolic congestive heart failure                   - Dr. Olga Millers                    - Echo 07/09 EF 50% Coronary artery disease Ventricular Tachycardia Second degree AV block, Mobitz Type II Mild Aortic regurgitation Carotid bruit Hypertension Hyperlipidemia Urticaria Right leg cellulitis Anxiety Osteoporosis      - T12, L1, L2 compression fracture Obstructive sleep apnea      - PSG 01/08 AHI 11      - BPAP 9/5 cm H2O Right elbow bursitis Gout Thyroid cyst GERD  Past Surgical History: Reviewed history from 12/18/2008 and no changes required. CABG 02/01      - Dr. Sheliah Plane Left partial nephrectomy 04/02      - Dr. Jethro Bolus TURP Septoplasty Right tympanoplasty Left tympanic  implant Pacemaker insertion 12/07 Kyphoplasty 04/09     - Dr. Colon Branch  Vital Signs:  Patient profile:   77 year old male Height:      65 inches (165.10 cm) Weight:      180 pounds (81.82 kg) O2 Sat:      95 % on 4 L/min Temp:     97.4 degrees F (36.33 degrees C) oral Pulse rate:   80 / minute BP sitting:   108 / 70  (right arm) Cuff size:   regular  Vitals Entered By: Michel Bickers CMA (September 17, 2009 1:33 PM)  O2 Flow:  4 L/min  Physical Exam  General:  on supplemental oxygen.   Nose:  clear nasal discharge.   Mouth:  no deformity or lesions Neck:  no JVD.   Lungs:  basilar rales less prominent, no wheezing or rhonchi Heart:  regular rate and rhythm, S1, S2 without murmurs, rubs, gallops, or clicks Abdomen:  obese, soft, mild distention, normal bowel sounds Extremities:  no edema Cervical Nodes:  no significant adenopathy   Impression & Recommendations:  Problem # 1:  POSTINFLAMMATORY PULMONARY FIBROSIS (ICD-515) He has some clinical improvement since the addition of MTX.  Will check his labs, and if these are okay will continue to wean his prednisone as tolerated.  Problem # 2:  HYPOXEMIA (ICD-799.02) He is to continue on supplemental oxygen.  Problem # 3:  OBSTRUCTIVE SLEEP APNEA (ICD-327.23) He is to continue on BPAP.  Problem # 4:  BACK PAIN (ICD-724.5) Advised him to follow up with his PCP for further assessment of this.  Medications Added to Medication List This Visit: 1)  Prednisone 5 Mg Tabs (Prednisone) .Marland Kitchen.. 1 by mouth daily  Complete Medication List: 1)  Prednisone 5 Mg Tabs (Prednisone) .Marland Kitchen.. 1 by mouth daily 2)  Methotrexate 2.5 Mg Tabs (Methotrexate sodium) .... 4 pills once per week 3)  Folic Acid 1 Mg Tabs (Folic acid) .... One by mouth once daily 4)  Carvedilol 12.5 Mg Tabs (Carvedilol) .Marland Kitchen.. 1 tab by mouth two times a day 5)  Lipitor 80 Mg Tabs (Atorvastatin calcium) .Marland Kitchen.. 1 by mouth once daily 6)  Potassium Chloride Crys Cr 20 Meq Cr-tabs  (Potassium chloride crys cr) .... 2 tabs morning and 1 tablet in the afternoon 7)  Coq10 100 Mg Caps (Coenzyme q10) .Marland Kitchen.. 1 once daily 8)  Bl Vitamin C 1000 Mg Tabs (Ascorbic acid) .... Once daily 9)  Multivitamins Tabs (Multiple vitamin) .... Once daily 10)  Vitamin D 800mg   .... 1 by mouth once daily 11)  Furosemide 40 Mg Tabs (Furosemide) .... Three tabs by mouth two times a day 12)  Calcium 1200-1000 Mg-unit Chew (Calcium carbonate-vit d-min) .Marland Kitchen.. 1 tab by mouth once daily 13)  Actonel 35 Mg Tabs (Risedronate sodium) .Marland Kitchen.. 1 tablet by mouth every week 14)  Bipap  .... At bedtime 15)  Oxygen 4 Liters  .... 24/7 16)  Alprazolam 0.25 Mg Tabs (Alprazolam) .... One by mouth three times a day as needed 17)  Nasonex 50 Mcg/act Susp (Mometasone furoate) .... Two puffs each nostril daily as needed 18)  Imdur 60 Mg Xr24h-tab (Isosorbide mononitrate) .... Once daily 19)  Proair Hfa 108 (90 Base) Mcg/act Aers (Albuterol sulfate) .... Two puffs up to four times per day as needed 20)  Nitrostat 0.4 Mg Subl (Nitroglycerin) .Marland Kitchen.. 1 tablet under tongue at onset of chest pain; you may repeat every 5 minutes for up to 3 doses. 21)  Aspirin 81 Mg Tbec (Aspirin) .... Take one tablet by mouth daily  Other Orders: TLB-BMP (Basic Metabolic Panel-BMET) (80048-METABOL) TLB-CBC Platelet - w/Differential (85025-CBCD) TLB-Hepatic/Liver Function Pnl (80076-HEPATIC) TLB-BNP (B-Natriuretic Peptide) (83880-BNPR) Est. Patient Level III (16109) Influenza Vaccine MCR (60454)  Patient Instructions: 1)  Lab tests today 2)  Follow up in 4 to 6 weeks   Immunizations Administered:  Influenza Vaccine # 1:    Vaccine Type: Fluvax MCR    Site: left deltoid    Mfr: [employer]    Dose: 0.5 ml    Route: IM    Given by: Michel Bickers CMA    Exp. Date: 12/06/2009    Lot #: [id number]    VIS given: 04/01/07 version given September 17, 2009.  Flu Vaccine Consent Questions:    Do you have a history of severe allergic reactions to  this vaccine? no    Any prior history of allergic reactions to egg and/or gelatin? no    Do you have a sensitivity to the preservative Thimersol? no    Do you have a past history of Guillan-Barre Syndrome? no    Do you currently have an acute febrile illness? no    Have you ever had a severe reaction to latex? no    Vaccine information given and explained to patient? yes

## 2010-10-08 NOTE — Assessment & Plan Note (Signed)
Summary: 2 months/apc   Visit Type:  Follow-up Copy to:  Olga Millers Primary Provider/Referring Provider:  Gershon Crane  CC:  Pulmonary fibrosis.  OSA. The patient c/o increased sob with exertion and cough that is productive. He is having trouble with his bipap due to air coming through his ear canal..  History of Present Illness: 77  year old male with  known history of  pauci-immune vasculitis with pulmonary fibrosis, hypoxemia, and sleep apnea on nocturnal  BIPAP 8/5.  He continues to get winded easily.  He recovers his breathing after he rests for a while.  He gets anxious when he gets winded, and this makes his breathing worse.  He has tried using xanax, and this helps.  He has not been using xanax that often.  He has occasional cough, and only had sputum production once.  He is not sure what the color of the sputum was.  He denies wheezing, or fever.  He had recent nuclear stress test which was okay.  He had chest xray from April which showed stable fibrotic changes.   Current Medications (verified): 1)  Methotrexate 2.5 Mg Tabs (Methotrexate Sodium) .... 4 Pills Once Per Week 2)  Folic Acid 1 Mg Tabs (Folic Acid) .... One By Mouth Once Daily 3)  Carvedilol 12.5 Mg  Tabs (Carvedilol) .Marland Kitchen.. 1 Tab By Mouth Two Times A Day 4)  Lipitor 80 Mg Tabs (Atorvastatin Calcium) .Marland Kitchen.. 1 By Mouth Once Daily 5)  Potassium Chloride Crys Cr 20 Meq Cr-Tabs (Potassium Chloride Crys Cr) .... 2 Tabs Morning and 1 Tablet in The Afternoon 6)  Coq10 100 Mg  Caps (Coenzyme Q10) .Marland Kitchen.. 1 Once Daily 7)  Multivitamins   Tabs (Multiple Vitamin) .... Once Daily 8)  Furosemide 40 Mg Tabs (Furosemide) .... Three Tabs By Mouth Two Times A Day 9)  Calcium 600-D 600-400 Mg-Unit Tabs (Calcium Carbonate-Vitamin D) .... 2 By Mouth Daily 10)  Bipap .... At Bedtime 11)  Oxygen 4 L At Rest and  5 L Exertion 12)  Alprazolam 0.25 Mg Tabs (Alprazolam) .... One By Mouth Three Times A Day As Needed 13)  Nasonex 50 Mcg/act   Susp (Mometasone Furoate) .... Two Puffs Each Nostril Daily As Needed 14)  Proair Hfa 108 (90 Base) Mcg/act Aers (Albuterol Sulfate) .... Two Puffs Up To Four Times Per Day As Needed 15)  Nitrostat 0.4 Mg Subl (Nitroglycerin) .Marland Kitchen.. 1 Tablet Under Tongue At Onset of Chest Pain; You May Repeat Every 5 Minutes For Up To 3 Doses. 16)  Aspirin 81 Mg Tbec (Aspirin) .... Take One Tablet By Mouth Daily  Allergies (verified): 1)  ! Penicillin 2)  ! * Zetia 3)  ! Levaquin 4)  ! Cipro  Past History:  Past Medical History: Pauci-immune vasculitis with pulmonary fibrosis      - Dr. Coralyn Helling      - 03/02 P-ANCA > 1:640, Myeloperoxidase Ab 17.6, ANA negative, RPR negative, RF < 20      - 07/09 ANCA < 1:20      - PFT 07/09 FEV1 1.90 (81%), FVC 2.66 (71%), TLC 5.08 (87%), DLCO 42%      - MTX started 06/18/09 Community acquired pneumonia Chronic hypoxemic respiratory failure      - 4 liters oxygen  Chronic rhinitis Prostate cancer s/p external radiation and hormonal therapy      - Dr. Jethro Bolus Renal cell carcinoma Left eye tumor s/p enucleation age 67 Chronic systolic congestive heart failure                   -  Dr. Olga Millers                    - Echo 07/09 EF 50% Coronary artery disease Ventricular Tachycardia Second degree AV block, Mobitz Type II Mild Aortic regurgitation Hypertension Hyperlipidemia Anxiety Osteoporosis      - T12, L1, L2 compression fracture Obstructive sleep apnea      - PSG 01/08 AHI 11      - BPAP 9/5 cm H2O Right elbow bursitis Gout Thyroid cyst GERD  Past Surgical History: Reviewed history from 12/18/2008 and no changes required. CABG 02/01      - Dr. Sheliah Plane Left partial nephrectomy 04/02      - Dr. Jethro Bolus TURP Septoplasty Right tympanoplasty Left tympanic implant Pacemaker insertion 12/07 Kyphoplasty 04/09     - Dr. Colon Branch  Vital Signs:  Patient profile:   77 year old male Height:      62 inches  (157.48 cm) Weight:      164 pounds (74.55 kg) BMI:     30.10 O2 Sat:      96 % on Room air Temp:     97.5 degrees F (36.39 degrees C) oral Pulse rate:   75 / minute BP sitting:   102 / 58  (right arm) Cuff size:   regular  Vitals Entered By: Michel Bickers CMA (Jan 29, 2010 1:44 PM)  O2 Sat at Rest %:  96 O2 Flow:  Room air CC: Pulmonary fibrosis.  OSA. The patient c/o increased sob with exertion and cough that is productive. He is having trouble with his bipap due to air coming through his ear canal. Comments Medications reviewed. Daytime phone verified. Michel Bickers CMA  Jan 29, 2010 1:45 PM   Physical Exam  General:  on supplemental oxygen.   Nose:  clear nasal discharge.   Mouth:  no deformity or lesions Neck:  no JVD.   Lungs:  basilar rales, no wheezing or rhonchi Heart:  regular rate and rhythm, S1, S2 without murmurs, rubs, gallops, or clicks Extremities:  no edema Cervical Nodes:  no significant adenopathy   Impression & Recommendations:  Problem # 1:  POSTINFLAMMATORY PULMONARY FIBROSIS (ICD-515) His recent chest xray and CT chest have not shown much change in his fibrosis.  I am not sure how much benefit he is getting from methotrexate.  Will try to gradually wean down his methotrexate dose.  Explained that he has chronic fibrosis, and will always have some degree of breathing problems.  Problem # 2:  ANXIETY DISORDER (ICD-300.00)  Advised him to try using his xanax more often.  Orders: Est. Patient Level III (16109)  Problem # 3:  HYPOXEMIA (ICD-799.02)  He is to continue on supplemental oxygen.  Orders: Est. Patient Level III (60454)  Problem # 4:  OBSTRUCTIVE SLEEP APNEA (ICD-327.23)  He has been unable to tolerate BPAP due to air leaking from his ear.  I have advised him to f/u with ENT to further assess his ear problem before resuming BPAP.  Orders: Est. Patient Level III (09811)  Medications Added to Medication List This Visit: 1)   Methotrexate 2.5 Mg Tabs (Methotrexate sodium) .... 3 pills once per week  Complete Medication List: 1)  Methotrexate 2.5 Mg Tabs (Methotrexate sodium) .... 3 pills once per week 2)  Folic Acid 1 Mg Tabs (Folic acid) .... One by mouth once daily 3)  Carvedilol 12.5 Mg Tabs (Carvedilol) .Marland Kitchen.. 1 tab by mouth two times a day 4)  Lipitor 80 Mg Tabs (Atorvastatin calcium) .Marland Kitchen.. 1 by mouth once daily 5)  Potassium Chloride Crys Cr 20 Meq Cr-tabs (Potassium chloride crys cr) .... 2 tabs morning and 1 tablet in the afternoon 6)  Coq10 100 Mg Caps (Coenzyme q10) .Marland Kitchen.. 1 once daily 7)  Multivitamins Tabs (Multiple vitamin) .... Once daily 8)  Furosemide 40 Mg Tabs (Furosemide) .... Three tabs by mouth two times a day 9)  Calcium 600-d 600-400 Mg-unit Tabs (Calcium carbonate-vitamin d) .... 2 by mouth daily 10)  Bipap  .... At bedtime 11)  Oxygen 4 L At Rest and 5 L Exertion  12)  Alprazolam 0.25 Mg Tabs (Alprazolam) .... One by mouth three times a day as needed 13)  Nasonex 50 Mcg/act Susp (Mometasone furoate) .... Two puffs each nostril daily as needed 14)  Proair Hfa 108 (90 Base) Mcg/act Aers (Albuterol sulfate) .... Two puffs up to four times per day as needed 15)  Nitrostat 0.4 Mg Subl (Nitroglycerin) .Marland Kitchen.. 1 tablet under tongue at onset of chest pain; you may repeat every 5 minutes for up to 3 doses. 16)  Aspirin 81 Mg Tbec (Aspirin) .... Take one tablet by mouth daily  Patient Instructions: 1)  Change methotrexate to 3 pills per week 2)  Try using alprazolam more often 3)  Talk to Dr. Pollyann Kennedy about ear problem 4)  Follow up in 4 to 6 weeks

## 2010-10-08 NOTE — Assessment & Plan Note (Signed)
Summary: rov 4-6 wks ///kp   Visit Type:  Follow-up Copy to:  Olga Millers Primary Provider/Referring Provider:  Gershon Crane  CC:  Patient here for follow-up. Patient c/o increased sob with exertion. Wears bipap nightly and sleep 5-6 hours..  History of Present Illness: 77  year old male with  known history of  pauci-immune vasculitis with pulmonary fibrosis, hypoxemia, and sleep apnea on nocturnal  BIPAP 8/5.  He has lost about 25 lbs.  He does not have much of an appetite.  He has occasional cough, and started using mucinex to help with chest congestion.  He has not been able to tolerate using BPAP.  He feels like the pressure is too much.  His breathing has not gotten any worse since he decreased the dose of methotrexate.   Current Medications (verified): 1)  Methotrexate 2.5 Mg Tabs (Methotrexate Sodium) .... 3 Pills Once Per Week 2)  Folic Acid 1 Mg Tabs (Folic Acid) .... One By Mouth Once Daily 3)  Carvedilol 12.5 Mg  Tabs (Carvedilol) .Marland Kitchen.. 1 Tab By Mouth Two Times A Day 4)  Lipitor 80 Mg Tabs (Atorvastatin Calcium) .Marland Kitchen.. 1 By Mouth Once Daily 5)  Potassium Chloride Crys Cr 20 Meq Cr-Tabs (Potassium Chloride Crys Cr) .... 2 Tabs Morning and 1 Tablet in The Afternoon 6)  Coq10 100 Mg  Caps (Coenzyme Q10) .Marland Kitchen.. 1 Once Daily 7)  Multivitamins   Tabs (Multiple Vitamin) .... Once Daily 8)  Furosemide 40 Mg Tabs (Furosemide) .... Three Tabs By Mouth Two Times A Day 9)  Calcium 600-D 600-400 Mg-Unit Tabs (Calcium Carbonate-Vitamin D) .... 2 By Mouth Daily 10)  Bipap .... At Bedtime 11)  Oxygen 4 L At Rest and  5 L Exertion 12)  Alprazolam 0.25 Mg Tabs (Alprazolam) .... One By Mouth Three Times A Day As Needed 13)  Nasonex 50 Mcg/act  Susp (Mometasone Furoate) .... Two Puffs Each Nostril Daily As Needed 14)  Proair Hfa 108 (90 Base) Mcg/act Aers (Albuterol Sulfate) .... Two Puffs Up To Four Times Per Day As Needed 15)  Nitrostat 0.4 Mg Subl (Nitroglycerin) .Marland Kitchen.. 1 Tablet Under Tongue At  Onset of Chest Pain; You May Repeat Every 5 Minutes For Up To 3 Doses. 16)  Aspirin 81 Mg Tbec (Aspirin) .... Take One Tablet By Mouth Daily 17)  Imdur 60 Mg Xr24h-Tab (Isosorbide Mononitrate) .... Take One Tablet By Mouth Daily  Allergies (verified): 1)  ! Penicillin 2)  ! * Zetia 3)  ! Levaquin 4)  ! Cipro  Past History:  Past Medical History: Reviewed history from 01/29/2010 and no changes required. Pauci-immune vasculitis with pulmonary fibrosis      - Dr. Coralyn Helling      - 03/02 P-ANCA > 1:640, Myeloperoxidase Ab 17.6, ANA negative, RPR negative, RF < 20      - 07/09 ANCA < 1:20      - PFT 07/09 FEV1 1.90 (81%), FVC 2.66 (71%), TLC 5.08 (87%), DLCO 42%      - MTX started 06/18/09 Community acquired pneumonia Chronic hypoxemic respiratory failure      - 4 liters oxygen  Chronic rhinitis Prostate cancer s/p external radiation and hormonal therapy      - Dr. Jethro Bolus Renal cell carcinoma Left eye tumor s/p enucleation age 73 Chronic systolic congestive heart failure                   - Dr. Olga Millers                    -  Echo 07/09 EF 50% Coronary artery disease Ventricular Tachycardia Second degree AV block, Mobitz Type II Mild Aortic regurgitation Hypertension Hyperlipidemia Anxiety Osteoporosis      - T12, L1, L2 compression fracture Obstructive sleep apnea      - PSG 01/08 AHI 11      - BPAP 9/5 cm H2O Right elbow bursitis Gout Thyroid cyst GERD  Past Surgical History: Reviewed history from 12/18/2008 and no changes required. CABG 02/01      - Dr. Sheliah Plane Left partial nephrectomy 04/02      - Dr. Jethro Bolus TURP Septoplasty Right tympanoplasty Left tympanic implant Pacemaker insertion 12/07 Kyphoplasty 04/09     - Dr. Colon Branch  Vital Signs:  Patient profile:   77 year old male Height:      62 inches (157.48 cm) Weight:      159 pounds (72.27 kg) BMI:     29.19 O2 Sat:      95 % on 2 L/min Temp:     97.4  degrees F (36.33 degrees C) oral Pulse rate:   75 / minute BP sitting:   110 / 60  (left arm) Cuff size:   regular  Vitals Entered By: Michel Bickers CMA (February 28, 2010 12:26 PM)  O2 Sat at Rest %:  95 O2 Flow:  2 L/min  Physical Exam  General:  on supplemental oxygen.   Nose:  clear nasal discharge.   Mouth:  no deformity or lesions Neck:  no JVD.   Lungs:  basilar rales, no wheezing or rhonchi Heart:  regular rate and rhythm, S1, S2 without murmurs, rubs, gallops, or clicks Extremities:  no edema Cervical Nodes:  no significant adenopathy   Impression & Recommendations:  Problem # 1:  POSTINFLAMMATORY PULMONARY FIBROSIS (ICD-515)  His recent chest xray and CT chest have not shown much change in his fibrosis.  I am not sure how much benefit he is getting from methotrexate.  Will continue to try to gradually wean down his methotrexate dose.  Explained that he has chronic fibrosis, and will always have some degree of breathing problems.  Problem # 2:  HYPOXEMIA (ICD-799.02) He is to continue on supplemental oxygen.  Problem # 3:  OBSTRUCTIVE SLEEP APNEA (ICD-327.23) His BPAP needs may have changed with his recent weight loss.  Will arrange for an autoBPAP titration, and then decide if he even needs BPAP anymore.  Problem # 4:  ANXIETY DISORDER (ICD-300.00)  Advised him to try using his xanax more often.  Medications Added to Medication List This Visit: 1)  Methotrexate 2.5 Mg Tabs (Methotrexate sodium) .... 2 pills per week for two weeks, then 1 pill per week  Complete Medication List: 1)  Methotrexate 2.5 Mg Tabs (Methotrexate sodium) .... 2 pills per week for two weeks, then 1 pill per week 2)  Folic Acid 1 Mg Tabs (Folic acid) .... One by mouth once daily 3)  Carvedilol 12.5 Mg Tabs (Carvedilol) .Marland Kitchen.. 1 tab by mouth two times a day 4)  Lipitor 80 Mg Tabs (Atorvastatin calcium) .Marland Kitchen.. 1 by mouth once daily 5)  Potassium Chloride Crys Cr 20 Meq Cr-tabs (Potassium chloride  crys cr) .... 2 tabs morning and 1 tablet in the afternoon 6)  Coq10 100 Mg Caps (Coenzyme q10) .Marland Kitchen.. 1 once daily 7)  Multivitamins Tabs (Multiple vitamin) .... Once daily 8)  Furosemide 40 Mg Tabs (Furosemide) .... Three tabs by mouth two times a day 9)  Calcium 600-d 600-400 Mg-unit Tabs (Calcium carbonate-vitamin  d) .... 2 by mouth daily 10)  Bipap  .... At bedtime 11)  Oxygen 4 L At Rest and 5 L Exertion  12)  Alprazolam 0.25 Mg Tabs (Alprazolam) .... One by mouth three times a day as needed 13)  Nasonex 50 Mcg/act Susp (Mometasone furoate) .... Two puffs each nostril daily as needed 14)  Proair Hfa 108 (90 Base) Mcg/act Aers (Albuterol sulfate) .... Two puffs up to four times per day as needed 15)  Nitrostat 0.4 Mg Subl (Nitroglycerin) .Marland Kitchen.. 1 tablet under tongue at onset of chest pain; you may repeat every 5 minutes for up to 3 doses. 16)  Aspirin 81 Mg Tbec (Aspirin) .... Take one tablet by mouth daily 17)  Imdur 60 Mg Xr24h-tab (Isosorbide mononitrate) .... Take one tablet by mouth daily  Other Orders: Est. Patient Level IV (43329) DME Referral (DME)  Patient Instructions: 1)  Methotrexate 2 pills per week for two weeks, then 1 pill per week 2)  Will set up test of BPAP pressure at home 3)  Follow up in 4 to 6 weeks Prescriptions: NASONEX 50 MCG/ACT  SUSP (MOMETASONE FUROATE) Two puffs each nostril daily as needed  #1 x 6   Entered and Authorized by:   Coralyn Helling MD   Signed by:   Coralyn Helling MD on 02/28/2010   Method used:   Electronically to        [facility] [address] [id number]* (retail)       [address]       [place], [place]  [address]       Ph: [phone]       Fax: [phone]   RxID:   [id number]

## 2010-10-08 NOTE — Assessment & Plan Note (Signed)
Summary: per check out/sf    Referring Provider:  Olga Millers Primary Provider:  Gershon Crane  CC:  sob.  History of Present Illness: Mr. Graumann is a pleasant gentleman who has a history of coronary artery disease status post coronary bypass and graft, ischemic cardiomyopathy, combined systolic/diastolic heart failure, and history of pacemaker placement.  He also has pulmonary fibrosis and vasculitis.  His most recent Myoview was performed in May of 2011.  At that time, the ejection fraction was 56%.  There was a prior inferior and apical infarct but no ischemia. An echocardiogram performed on March 13, 2008 showed an ejection fraction of 50%. There was mild aortic insufficiency. I last saw him in May 2011. Since then he continues to have dyspnea on exertion relieved with rest. This is a chronic issue and unchanged. There is no orthopnea or PND but there is chronic pedal edema controlled with Lasix. He occasionally feels chest tightness with anxiety but no other times.  Current Medications (verified): 1)  Methotrexate 2.5 Mg Tabs (Methotrexate Sodium) .... 3 Pills Per Week 2)  Folic Acid 1 Mg Tabs (Folic Acid) .... One By Mouth Once Daily 3)  Carvedilol 12.5 Mg  Tabs (Carvedilol) .Marland Kitchen.. 1 Tab By Mouth Two Times A Day 4)  Lipitor 80 Mg Tabs (Atorvastatin Calcium) .Marland Kitchen.. 1 By Mouth Once Daily 5)  Potassium Chloride Crys Cr 20 Meq Cr-Tabs (Potassium Chloride Crys Cr) .... 2 Tabs Morning and 1 Tablet in The Afternoon 6)  Coq10 100 Mg  Caps (Coenzyme Q10) .Marland Kitchen.. 1 Once Daily 7)  Multivitamins   Tabs (Multiple Vitamin) .... Once Daily 8)  Furosemide 40 Mg Tabs (Furosemide) .... Three Tabs By Mouth Two Times A Day 9)  Calcium 600-D 600-400 Mg-Unit Tabs (Calcium Carbonate-Vitamin D) .... 2 By Mouth Daily 10)  Bipap .... At Bedtime 11)  Oxygen 4 L At Rest and  5 L Exertion 12)  Alprazolam 0.25 Mg Tabs (Alprazolam) .... One By Mouth Three Times A Day As Needed 13)  Nasonex 50 Mcg/act  Susp (Mometasone  Furoate) .... Two Puffs Each Nostril Daily As Needed 14)  Proair Hfa 108 (90 Base) Mcg/act Aers (Albuterol Sulfate) .... Two Puffs Up To Four Times Per Day As Needed 15)  Nitrostat 0.4 Mg Subl (Nitroglycerin) .Marland Kitchen.. 1 Tablet Under Tongue At Onset of Chest Pain; You May Repeat Every 5 Minutes For Up To 3 Doses. 16)  Aspirin 81 Mg Tbec (Aspirin) .... Take One Tablet By Mouth Daily 17)  Imdur 60 Mg Xr24h-Tab (Isosorbide Mononitrate) .... Take One Tablet By Mouth Daily  Allergies: 1)  ! Penicillin 2)  ! * Zetia 3)  ! Levaquin 4)  ! Cipro  Past History:  Past Medical History: Reviewed history from 01/29/2010 and no changes required. Pauci-immune vasculitis with pulmonary fibrosis      - Dr. Coralyn Helling      - 03/02 P-ANCA > 1:640, Myeloperoxidase Ab 17.6, ANA negative, RPR negative, RF < 20      - 07/09 ANCA < 1:20      - PFT 07/09 FEV1 1.90 (81%), FVC 2.66 (71%), TLC 5.08 (87%), DLCO 42%      - MTX started 06/18/09 Community acquired pneumonia Chronic hypoxemic respiratory failure      - 4 liters oxygen  Chronic rhinitis Prostate cancer s/p external radiation and hormonal therapy      - Dr. 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 Olga Millers                    - Echo 07/09 EF 50% Coronary artery disease Ventricular Tachycardia Second degree AV block, Mobitz Type II Mild Aortic regurgitation Hypertension Hyperlipidemia Anxiety Osteoporosis      - T12, L1, L2 compression fracture Obstructive sleep apnea      - PSG 01/08 AHI 11      - BPAP 9/5 cm H2O Right elbow bursitis Gout Thyroid cyst GERD  Past Surgical History: Reviewed history from 12/18/2008 and no changes required. CABG 02/01      - Dr. Sheliah Plane Left partial nephrectomy 04/02      - Dr. Jethro Bolus TURP Septoplasty Right tympanoplasty Left tympanic implant Pacemaker insertion  12/07 Kyphoplasty 04/09     - Dr. Colon Branch  Social History: Reviewed history from 12/18/2008 and no changes required. Widowed.  Three children.  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  Occasional alcohol use.   Review of Systems       no fevers or chills, productive cough, hemoptysis, dysphasia, odynophagia, melena, hematochezia, dysuria, hematuria, rash, seizure activity, orthopnea, PND, pedal edema, claudication. Remaining systems are negative.   Vital Signs:  Patient profile:   77 year old male Height:      62 inches Weight:      155 pounds BMI:     28.45 Pulse rate:   81 / minute Resp:     12 per minute BP sitting:   94 / 49  (left arm)  Vitals Entered By: Kem Parkinson (April 17, 2010 11:08 AM)  Physical Exam  General:  Well-developed well-nourished in no acute distress.  Skin is warm and dry.  HEENT is normal.  Neck is supple. No thyromegaly.  Chest is clear to auscultation with normal expansion.  Cardiovascular exam is regular rate and rhythm.  Abdominal exam nontender or distended. No masses palpated. Extremities show no edema. neuro grossly intact    PPM Specifications Following MD:  Sherryl Manges, MD     Referring MD:  [facility] Vendor:  [employer]     PPM Model Number:  [id number]     PPM Serial Number:  [id number] PPM DOI:  08/11/2006     PPM Implanting MD:  Sherryl Manges, MD  Lead 1    Location: RA     DOI: 08/11/2006     Model #: [id number]     Serial #: [id number]     Status: active Lead 2    Location: RV     DOI: 08/11/2006     Model #: [id number]     Serial #: [id number]     Status: active   Indications:  MOBITZ II   PPM Follow Up Pacer Dependent:  No      Episodes Coumadin:  No  Parameters Mode:  MVP (R)     Lower Rate Limit:  75     Upper Rate Limit:  130 Paced AV Delay:  180     Sensed AV Delay:  150  Impression & Recommendations:  Problem # 1:  CAD, S/P CABG EF 45% (ICD-414.00) Patient will continue on aspirin, beta blocker and statin.  Recent Myoview shows no ischemia and preserved LV function. His updated medication list for this problem includes:    Carvedilol 12.5 Mg Tabs (Carvedilol) .Marland Kitchen... 1 tab by mouth two times a day    Nitrostat 0.4 Mg Subl (Nitroglycerin) .Marland Kitchen... 1 tablet under tongue at onset of chest  pain; you may repeat every 5 minutes for up to 3 doses.    Aspirin 81 Mg Tbec (Aspirin) .Marland Kitchen... Take one tablet by mouth daily    Imdur 60 Mg Xr24h-tab (Isosorbide mononitrate) .Marland Kitchen... Take one tablet by mouth daily  Problem # 2:  PACEMAKER, PERMANENT DDD MDT (ICD-V45.01) Management per EP.  Problem # 3:  POSTINFLAMMATORY PULMONARY FIBROSIS (ICD-515)  Problem # 4:  HYPERTENSION (ICD-401.9) Blood pressure controlled on present medications. Will continue. His updated medication list for this problem includes:    Carvedilol 12.5 Mg Tabs (Carvedilol) .Marland Kitchen... 1 tab by mouth two times a day    Furosemide 40 Mg Tabs (Furosemide) .Marland Kitchen... Three tabs by mouth two times a day    Aspirin 81 Mg Tbec (Aspirin) .Marland Kitchen... Take one tablet by mouth daily  Problem # 5:  HYPERLIPIDEMIA (ICD-272.4) Continue statin. His updated medication list for this problem includes:    Lipitor 80 Mg Tabs (Atorvastatin calcium) .Marland Kitchen... 1 by mouth once daily  Problem # 6:  ANXIETY DISORDER (ICD-300.00)  Problem # 7:  GERD (ICD-530.81)  Patient Instructions: 1)  Your physician recommends that you schedule a follow-up appointment in: 6 MONTHS

## 2010-10-10 NOTE — Assessment & Plan Note (Signed)
Summary: Acute NP office visit   Copy to:  Olga Millers Primary Provider/Referring Provider:  Gershon Crane  CC:  sore/scratchy throat w/ swollen glands, hoarseness, prod cough with off-white mucus, wheezing x2days - denies SOB, f/c/s, and tightness in chest.  History of Present Illness: 77  year old male with  known history of  pauci-immune vasculitis with pulmonary fibrosis, hypoxemia, and sleep apnea unable to tolerate  BIPAP.  October 02, 2010 --Presents for an acute office visit. Complains of sore/scratchy throat w/ swollen glands, hoarseness, prod cough with off-white mucus, wheezing x2days. OTC not helping. Denies chest pain,  orthopnea, hemoptysis, fever, n/v/d, edema, headache.   Medications Prior to Update: 1)  Methotrexate 2.5 Mg Tabs (Methotrexate Sodium) .... 3 Pills Per Week 2)  Folic Acid 1 Mg Tabs (Folic Acid) .... One By Mouth Once Daily 3)  Carvedilol 12.5 Mg  Tabs (Carvedilol) .Marland Kitchen.. 1 Tab By Mouth Two Times A Day 4)  Lipitor 80 Mg Tabs (Atorvastatin Calcium) .Marland Kitchen.. 1 By Mouth Once Daily 5)  Potassium Chloride Crys Cr 20 Meq Cr-Tabs (Potassium Chloride Crys Cr) .... 2 Tabs Morning and 1 Tablet in The Afternoon 6)  Coq10 100 Mg  Caps (Coenzyme Q10) .Marland Kitchen.. 1 Once Daily 7)  Multivitamins   Tabs (Multiple Vitamin) .... Once Daily 8)  Furosemide 40 Mg Tabs (Furosemide) .... Three Tabs By Mouth Two Times A Day 9)  Calcium 600-D 600-400 Mg-Unit Tabs (Calcium Carbonate-Vitamin D) .... 2 By Mouth Daily 10)  Alprazolam 0.25 Mg Tabs (Alprazolam) .... One By Mouth Three Times A Day As Needed 11)  Nasonex 50 Mcg/act  Susp (Mometasone Furoate) .... Two Puffs Each Nostril Daily As Needed 12)  Proair Hfa 108 (90 Base) Mcg/act Aers (Albuterol Sulfate) .... Two Puffs Up To Four Times Per Day As Needed 13)  Nitrostat 0.4 Mg Subl (Nitroglycerin) .Marland Kitchen.. 1 Tablet Under Tongue At Onset of Chest Pain; You May Repeat Every 5 Minutes For Up To 3 Doses. 14)  Aspirin 81 Mg Tbec (Aspirin) .... Take One  Tablet By Mouth Daily 15)  Imdur 60 Mg Xr24h-Tab (Isosorbide Mononitrate) .... Take One Tablet By Mouth Daily 16)  Bipap .... At Bedtime 17)  Oxygen 4 L At Rest and  5 L Exertion  Current Medications (verified): 1)  Methotrexate 2.5 Mg Tabs (Methotrexate Sodium) .... 3 Pills Per Week 2)  Folic Acid 1 Mg Tabs (Folic Acid) .... One By Mouth Once Daily 3)  Carvedilol 12.5 Mg  Tabs (Carvedilol) .Marland Kitchen.. 1 Tab By Mouth Two Times A Day 4)  Lipitor 80 Mg Tabs (Atorvastatin Calcium) .Marland Kitchen.. 1 By Mouth Once Daily 5)  Potassium Chloride Crys Cr 20 Meq Cr-Tabs (Potassium Chloride Crys Cr) .... 2 Tabs Morning and 1 Tablet in The Afternoon 6)  Coq10 100 Mg  Caps (Coenzyme Q10) .Marland Kitchen.. 1 Once Daily 7)  Multivitamins   Tabs (Multiple Vitamin) .... Once Daily 8)  Furosemide 40 Mg Tabs (Furosemide) .... Three Tabs By Mouth Two Times A Day 9)  Calcium 600-D 600-400 Mg-Unit Tabs (Calcium Carbonate-Vitamin D) .... 2 By Mouth Daily 10)  Bipap .... At Bedtime 11)  Oxygen 4 L At Rest and  5 L Exertion 12)  Alprazolam 0.25 Mg Tabs (Alprazolam) .... One By Mouth Three Times A Day As Needed 13)  Nasonex 50 Mcg/act  Susp (Mometasone Furoate) .... Two Puffs Each Nostril Daily As Needed 14)  Proair Hfa 108 (90 Base) Mcg/act Aers (Albuterol Sulfate) .... Two Puffs Up To Four Times Per Day As Needed  15)  Nitrostat 0.4 Mg Subl (Nitroglycerin) .Marland Kitchen.. 1 Tablet Under Tongue At Onset of Chest Pain; You May Repeat Every 5 Minutes For Up To 3 Doses. 16)  Aspirin 81 Mg Tbec (Aspirin) .... Take One Tablet By Mouth Daily 17)  Imdur 60 Mg Xr24h-Tab (Isosorbide Mononitrate) .... Take One Tablet By Mouth Daily 18)  Mucinex Dm 30-600 Mg Xr12h-Tab (Dextromethorphan-Guaifenesin) .... Take 1-2 Tablets Every 12 Hours As Needed  Allergies (verified): 1)  ! Penicillin 2)  ! * Zetia 3)  ! Levaquin 4)  ! Cipro  Past History:  Past Medical History: Last updated: 09/12/2010 Pauci-immune vasculitis with pulmonary fibrosis      - Dr. Coralyn Helling       - 03/02 P-ANCA > 1:640, Myeloperoxidase Ab 17.6, ANA negative, RPR negative, RF < 20      - 07/09 ANCA < 1:20      - PFT 07/09 FEV1 1.90 (81%), FVC 2.66 (71%), TLC 5.08 (87%), DLCO 42%      - MTX started 06/18/09 Community acquired pneumonia Chronic hypoxemic respiratory failure      - 4 liters oxygen  Chronic rhinitis Prostate cancer s/p external radiation and hormonal therapy      - Dr. Jethro Bolus Renal cell carcinoma Left eye tumor s/p enucleation age 31 Chronic systolic congestive heart failure                   - Dr. Olga Millers                    - Echo 07/09 EF 50% Coronary artery disease Ventricular Tachycardia Second degree AV block, Mobitz Type II Mild Aortic regurgitation Hypertension Hyperlipidemia Anxiety Osteoporosis      - T12, L1, L2 compression fracture Obstructive sleep apnea      - PSG 01/08 AHI 11      - unable to tolerate BPAP due to ear problems Right elbow bursitis Gout Thyroid cyst GERD  Past Surgical History: Last updated: 12/18/2008 CABG 02/01      - Dr. Sheliah Plane Left partial nephrectomy 04/02      - Dr. Jethro Bolus TURP Septoplasty Right tympanoplasty Left tympanic implant Pacemaker insertion 12/07 Kyphoplasty 04/09     - Dr. Colon Branch  Family History: Last updated: 12/18/2008 Father - CAD, TB Mother - Colon cancer  Social History: Last updated: 12/18/2008 Widowed.  Three children.  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  Occasional alcohol use.   Risk Factors: Smoking Status: never (05/06/2010) Packs/Day: <1.0 (03/28/2010)  Review of Systems      See HPI  Vital Signs:  Patient profile:   77 year old male Height:      62 inches Weight:      154.25 pounds BMI:     28.31 O2 Sat:      98 % on 4 L/min cont Temp:     97.2 degrees F oral Pulse rate:   84 / minute BP sitting:   96 / 58  (left arm) Cuff size:   regular  Vitals Entered By: Boone Master CNA/MA (October 02, 2010 11:19  AM)  O2 Flow:  4 L/min cont CC: sore/scratchy throat w/ swollen glands, hoarseness, prod cough with off-white mucus, wheezing x2days - denies SOB, f/c/s, tightness in chest Is Patient Diabetic? No Comments Medications reviewed with patient Daytime contact number verified with patient. Boone Master CNA/MA  October 02, 2010 11:17 AM    Physical Exam  Additional Exam:  GEN:  A/Ox3; chronically ill appearing.  HEENT:  [place]/AT, , EACs-clear, TMs-wnl, NOSE-clear, THROAT-mild redness, no exudate  NECK:  Supple w/ fair ROM; no JVD; normal carotid impulses w/o bruits; no thyromegaly or nodules palpated; right cervical adenopathy. RESP  Coarse BS w/ few rhonchi , no wheezing  CARD:  RRR, no m/r/g   GI:   Soft & nt; nml bowel sounds; no organomegaly or masses detected. [place]: Warm bil,  no calf tenderness clubbing, pulses intact , trace edema.  Neuro: intact   Impression & Recommendations:  Problem # 1:  UPPER RESPIRATORY INFECTION, ACUTE, WITH BRONCHITIS (ICD-465.9)  Albuterol neb in office  Omnicef 300mg  two times a day for 7days  Mucinex DM two times a day as needed cough/congestion Increase fluids and rest  Please contact office for sooner follow up if symptoms do not improve or worsen  follow up .Dr. Craige Cotta in 2 months and as needed  His updated medication list for this problem includes:    Aspirin 81 Mg Tbec (Aspirin) .Marland Kitchen... Take one tablet by mouth daily    Mucinex Dm 30-600 Mg Xr12h-tab (Dextromethorphan-guaifenesin) .Marland Kitchen... Take 1-2 tablets every 12 hours as needed  Orders: Est. Patient Level III ([address])  Medications Added to Medication List This Visit: 1)  Mucinex Dm 30-600 Mg Xr12h-tab (Dextromethorphan-guaifenesin) .... Take 1-2 tablets every 12 hours as needed 2)  Cefdinir 300 Mg Caps (Cefdinir) .Marland Kitchen.. 1 by mouth two times a day  Patient Instructions: 1)  Omnicef 300mg  two times a day for 7days  2)  Mucinex DM two times a day as needed cough/congestion 3)  Increase fluids and  rest  4)  Please contact office for sooner follow up if symptoms do not improve or worsen  5)  follow up .Dr. Craige Cotta in 2 months and as needed  Prescriptions: CEFDINIR 300 MG CAPS (CEFDINIR) 1 by mouth two times a day  #14 x 0   Entered and Authorized by:   Rubye Oaks NP   Signed by:   Tammy Parrett NP on 10/02/2010   Method used:   Electronically to        [id number] (retail)       [address]       [place], [place]  [address]       Ph: [phone]       Fax: [phone]   RxID:   [id number]   Appended Document: Acute NP office visit     Clinical Lists Changes  Orders: Added new Service order of Nebulizer Tx (27253) - Signed Added new Service order of Albuterol Sulfate Sol 1mg  unit dose (G6440) - Signed       Medication Administration  Medication # 1:    Medication: Albuterol Sulfate Sol 1mg  unit dose    Diagnosis: UPPER RESPIRATORY INFECTION, ACUTE, WITH BRONCHITIS (ICD-465.9)    Dose: 1 vial    Route: inhaled    Exp Date: 09/2011    Lot #: [id number]    Mfr: NEPHRON    Patient tolerated medication without complications    Given by: Carver Fila (October 02, 2010 11:43 AM)  Orders Added: 1)  Nebulizer Tx [34742] 2)  Albuterol Sulfate Sol 1mg  unit dose [V9563]

## 2010-10-10 NOTE — Assessment & Plan Note (Signed)
Summary: per reminder letter/mhh   Visit Type:  Follow-up Copy to:  Olga Millers Primary Provider/Referring Provider:  Gershon Crane  CC:  3 mo follow-up...patient says there is no change in his breathing...no using bpap due to air roaring through his ear.  History of Present Illness: 77  year old male with  known history of  pauci-immune vasculitis with pulmonary fibrosis, hypoxemia, and sleep apnea unable to tolerate  BIPAP.  He has been doing about the same.  He does not have much cough or sputum.  His activity level has been the same.  He denies fever, wheezing, hemoptysis.  He has some ankle swelling, but no worse than usual.  He is not able to use his BPAP due to his ear problems.   He is being evaluated for cataract surgery at [facility].   Current Medications (verified): 1)  Methotrexate 2.5 Mg Tabs (Methotrexate Sodium) .... 3 Pills Per Week 2)  Folic Acid 1 Mg Tabs (Folic Acid) .... One By Mouth Once Daily 3)  Carvedilol 12.5 Mg  Tabs (Carvedilol) .Marland Kitchen.. 1 Tab By Mouth Two Times A Day 4)  Lipitor 80 Mg Tabs (Atorvastatin Calcium) .Marland Kitchen.. 1 By Mouth Once Daily 5)  Potassium Chloride Crys Cr 20 Meq Cr-Tabs (Potassium Chloride Crys Cr) .... 2 Tabs Morning and 1 Tablet in The Afternoon 6)  Coq10 100 Mg  Caps (Coenzyme Q10) .Marland Kitchen.. 1 Once Daily 7)  Multivitamins   Tabs (Multiple Vitamin) .... Once Daily 8)  Furosemide 40 Mg Tabs (Furosemide) .... Three Tabs By Mouth Two Times A Day 9)  Calcium 600-D 600-400 Mg-Unit Tabs (Calcium Carbonate-Vitamin D) .... 2 By Mouth Daily 10)  Bipap .... At Bedtime 11)  Oxygen 4 L At Rest and  5 L Exertion 12)  Alprazolam 0.25 Mg Tabs (Alprazolam) .... One By Mouth Three Times A Day As Needed 13)  Nasonex 50 Mcg/act  Susp (Mometasone Furoate) .... Two Puffs Each Nostril Daily As Needed 14)  Proair Hfa 108 (90 Base) Mcg/act Aers (Albuterol Sulfate) .... Two Puffs Up To Four Times Per Day As Needed 15)  Nitrostat 0.4 Mg Subl (Nitroglycerin) .Marland Kitchen.. 1 Tablet Under  Tongue At Onset of Chest Pain; You May Repeat Every 5 Minutes For Up To 3 Doses. 16)  Aspirin 81 Mg Tbec (Aspirin) .... Take One Tablet By Mouth Daily 17)  Imdur 60 Mg Xr24h-Tab (Isosorbide Mononitrate) .... Take One Tablet By Mouth Daily  Allergies (verified): 1)  ! Penicillin 2)  ! * Zetia 3)  ! Levaquin 4)  ! Cipro  Past History:  Past Medical History: Pauci-immune vasculitis with pulmonary fibrosis      - Dr. Coralyn Helling      - 03/02 P-ANCA > 1:640, Myeloperoxidase Ab 17.6, ANA negative, RPR negative, RF < 20      - 07/09 ANCA < 1:20      - PFT 07/09 FEV1 1.90 (81%), FVC 2.66 (71%), TLC 5.08 (87%), DLCO 42%      - MTX started 06/18/09 Community acquired pneumonia Chronic hypoxemic respiratory failure      - 4 liters oxygen  Chronic rhinitis Prostate cancer s/p external radiation and hormonal therapy      - Dr. Jethro Bolus Renal cell carcinoma Left eye tumor s/p enucleation age 60 Chronic systolic congestive heart failure                   - Dr. Olga Millers                    -  Echo 07/09 EF 50% Coronary artery disease Ventricular Tachycardia Second degree AV block, Mobitz Type II Mild Aortic regurgitation Hypertension Hyperlipidemia Anxiety Osteoporosis      - T12, L1, L2 compression fracture Obstructive sleep apnea      - PSG 01/08 AHI 11      - unable to tolerate BPAP due to ear problems Right elbow bursitis Gout Thyroid cyst GERD  Past Surgical History: Reviewed history from 12/18/2008 and no changes required. CABG 02/01      - Dr. Sheliah Plane Left partial nephrectomy 04/02      - Dr. Jethro Bolus TURP Septoplasty Right tympanoplasty Left tympanic implant Pacemaker insertion 12/07 Kyphoplasty 04/09     - Dr. Colon Branch  Vital Signs:  Patient profile:   77 year old male Height:      62 inches (157.48 cm) Weight:      156 pounds (70.91 kg) BMI:     28.64 O2 Sat:      97 % on 4 L/min Temp:     97.8 degrees F (36.56 degrees  C) oral Pulse rate:   86 / minute BP sitting:   116 / 78  (right arm) Cuff size:   regular  Vitals Entered By: Michel Bickers CMA (September 12, 2010 1:32 PM)  O2 Sat at Rest %:  97 O2 Flow:  4 L/min CC: 3 mo follow-up...patient says there is no change in his breathing...no using bpap due to air roaring through his ear Comments Medications reviewed with patient Michel Bickers CMA  September 12, 2010 1:43 PM   Physical Exam  General:  on supplemental oxygen.   Nose:  clear nasal discharge.   Mouth:  no deformity or lesions Neck:  no JVD.   Lungs:  basilar crackles, no wheeze, no dullness Heart:  regular rate and rhythm, S1, S2 without murmurs, rubs, gallops, or clicks Extremities:  no edema Neurologic:  normal CN II-XII and strength normal.   Cervical Nodes:  no significant adenopathy Psych:  alert and cooperative; normal mood and affect; normal attention span and concentration   Impression & Recommendations:  Problem # 1:  POSTINFLAMMATORY PULMONARY FIBROSIS (ICD-515) He has been stable.  Will check his labs today, and continue current dose of mtx.  Problem # 2:  CHRONIC RESPIRATORY FAILURE (ICD-518.83)  Will see if he can get more portable oxygen set up.  Problem # 3:  OBSTRUCTIVE SLEEP APNEA (ICD-327.23)  He has not been able to tolerate BPAP.  Problem # 4:  PRE-OPERATIVE RESPIRATORY EXAMINATION (ICD-V72.82) He is being evaluated for cataract surgery.  His activity level has become extremely limited as a result of his visual problems.  He is concerned that if he doesn't have something done soon he will become totally blind.  I have explained to him that he is at increase risk of peri-operative pulmonary complications, but these risks are not prohibitive.  He will need to have close monitoring of his oxygenation during the procedure.  Complete Medication List: 1)  Methotrexate 2.5 Mg Tabs (Methotrexate sodium) .... 3 pills per week 2)  Folic Acid 1 Mg Tabs (Folic acid) .... One  by mouth once daily 3)  Carvedilol 12.5 Mg Tabs (Carvedilol) .Marland Kitchen.. 1 tab by mouth two times a day 4)  Lipitor 80 Mg Tabs (Atorvastatin calcium) .Marland Kitchen.. 1 by mouth once daily 5)  Potassium Chloride Crys Cr 20 Meq Cr-tabs (Potassium chloride crys cr) .... 2 tabs morning and 1 tablet in the afternoon 6)  Coq10  100 Mg Caps (Coenzyme q10) .Marland Kitchen.. 1 once daily 7)  Multivitamins Tabs (Multiple vitamin) .... Once daily 8)  Furosemide 40 Mg Tabs (Furosemide) .... Three tabs by mouth two times a day 9)  Calcium 600-d 600-400 Mg-unit Tabs (Calcium carbonate-vitamin d) .... 2 by mouth daily 10)  Bipap  .... At bedtime 11)  Oxygen 4 L At Rest and 5 L Exertion  12)  Alprazolam 0.25 Mg Tabs (Alprazolam) .... One by mouth three times a day as needed 13)  Nasonex 50 Mcg/act Susp (Mometasone furoate) .... Two puffs each nostril daily as needed 14)  Proair Hfa 108 (90 Base) Mcg/act Aers (Albuterol sulfate) .... Two puffs up to four times per day as needed 15)  Nitrostat 0.4 Mg Subl (Nitroglycerin) .Marland Kitchen.. 1 tablet under tongue at onset of chest pain; you may repeat every 5 minutes for up to 3 doses. 16)  Aspirin 81 Mg Tbec (Aspirin) .... Take one tablet by mouth daily 17)  Imdur 60 Mg Xr24h-tab (Isosorbide mononitrate) .... Take one tablet by mouth daily  Other Orders: Est. Patient Level III (16109) DME Referral (DME) TLB-BMP (Basic Metabolic Panel-BMET) (80048-METABOL) TLB-CBC Platelet - w/Differential (85025-CBCD) TLB-Hepatic/Liver Function Pnl (80076-HEPATIC)  Patient Instructions: 1)  Lab tests today 2)  Follow up in 3 months

## 2010-10-10 NOTE — Progress Notes (Signed)
Summary: portable 02 order  Phone Note Other Incoming   Caller: [facility]-[phone] Summary of Call: An order was [facility]ed to [facility] to get pt portable 02.  [facility] calling saying patient already has portable 02 and is calling to clarify order. Initial call taken by: Lehman Prom,  September 12, 2010 3:02 PM  Follow-up for Phone Call        spoke with Lafonda Mosses with [facility]. She states pt is currently on o2 therapy, I advised we are aware of same but requesting a more portable tank for pt. She advised they do have same but don't last as long and will handle same. Zackery Barefoot CMA  September 12, 2010 4:48 PM

## 2010-10-14 ENCOUNTER — Ambulatory Visit (INDEPENDENT_AMBULATORY_CARE_PROVIDER_SITE_OTHER): Payer: Medicare Other | Admitting: Cardiology

## 2010-10-14 ENCOUNTER — Encounter: Payer: Self-pay | Admitting: Cardiology

## 2010-10-14 DIAGNOSIS — E7800 Pure hypercholesterolemia, unspecified: Secondary | ICD-10-CM

## 2010-10-14 DIAGNOSIS — I2510 Atherosclerotic heart disease of native coronary artery without angina pectoris: Secondary | ICD-10-CM

## 2010-10-14 DIAGNOSIS — Z01810 Encounter for preprocedural cardiovascular examination: Secondary | ICD-10-CM

## 2010-10-20 ENCOUNTER — Encounter (INDEPENDENT_AMBULATORY_CARE_PROVIDER_SITE_OTHER): Payer: Self-pay | Admitting: *Deleted

## 2010-10-24 NOTE — Assessment & Plan Note (Signed)
Summary: f48m/dm appt confirm w/pt=mj  Medications Added FUROSEMIDE 40 MG TABS (FUROSEMIDE) two tabs by mouth two times a day      Allergies Added:   Visit Type:  67m follow up Referring Provider:  Olga Millers Primary Provider:  Gershon Crane  CC:  shortness of breath and sometimes has pain in jaw.  History of Present Illness: [name] is a pleasant gentleman who has a history of coronary artery disease status post coronary bypass and graft, ischemic cardiomyopathy, combined systolic/diastolic heart failure, and history of pacemaker placement.  He also has pulmonary fibrosis and vasculitis.  His most recent Myoview was performed in May of 2011.  At that time, the ejection fraction was 56%.  There was a prior inferior and apical infarct but no ischemia. An echocardiogram performed on March 13, 2008 showed an ejection fraction of 50%. There was mild aortic insufficiency. I last saw him in August of 2011. Since then he continues to have dyspnea on exertion relieved with rest. He attributes this to his pulmonary disease. There is no orthopnea, PND, palpitations, syncope or chest pain. His pedal edema is controlled with diuretics.  Current Medications (verified): 1)  Methotrexate 2.5 Mg Tabs (Methotrexate Sodium) .... 3 Pills Per Week 2)  Folic Acid 1 Mg Tabs (Folic Acid) .... One By Mouth Once Daily 3)  Carvedilol 12.5 Mg  Tabs (Carvedilol) .Marland Kitchen.. 1 Tab By Mouth Two Times A Day 4)  Lipitor 80 Mg Tabs (Atorvastatin Calcium) .Marland Kitchen.. 1 By Mouth Once Daily 5)  Potassium Chloride Crys Cr 20 Meq Cr-Tabs (Potassium Chloride Crys Cr) .... 2 Tabs Morning and 1 Tablet in The Afternoon 6)  Coq10 100 Mg  Caps (Coenzyme Q10) .Marland Kitchen.. 1 Once Daily 7)  Multivitamins   Tabs (Multiple Vitamin) .... Once Daily 8)  Furosemide 40 Mg Tabs (Furosemide) .... Two Tabs By Mouth Two Times A Day 9)  Calcium 600-D 600-400 Mg-Unit Tabs (Calcium Carbonate-Vitamin D) .... 2 By Mouth Daily 10)  Alprazolam 0.25 Mg Tabs (Alprazolam)  .... One By Mouth Three Times A Day As Needed 11)  Nasonex 50 Mcg/act  Susp (Mometasone Furoate) .... Two Puffs Each Nostril Daily As Needed 12)  Proair Hfa 108 (90 Base) Mcg/act Aers (Albuterol Sulfate) .... Two Puffs Up To Four Times Per Day As Needed 13)  Nitrostat 0.4 Mg Subl (Nitroglycerin) .Marland Kitchen.. 1 Tablet Under Tongue At Onset of Chest Pain; You May Repeat Every 5 Minutes For Up To 3 Doses. 14)  Aspirin 81 Mg Tbec (Aspirin) .... Take One Tablet By Mouth Daily 15)  Imdur 60 Mg Xr24h-Tab (Isosorbide Mononitrate) .... Take One Tablet By Mouth Daily 16)  Mucinex Dm 30-600 Mg Xr12h-Tab (Dextromethorphan-Guaifenesin) .... Take 1-2 Tablets Every 12 Hours As Needed 17)  Bipap .... At Bedtime 18)  Oxygen 4 L At Rest and  5 L Exertion  Allergies (verified): 1)  ! Penicillin 2)  ! * Zetia 3)  ! Levaquin 4)  ! Cipro  Past History:  Past Medical History: Reviewed history from 09/12/2010 and no changes required. Pauci-immune vasculitis with pulmonary fibrosis      - Dr. Coralyn Helling      - 03/02 P-ANCA > 1:640, Myeloperoxidase Ab 17.6, ANA negative, RPR negative, RF < 20      - 07/09 ANCA < 1:20      - PFT 07/09 FEV1 1.90 (81%), FVC 2.66 (71%), TLC 5.08 (87%), DLCO 42%      - MTX started 06/18/09 Community acquired pneumonia Chronic hypoxemic respiratory failure      -  4 liters oxygen  Chronic rhinitis Prostate cancer s/p external radiation and hormonal therapy      - Dr. Jethro Bolus Renal cell carcinoma Left eye tumor s/p enucleation age 46 Chronic systolic congestive heart failure                   - Dr. Olga Millers                    - Echo 07/09 EF 50% Coronary artery disease Ventricular Tachycardia Second degree AV block, Mobitz Type II Mild Aortic regurgitation Hypertension Hyperlipidemia Anxiety Osteoporosis      - T12, L1, L2 compression fracture Obstructive sleep apnea      - PSG 01/08 AHI 11      - unable to tolerate BPAP due to ear problems Right elbow  bursitis Gout Thyroid cyst GERD  Past Surgical History: Reviewed history from 12/18/2008 and no changes required. CABG 02/01      - Dr. Sheliah Plane Left partial nephrectomy 04/02      - Dr. Jethro Bolus TURP Septoplasty Right tympanoplasty Left tympanic implant Pacemaker insertion 12/07 Kyphoplasty 04/09     - Dr. Colon Branch  Social History: Reviewed history from 12/18/2008 and no changes required. Widowed.  Three children.  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  Occasional alcohol use.   Review of Systems       Problems with worsening vision secondary to cataracts but no fevers or chills, productive cough, hemoptysis, dysphasia, odynophagia, melena, hematochezia, dysuria, hematuria, rash, seizure activity, orthopnea, PND,  claudication. Remaining systems are negative.   Vital Signs:  Patient profile:   77 year old male Height:      62 inches Weight:      153.50 pounds BMI:     28.18 Pulse rate:   78 / minute BP sitting:   106 / 52  (left arm) Cuff size:   regular  Vitals Entered By: Caralee Ates CMA (October 14, 2010 1:54 PM)  Physical Exam  General:  Well-developed well-nourished in no acute distress.  Skin is warm and dry.  HEENT is significant for loss of left eye. Neck is supple. No thyromegaly.  Chest is significant for diffuse crackles.  Cardiovascular exam is regular rate and rhythm.  Abdominal exam nontender or distended. No masses palpated. Extremities show trace edema. neuro grossly intact    EKG  Procedure date:  10/14/2010  Findings:      Atrial paced rhythm, left anterior fasicular block, right bundle branch block, cannot rule out prior septal infarct. Left ventricular hypertrophy.  PPM Specifications Following MD:  Sherryl Manges, MD     Referring MD:  Cecille Po Vendor:  [employer]     PPM Model Number:  [id number]     PPM Serial Number:  [id number] PPM DOI:  08/11/2006     PPM Implanting MD:  Sherryl Manges, MD  Lead 1     Location: [facility]     DOI: 08/11/2006     Model #: [id number]     Serial #: [id number]     Status: active Lead 2    Location: [facility]     DOI: 08/11/2006     Model #: [id number]     Serial #: [id number]     Status: active   Indications:  MOBITZ II   PPM Follow Up Pacer Dependent:  No      Episodes Coumadin:  No  Parameters Mode:  MVP (R)  Lower Rate Limit:  75     Upper Rate Limit:  130 Paced AV Delay:  180     Sensed AV Delay:  150  Impression & Recommendations:  Problem # 1:  PREOPERATIVE EXAMINATION (ICD-V72.84) Patient is being considered for cataract surgery. There is no contraindication from a cardiac standpoint and he may proceed without further evaluation.  Problem # 2:  COMBINED HEART FAILURE, CHRONIC (ICD-428.42) Euvolemic on examination. Recent BNP normal. Continue present medications. His updated medication list for this problem includes:    Carvedilol 12.5 Mg Tabs (Carvedilol) .Marland Kitchen... 1 tab by mouth two times a day    Furosemide 40 Mg Tabs (Furosemide) .Marland Kitchen..Marland Kitchen Two tabs by mouth two times a day    Nitrostat 0.4 Mg Subl (Nitroglycerin) .Marland Kitchen... 1 tablet under tongue at onset of chest pain; you may repeat every 5 minutes for up to 3 doses.    Aspirin 81 Mg Tbec (Aspirin) .Marland Kitchen... Take one tablet by mouth daily    Imdur 60 Mg Xr24h-tab (Isosorbide mononitrate) .Marland Kitchen... Take one tablet by mouth daily  Problem # 3:  CAD, S/P CABG EF 45% (ICD-414.00) Continue aspirin, beta blocker and statin. His updated medication list for this problem includes:    Carvedilol 12.5 Mg Tabs (Carvedilol) .Marland Kitchen... 1 tab by mouth two times a day    Nitrostat 0.4 Mg Subl (Nitroglycerin) .Marland Kitchen... 1 tablet under tongue at onset of chest pain; you may repeat every 5 minutes for up to 3 doses.    Aspirin 81 Mg Tbec (Aspirin) .Marland Kitchen... Take one tablet by mouth daily    Imdur 60 Mg Xr24h-tab (Isosorbide mononitrate) .Marland Kitchen... Take one tablet by mouth daily  Problem # 4:  PACEMAKER, PERMANENT DDD MDT (ICD-V45.01) Management her  electrophysiology.  Problem # 5:  POSTINFLAMMATORY PULMONARY FIBROSIS (ICD-515) Management per pulmonary.  Problem # 6:  HYPERTENSION (ICD-401.9) Blood pressure controlled on present medications. Will continue. His updated medication list for this problem includes:    Carvedilol 12.5 Mg Tabs (Carvedilol) .Marland Kitchen... 1 tab by mouth two times a day    Furosemide 40 Mg Tabs (Furosemide) .Marland Kitchen..Marland Kitchen Two tabs by mouth two times a day    Aspirin 81 Mg Tbec (Aspirin) .Marland Kitchen... Take one tablet by mouth daily  Problem # 7:  HYPERLIPIDEMIA (ICD-272.4) Continue statin. His updated medication list for this problem includes:    Lipitor 80 Mg Tabs (Atorvastatin calcium) .Marland Kitchen... 1 by mouth once daily  Patient Instructions: 1)  Your physician wants you to follow-up in:ONE YEAR  You will receive a reminder letter in the mail two months in advance. If you don't receive a letter, please call our office to schedule the follow-up appointment.

## 2010-10-25 LAB — PACEMAKER DEVICE OBSERVATION

## 2010-10-28 ENCOUNTER — Telehealth (INDEPENDENT_AMBULATORY_CARE_PROVIDER_SITE_OTHER): Payer: Self-pay | Admitting: *Deleted

## 2010-10-30 NOTE — Cardiovascular Report (Signed)
Summary: Office Visit Remote   Office Visit Remote   Imported By: Roderic Ovens 10/22/2010 14:33:12  _____________________________________________________________________  External Attachment:    Type:   Image     Comment:   External Document

## 2010-10-30 NOTE — Letter (Signed)
Summary: Remote Device Check  [facility] [employer], Main Office  [address]   [place], [place] [address]   Phone: [phone]  Fax: [phone]     October 20, 2010 MRN: [id number]   [name] [address] [place], [place]  [address]   Dear Mr. Shealy,   Your remote transmission was recieved and reviewed by your physician.  All diagnostics were within normal limits for you.  __X___Your next transmission is scheduled for:  12-19-2010.  Please transmit at any time this day.  If you have a wireless device your transmission will be sent automatically.   Sincerely,  Vella Kohler

## 2010-11-05 NOTE — Progress Notes (Signed)
   Cardiac faxed to [facility] @ [phone] [facility]  October 28, 2010 11:42 AM

## 2010-11-26 LAB — POCT I-STAT 3, VENOUS BLOOD GAS (G3P V)
Acid-Base Excess: 5 mmol/L — ABNORMAL HIGH (ref 0.0–2.0)
Bicarbonate: 29.8 mEq/L — ABNORMAL HIGH (ref 20.0–24.0)
O2 Saturation: 67 %
TCO2: 31 mmol/L (ref 0–100)
pCO2, Ven: 41.5 mmHg — ABNORMAL LOW (ref 45.0–50.0)
pH, Ven: 7.465 — ABNORMAL HIGH (ref 7.250–7.300)
pO2, Ven: 33 mmHg (ref 30.0–45.0)

## 2010-11-26 LAB — POCT B-TYPE NATRIURETIC PEPTIDE (BNP): B Natriuretic Peptide, POC: 54.5 pg/mL (ref 0–100)

## 2010-11-26 LAB — DIFFERENTIAL
Basophils Absolute: 0.1 K/uL (ref 0.0–0.1)
Basophils Relative: 1 % (ref 0–1)
Eosinophils Absolute: 0.5 10*3/uL (ref 0.0–0.7)
Eosinophils Relative: 7 % — ABNORMAL HIGH (ref 0–5)
Lymphocytes Relative: 9 % — ABNORMAL LOW (ref 12–46)
Lymphs Abs: 0.6 10*3/uL — ABNORMAL LOW (ref 0.7–4.0)
Monocytes Absolute: 0.7 K/uL (ref 0.1–1.0)
Monocytes Relative: 11 % (ref 3–12)
Neutro Abs: 5 K/uL (ref 1.7–7.7)
Neutrophils Relative %: 73 % (ref 43–77)

## 2010-11-26 LAB — CBC
HCT: 38.3 % — ABNORMAL LOW (ref 39.0–52.0)
Hemoglobin: 12.7 g/dL — ABNORMAL LOW (ref 13.0–17.0)
MCHC: 33.2 g/dL (ref 30.0–36.0)
MCV: 94 fL (ref 78.0–100.0)
MCV: 95 fL (ref 78.0–100.0)
Platelets: 320 K/uL (ref 150–400)
Platelets: 278 10*3/uL (ref 150–400)
RBC: 4.08 MIL/uL — ABNORMAL LOW (ref 4.22–5.81)
RDW: 13.9 % (ref 11.5–15.5)
WBC: 6.9 10*3/uL (ref 4.0–10.5)
WBC: 4.8 10*3/uL (ref 4.0–10.5)

## 2010-11-26 LAB — BASIC METABOLIC PANEL
CO2: 29 mEq/L (ref 19–32)
Calcium: 9.7 mg/dL (ref 8.4–10.5)
Chloride: 103 mEq/L (ref 96–112)
Creatinine, Ser: 1.3 mg/dL (ref 0.4–1.5)
GFR calc Af Amer: >60 mL/min (ref >60)
Glucose, Bld: 217 mg/dL — ABNORMAL HIGH (ref 70–99)
Potassium: 3.5 mEq/L (ref 3.5–5.1)
Sodium: 143 mEq/L (ref 135–145)
Sodium: 137 mEq/L (ref 135–145)

## 2010-11-26 LAB — POCT CARDIAC MARKERS
CKMB, poc: 1.6 ng/mL (ref 1.0–8.0)
CKMB, poc: 1.6 ng/mL (ref 1.0–8.0)
Myoglobin, poc: 132 ng/mL (ref 12–200)
Myoglobin, poc: 145 ng/mL (ref 12–200)
Troponin i, poc: <0.05 ng/mL (ref 0.00–0.09)
Troponin i, poc: <0.05 ng/mL (ref 0.00–0.09)

## 2010-11-26 LAB — APTT: aPTT: 31 s (ref 24–37)

## 2010-11-26 LAB — CARDIAC PANEL(CRET KIN+CKTOT+MB+TROPI)
CK, MB: 1.6 ng/mL (ref 0.3–4.0)
CK, MB: 1.6 ng/mL (ref 0.3–4.0)
Relative Index: INVALID (ref 0.0–2.5)
Total CK: 46 U/L (ref 7–232)
Total CK: 46 U/L (ref 7–232)
Troponin I: 0.01 ng/mL (ref 0.00–0.06)
Troponin I: <0.01 ng/mL (ref 0.00–0.06)

## 2010-11-26 LAB — BASIC METABOLIC PANEL WITH GFR
BUN: 20 mg/dL (ref 6–23)
CO2: 29 meq/L (ref 19–32)
Calcium: 9.4 mg/dL (ref 8.4–10.5)
GFR calc non Af Amer: 52 mL/min — ABNORMAL LOW (ref >60)
Glucose, Bld: 98 mg/dL (ref 70–99)

## 2010-11-26 LAB — PROTIME-INR
INR: 1.06 (ref 0.00–1.49)
Prothrombin Time: 13.7 s (ref 11.6–15.2)

## 2010-12-02 ENCOUNTER — Ambulatory Visit: Payer: Self-pay | Admitting: Pulmonary Disease

## 2010-12-02 LAB — APTT: aPTT: 31 seconds (ref 24–37)

## 2010-12-02 LAB — DIFFERENTIAL
Basophils Relative: 2 % — ABNORMAL HIGH (ref 0–1)
Lymphocytes Relative: 17 % (ref 12–46)
Lymphs Abs: 0.9 10*3/uL (ref 0.7–4.0)
Monocytes Absolute: 0.5 10*3/uL (ref 0.1–1.0)
Monocytes Relative: 9 % (ref 3–12)
Neutro Abs: 2.9 10*3/uL (ref 1.7–7.7)

## 2010-12-02 LAB — COMPREHENSIVE METABOLIC PANEL
Albumin: 3.7 g/dL (ref 3.5–5.2)
Alkaline Phosphatase: 91 U/L (ref 39–117)
BUN: 16 mg/dL (ref 6–23)
Calcium: 9 mg/dL (ref 8.4–10.5)
Potassium: 3.9 mEq/L (ref 3.5–5.1)
Total Protein: 6.7 g/dL (ref 6.0–8.3)

## 2010-12-02 LAB — POCT CARDIAC MARKERS
Myoglobin, poc: 110 ng/mL (ref 12–200)
Troponin i, poc: <0.05 ng/mL (ref 0.00–0.09)

## 2010-12-02 LAB — CBC
HCT: 35.7 % — ABNORMAL LOW (ref 39.0–52.0)
Platelets: 247 10*3/uL (ref 150–400)
RDW: 13.6 % (ref 11.5–15.5)

## 2010-12-02 LAB — POCT B-TYPE NATRIURETIC PEPTIDE (BNP): B Natriuretic Peptide, POC: 43.3 pg/mL (ref 0–100)

## 2010-12-02 LAB — PROTIME-INR: INR: 1.05 (ref 0.00–1.49)

## 2010-12-03 ENCOUNTER — Encounter: Payer: Self-pay | Admitting: Pulmonary Disease

## 2010-12-05 ENCOUNTER — Ambulatory Visit (INDEPENDENT_AMBULATORY_CARE_PROVIDER_SITE_OTHER): Payer: Medicare Other | Admitting: Pulmonary Disease

## 2010-12-05 ENCOUNTER — Other Ambulatory Visit: Payer: Self-pay | Admitting: *Deleted

## 2010-12-05 ENCOUNTER — Encounter: Payer: Self-pay | Admitting: Pulmonary Disease

## 2010-12-05 VITALS — BP 144/86 | Temp 97.9°F | Ht 62.0 in | Wt 150.4 lb

## 2010-12-05 DIAGNOSIS — G4733 Obstructive sleep apnea (adult) (pediatric): Secondary | ICD-10-CM

## 2010-12-05 DIAGNOSIS — J310 Chronic rhinitis: Secondary | ICD-10-CM

## 2010-12-05 DIAGNOSIS — J9610 Chronic respiratory failure, unspecified whether with hypoxia or hypercapnia: Secondary | ICD-10-CM

## 2010-12-05 DIAGNOSIS — J309 Allergic rhinitis, unspecified: Secondary | ICD-10-CM

## 2010-12-05 DIAGNOSIS — F411 Generalized anxiety disorder: Secondary | ICD-10-CM

## 2010-12-05 DIAGNOSIS — J8410 Pulmonary fibrosis, unspecified: Secondary | ICD-10-CM

## 2010-12-05 MED ORDER — METHOTREXATE 2.5 MG PO TABS: ORAL_TABLET | ORAL | Status: DC

## 2010-12-05 MED ORDER — MOMETASONE FUROATE 50 MCG/ACT NA SUSP: 2.0000 | Freq: Every day | NASAL | Status: AC

## 2010-12-05 NOTE — Assessment & Plan Note (Signed)
He has pauci-immune vasculitis with positive P-ANCA, myeloperoxidase Ab.  He has been stable on MTX since Oct. 2010.  Will continue current dose of 7.5 mg per week.  He had recent lab tests at [facility].  Will get copies of these, and determine if additional lab tests are needed at this time.

## 2010-12-05 NOTE — Assessment & Plan Note (Signed)
Unable to tolerate BPAP due to ear problems.

## 2010-12-05 NOTE — Assessment & Plan Note (Signed)
Continue alprazolam as needed to avoid dynamic hyperinflation related to anxiety attacks.

## 2010-12-05 NOTE — Assessment & Plan Note (Signed)
Will refill his script for nasonex.

## 2010-12-05 NOTE — Assessment & Plan Note (Signed)
He is to continue on 3 to 4 liters oxygen 24/7.  Showed him how to use his nasal cannula to avoid nasal irritation.

## 2010-12-05 NOTE — Patient Instructions (Signed)
Will get lab test results from [facility]

## 2010-12-05 NOTE — Progress Notes (Signed)
Subjective:    Patient ID: [name], male    DOB: 10-May-1923, 77 y.o.   MRN: [id number]  HPI 77 year old male with known history of pauci-immune vasculitis with pulmonary fibrosis, hypoxemia, and sleep apnea unable to tolerate BIPAP.  He has been doing okay.  He has more sinus congestion and post-nasal drip.  He has sinus drainage which is clear to yellow.  He denies fever, epistaxis, or hemoptysis.  He is not coughing sputum from his chest.  He denies sore throat or difficulty swallowing.  He had cataract surgery, and this has helped his vision considerably.  He had lab tests at [facility].  He remains on methotrexate 7.5 mg per week.  Past Medical History  Diagnosis Date  . Pauci-immune vasculitis     3/02 P-ANCA>1:640, Myeloperoxidase Ab 17.6, ANA neg, RPR negative, RF<20. 7/09 ANCA<1:20, PFT 7/09 FEV1 1.90(81%), FVC 2.66 (71%), TLC 5.08 (87%), DLCO 42%, MTX started 06/18/09  . Pulmonary fibrosis   . Community acquired pneumonia   . Chronic hypoxemic respiratory failure     4 liters oxygen  . Chronic rhinitis   . Prostate cancer     s/p external radiation and hormonal therapy  . Renal cell carcinoma   . History of enucleation of left eyeball     age 42  . Congestive heart failure     Dr. Jens Som: ECHO 07/09 EF 50%  . Coronary artery disease   . Ventricular tachyarrhythmia   . Second degree AV block   . Aortic regurgitation   . Hypertension   . Hyperlipidemia   . Anxiety   . Osteoporosis   . OSA (obstructive sleep apnea)   . Bursitis of elbow   . Gout   . Thyroid cyst   . GERD (gastroesophageal reflux disease)      Family History  Problem Relation Age of Onset  . Coronary artery disease Father   . Tuberculosis Father   . Colon cancer Mother      History   Social History  . Marital Status: Widowed    Spouse Name: N/A    Number of Children: 3  . Years of Education: N/A   Occupational History  . architect     retired   Social History Main Topics  .  Smoking status: Never Smoker   . Smokeless tobacco: Not on file  . Alcohol Use: Not on file  . Drug Use: Not on file  . Sexually Active: Not on file   Other Topics Concern  . Not on file   Social History Narrative  . No narrative on file     Allergies  Allergen Reactions  . Ciprofloxacin     myalgia  . Levofloxacin     REACTION: hives  . Penicillins     rash     Outpatient Prescriptions Prior to Visit  Medication Sig Dispense Refill  . albuterol (PROAIR HFA) 108 (90 BASE) MCG/ACT inhaler Inhale 2 puffs into the lungs every 6 (six) hours as needed.        . ALPRAZolam (XANAX) 0.25 MG tablet Take 0.25 mg by mouth 3 (three) times daily as needed.        Marland Kitchen aspirin 81 MG tablet Take 81 mg by mouth daily.        Marland Kitchen atorvastatin (LIPITOR) 80 MG tablet Take 80 mg by mouth daily.        . Calcium Carbonate-Vitamin D (CALCIUM 600/VITAMIN D) 600-400 MG-UNIT per tablet Take 2 tablets by mouth  daily.        . carvedilol (COREG) 12.5 MG tablet Take 12.5 mg by mouth 2 (two) times daily with a meal.        . Coenzyme Q10 (COQ10) 100 MG CAPS Take 1 capsule by mouth daily.        Marland Kitchen dextromethorphan-guaiFENesin (MUCINEX DM) 30-600 MG per 12 hr tablet 1 every 12 hrs as needed      . folic acid (FOLVITE) 1 MG tablet Take 1 mg by mouth daily.        . furosemide (LASIX) 40 MG tablet Two tablets by mouth twice daily        . isosorbide mononitrate (IMDUR) 60 MG 24 hr tablet Take 60 mg by mouth daily.        . Multiple Vitamins-Minerals (MULTIVITAMIN WITH MINERALS) tablet Take 1 tablet by mouth daily.        . nitroGLYCERIN (NITROSTAT) 0.4 MG SL tablet Place 0.4 mg under the tongue every 5 (five) minutes as needed.        . potassium chloride SA (K-DUR,KLOR-CON) 20 MEQ tablet Take 2 tablets in AM and 1 tablet in the afternoon       . methotrexate (RHEUMATREX) 2.5 MG tablet Take 3 pills per week       . mometasone (NASONEX) 50 MCG/ACT nasal spray 2 sprays daily as needed          Review of  Systems     Objective:   Physical Exam Filed Vitals:   12/05/10 1211  BP: 144/86  Temp: 97.9 F (36.6 C)  TempSrc: Oral  Height: 5\' 2"  (1.575 m)  Weight: 150 lb 6.4 oz (68.221 kg)  SpO2: 97%     GEN: A/Ox3; chronically ill appearing.  HEENT: [place]/AT, , EACs-clear, TMs-wnl, NOSE-clear, THROAT-mild redness, no exudate  NECK: Supple w/ fair ROM; no JVD; normal carotid impulses w/o bruits; no thyromegaly or nodules palpated; right cervical adenopathy.  RESP Coarse BS w/ few rhonchi , no wheezing  CARD: RRR, no m/r/g  GI: Soft & nt; nml bowel sounds; no organomegaly or masses detected.  Musco: Warm bil, no calf tenderness clubbing, pulses intact , trace edema.  Neuro: intact     Assessment & Plan:   OBSTRUCTIVE SLEEP APNEA Unable to tolerate BPAP due to ear problems.  Chronic rhinitis Will refill his script for nasonex.  Postinflammatory pulmonary fibrosis He has pauci-immune vasculitis with positive P-ANCA, myeloperoxidase Ab.  He has been stable on MTX since Oct. 2010.  Will continue current dose of 7.5 mg per week.  He had recent lab tests at [facility].  Will get copies of these, and determine if additional lab tests are needed at this time.  Chronic respiratory failure He is to continue on 3 to 4 liters oxygen 24/7.  Showed him how to use his nasal cannula to avoid nasal irritation.  ANXIETY DISORDER Continue alprazolam as needed to avoid dynamic hyperinflation related to anxiety attacks.    Updated Medication List Outpatient Encounter Prescriptions as of 12/05/2010  Medication Sig Dispense Refill  . albuterol (PROAIR HFA) 108 (90 BASE) MCG/ACT inhaler Inhale 2 puffs into the lungs every 6 (six) hours as needed.        . ALPRAZolam (XANAX) 0.25 MG tablet Take 0.25 mg by mouth 3 (three) times daily as needed.        Marland Kitchen aspirin 81 MG tablet Take 81 mg by mouth daily.        Marland Kitchen atorvastatin (LIPITOR) 80 MG  tablet Take 80 mg by mouth daily.        . Calcium Carbonate-Vitamin  D (CALCIUM 600/VITAMIN D) 600-400 MG-UNIT per tablet Take 2 tablets by mouth daily.        . carvedilol (COREG) 12.5 MG tablet Take 12.5 mg by mouth 2 (two) times daily with a meal.        . Coenzyme Q10 (COQ10) 100 MG CAPS Take 1 capsule by mouth daily.        Marland Kitchen dextromethorphan-guaiFENesin (MUCINEX DM) 30-600 MG per 12 hr tablet 1 every 12 hrs as needed      . folic acid (FOLVITE) 1 MG tablet Take 1 mg by mouth daily.        . furosemide (LASIX) 40 MG tablet Two tablets by mouth twice daily        . isosorbide mononitrate (IMDUR) 60 MG 24 hr tablet Take 60 mg by mouth daily.        . methotrexate (RHEUMATREX) 2.5 MG tablet Take 3 pills per week  12 tablet  6  . mometasone (NASONEX) 50 MCG/ACT nasal spray 2 sprays by Nasal route daily. 2 sprays daily as needed  17 g  6  . Multiple Vitamins-Minerals (MULTIVITAMIN WITH MINERALS) tablet Take 1 tablet by mouth daily.        . nitroGLYCERIN (NITROSTAT) 0.4 MG SL tablet Place 0.4 mg under the tongue every 5 (five) minutes as needed.        . potassium chloride SA (K-DUR,KLOR-CON) 20 MEQ tablet Take 2 tablets in AM and 1 tablet in the afternoon       . DISCONTD: methotrexate (RHEUMATREX) 2.5 MG tablet Take 3 pills per week       . DISCONTD: mometasone (NASONEX) 50 MCG/ACT nasal spray 2 sprays daily as needed

## 2010-12-16 ENCOUNTER — Other Ambulatory Visit: Payer: Self-pay | Admitting: Cardiology

## 2010-12-19 ENCOUNTER — Ambulatory Visit (INDEPENDENT_AMBULATORY_CARE_PROVIDER_SITE_OTHER): Payer: Medicare Other | Admitting: *Deleted

## 2010-12-19 ENCOUNTER — Other Ambulatory Visit: Payer: Self-pay

## 2010-12-19 DIAGNOSIS — I441 Atrioventricular block, second degree: Secondary | ICD-10-CM

## 2010-12-26 NOTE — Progress Notes (Signed)
Pacer remote 

## 2010-12-29 ENCOUNTER — Other Ambulatory Visit: Payer: Self-pay | Admitting: Family Medicine

## 2010-12-31 ENCOUNTER — Telehealth: Payer: Self-pay | Admitting: Pulmonary Disease

## 2010-12-31 NOTE — Telephone Encounter (Signed)
Forwarded to Dr. Sood for review. °

## 2011-01-06 ENCOUNTER — Encounter: Payer: Self-pay | Admitting: Pulmonary Disease

## 2011-01-07 ENCOUNTER — Encounter: Payer: Self-pay | Admitting: *Deleted

## 2011-01-10 ENCOUNTER — Other Ambulatory Visit: Payer: Self-pay | Admitting: Otolaryngology

## 2011-01-10 DIAGNOSIS — M542 Cervicalgia: Secondary | ICD-10-CM

## 2011-01-16 ENCOUNTER — Ambulatory Visit
Admission: RE | Admit: 2011-01-16 | Discharge: 2011-01-16 | Disposition: A | Discharge status: Home / Self Care | Payer: Medicare Other | Source: Ambulatory Visit | Attending: Otolaryngology | Admitting: Otolaryngology

## 2011-01-16 DIAGNOSIS — M542 Cervicalgia: Secondary | ICD-10-CM

## 2011-01-16 MED ORDER — IOHEXOL 300 MG/ML  SOLN
75.0000 mL | Freq: Once | INTRAMUSCULAR | Status: AC | PRN
  Administered 2011-01-16: 75 mL via INTRAVENOUS

## 2011-01-17 ENCOUNTER — Encounter: Payer: Self-pay | Admitting: Pulmonary Disease

## 2011-01-21 NOTE — Consult Note (Signed)
NAME:  [name], [name]                ACCOUNT NO.:  [id number]   MEDICAL RECORD NO.:  [id number]          PATIENT TYPE:  INP   LOCATION:  5155                         FACILITY:  [facility]   PHYSICIAN:  [name]. Bensimhon, MDDATE OF BIRTH:  May 03, 1923   DATE OF CONSULTATION:  04/20/2007  DATE OF DISCHARGE:                                 CONSULTATION   PRIMARY CARE PHYSICIANS:  Dr. Coralyn Helling, Dr. Gershon Crane.   PHYSICIANS:  Cardiologist Dr. Berton Mount.   REASON FOR CONSULTATION:  Congestive heart failure in the setting of  decreasing ejection fraction.   HISTORY OF PRESENT ILLNESS:  [name] is an 77 year old man with  multiple medical problems.  He does have a history of coronary artery  disease and is status post coronary bypass grafting in 2002 after an  apparent silent myocardial infarction.  He also has a history of  complete heart block and is status post permanent pacemaker placement in  2007.  The remainder of his medical history is notable for hypertension,  hyperlipidemia, renal cell carcinoma status post wedge resection of the  left kidney, and prostate cancer status post radiation therapy.   Recently he has had quite a bit of problems with apparent pulmonary  fibrosis and bronchiectasis and he has been followed in the clinic by  Dr. Craige Cotta.  Back in April he had problems with very significant  hemoptysis.  Recently in clinic he was having increasing shortness of  breath to the point where he was having trouble getting around and also  had some recurrent hemoptysis.  Chest x-ray showed a possible left upper  lobe infiltrate and increased interstitial markings.  He was admitted.  While in the hospital he was noted to have some lower extremity edema.  Another 2D echocardiogram was obtained.  Ejection fraction is in the 35  to 40% range with a mildly dilated echocardiogram.  We compared this to  his echocardiogram back in January which showed an ejection fraction of  closer  to 50%.   REVIEW OF SYSTEMS:  He denies any chest pain.  Initially he said he can  walk just about as much as he wants with some limitation on hills.  However, his daughter says over the past few weeks he has really been  much more limited and has trouble with just his activities of daily  living.  He has had some lower extremity edema.  He denies any  orthopnea, PND, although this is confounded by sleep apnea.  He has had  mild hemoptysis but no high volume bleeding.  The remainder of Review of  Systems is negative except for HPI and problem list.   PROBLEM LIST:  1. Coronary artery disease status post bypass surgery in 2002.  2. History of non-ST elevation myocardial infarction.  3. A complete heart block status post pacemaker placement December      2007.  4. Bronchiectasis/pulmonary fibrosis secondary to possible vasculitis.  5. Renal cell carcinoma status post partial left nephrectomy.  6. Prostate cancer status post radiation therapy currently on hormone      injection.  7. History of eye cancer status post left eye enucleation.  8. Hypertension.  9. Hyperlipidemia.  10.Gastroesophageal reflux disease.  11.Chronic renal insufficiency.   CURRENT MEDICATIONS IN HOSPITAL:  1. Atorvastatin 80 mg a day.  2. Guaifenesin.  3. Pantoprazole 40 a day.  4. Clonidine 0.2 b.i.d. (this was recently started after his Avapro      was stopped during his hospitalization).  5. Prednisone 60 a day.  6. Avelox 400 IV daily.  7. Ultram.  8. He is not on aspirin due to bleeding.   DRUG ALLERGIES:  PENICILLIN and ACE INHIBITORS which cause a cough.   SOCIAL HISTORY:  He is widowed.  He is a former Technical sales engineer.  He has three  grandchildren.  He has never smoked.  Drinks wine on occasion.  No drug  history.   FAMILY HISTORY:  Positive for colon cancer in his mother and heart  disease in his father.   PHYSICAL EXAMINATION:  GENERAL:  He is somewhat chronically ill-  appearing, but in no acute  distress.  Respirations are unlabored.  VITAL SIGNS:  Blood pressure 110/66, heart rate 66, temperature 97.5,  satting 96% on room air.  HEENT:  Status post left eye enucleation; otherwise, normal.  NECK:  Supple and thick.  It is hard to assess his CVP.  It seems  probably mildly elevated in the 7-8 cm range, though once again this is  difficult to see.  Carotids are 1+ bilaterally.  There is a soft bruit  on the right.  There is no lymphadenopathy or thyromegaly.  CARDIAC:  PMI is mildly and laterally displaced.  He has regular rate  and rhythm.  No murmurs, rubs or gallops.  LUNGS:  Have bibasilar crackles.  There is no wheezing.  ABDOMEN:  Soft, nontender, nondistended.  No hepatosplenomegaly, no  bruits, no masses, good bowel sounds.  EXTREMITIES:  Warm with no cyanosis or clubbing.  There is 2+ edema  worse on the right where his saphenous vein graft was harvested.  There  is no rash.  NEUROLOGICAL:  Alert and oriented x3.  Cranial nerves II-XII are intact.  He moves all four extremities without difficulty.  Affect is very  pleasant.   EKG shows sinus rhythm with left anterior fascicular block and LVH, rate  is 76.  There is no ST-T wave changes.   LABORATORY DATA:  Sodium 140, potassium 4.4, glucose 88, BUN 36,  creatinine 1.5.  White count 10.3, hemoglobin 15.1, platelet count 271.  PSA 0.26.  BNP 455.  Previously it was 552.   ASSESSMENT:  1. Congestive heart failure with mild decrease in left ventricular      function since previous.  2. Coronary artery disease status post CABG.  3. Premature ventricular complexes.  4. History of high degree heart block status post pacemaker.  5. Pulmonary fibrosis/bronchiectasis, question secondary to vasculitis      with ongoing hemoptysis.  6. Chronic renal insufficiency with history of renal cell carcinoma      status post partial nephrectomy.   PLAN/DISCUSSION:  [name] has clearly had a mild change in his LV  function and size  since January 2008 associated with clinical heart  failure.  I am unclear of what has caused this.  Diagnostic  possibilities include ischemia which I think is unlikely, normal immune  process, viral idiopathic or perhaps something related to his PVC's or  pacer or simply just idiopathic.  Given his hemoptysis he is obviously  not a  good candidate for prior catheterization and coronary  intervention.  At this point I would favor aggressive medical therapy.  Will start Coreg at 3.125 mg twice a day, and this should be tolerated  as quickly as he can tolerate.  Would also continue his ARB and stop  clonidine.  Will plan an outpatient Myoview for risk stratification.  Given his volume overload will start  Lasix 40 mg a day and potassium at 20 a day.  I actually would give it  to him every other day.  Would watch his renal function closely.  We  will schedule him for followup in the heart failure clinic.  We  appreciate the consult.  Please do not hesitate to call with any  questions.      [name]. Bensimhon, MD  Electronically Signed     DRB/MEDQ  D:  04/20/2007  T:  04/20/2007  Job:  [id number]   cc:   Coralyn Helling, MD  Tera Mater. Clent Ridges, MD  Duke Salvia, MD, [facility]

## 2011-01-21 NOTE — Assessment & Plan Note (Signed)
[facility]                          CHRONIC HEART FAILURE NOTE   NAME:[name], [name]                       MRN:          [id number]  DATE:06/29/2007                            DOB:          08-11-1923    [name] returns today for followup of his congestive heart failure,  which is most likely secondary to ischemic cardiomyopathy with an EF  currently 40%.  He is status post MI and coronary artery bypass grafting  in 2001.  [name] also has multiple comorbidities, the most prominent  being pulmonary vasculitis and ongoing hemoptysis.  He is followed  closely by Dr. Craige [name].  [name] states he just saw Dr. Craige [name] recently and  was told everything was stable at this time, and to continue current  medications.  [name] depends on chronic supplemental O2 use and is  maintained on chronic steroid therapy, followed by Dr. Craige [name].  [name]  states compliance with his medications.  Weight has been stable at home,  171 to 173.  Denied any light-headedness or dizziness.   PAST MEDICAL HISTORY:  1. Includes congestive heart failure, most likely secondary to      ischemic cardiomyopathy with an EF of 40%.  2. History of coronary artery disease status post CABG.  3. History of complete heart block status post pacemaker implantation.  4. Status post recent hospitalization for community-acquired pneumonia      in the setting of pulmonary vasculitis and hemoptysis.  5. Obstructive sleep apnea.  6. Chronic O2 dependence.  7. History of ACE inhibitor induced cough.  8. History of right carotid bruit pending repeat carotid Dopplers.  9. History of nonsustained ventricular tachycardia.  10.Hypertension.  11.Hyperlipidemia.  12.Prostate cancer status post radiation therapy now on Lupron      injections, followed by Dr. Patsi [name].  13.History of renal cell carcinoma status post partial left      nephrectomy.  14.Status post left eye tumor requiring  enucleation.   REVIEW OF SYSTEMS:  As stated above.   CURRENT MEDICATIONS:  1. Lipitor 80.  2. Co-Q10.  3. Multivitamin.  4. Vitamin C.  5. Vitamin E.  6. Lupron.  7. Vitamin D.  8. Furosemide 40 mg daily.  9. Protonix 40 mg daily.  10.Cozaar 25 mg daily.  11.Potassium 40 mEq daily.  12.Prednisone 20 mg daily.  13.Carvedilol 18.75 mg b.i.d.   PHYSICAL EXAM:  Weight 199, blood pressure 154/85 with a heart rate of  75.  [name] is in no acute distress.  He is a chronically ill-appearing  Caucasian gentleman.  O2 supplemental via nasal cannula is intact.  NECK:  Without signs of jugular venous distension.  CHEST:  Clear to auscultation with dry crackles at the bases.  CARDIOVASCULAR:  Reveals an S1, S2.  Regular rate and rhythm.  ABDOMEN:  Soft and nontender.  Obese.  LOWER EXTREMITIES:  Without cyanosis, clubbing, or edema.   IMPRESSION:  Congestive heart failure secondary to ischemic  cardiomyopathy with an ejection fraction of 40% at this time.  In  review, [name] weight is recorded at 199  today.  I doubt the  accuracy of this documentation, as he states his appetite has been  essentially the same and does not have any overt signs of volume  overload.  I am going to continue his medications at the current dose,  except for his carvedilol.  I am going to increase it to 25 mg b.i.d.  I  have given him a new prescription for 25 mg twice a day.  He will use up  the 12.5 mg he has at home and take them 2 twice a day.  We will plan on  repeating echocardiogram in January.  In the meantime, [name] is to  follow up with Dr. Jens [name] for routine cardiology visit.  Apparently,  the office was unable to proceed with carotid Doppler today.  This will  be rescheduled for the patient's convenience.  He states he would like  to have it done when he has his appointment with Dr. Jens [name].  This  will be arranged today, prior to him leaving.      [name], ACNP   Electronically Signed      [name]. Bensimhon, MD  Electronically Signed   MB/MedQ  DD: 06/29/2007  DT: 06/30/2007  Job #: [id number]

## 2011-01-21 NOTE — Assessment & Plan Note (Signed)
[facility]                          CHRONIC HEART FAILURE NOTE   NAME:[name], [name]                       MRN:          [id number]  DATE:05/04/2007                            DOB:          July 10, 1923    PRIMARY CARDIOLOGIST:  Madolyn Frieze. Jens Som, MD, [facility].   PULMONOLOGIST:  Coralyn Helling, M.D.   ELECTROPHYSIOLOGIST:  Duke Salvia, MD, [facility].   Mr. Laredo returns today for followup of his congestive heart failure,  status post recent hospitalization.  I initially saw Mr. Vanblarcom as a new  patient approximately one week ago.  Mr. Tokunaga was in mild volume  overload at that time.  I increased his Lasix to 40 mg daily, and I  obtained blood work on him.  Blood work at that visit, potassium 5, BUN  40, creatinine 1.4, BNP 249.  Patient returns today for followup.  He  complains of increased weakness and increased shortness of breath.   Mr. Mccravy unfortunately has severe lung disease also, status post recent  hospitalization for community-acquired pneumonia in the setting of  pulmonary vasculitis with recurrent hemoptysis.  Mr. Bohlken states the  hemoptysis has since resolved.  He still has a cough, occasional clear  sputum produced.  He continues to wear his O2 2 liters nightly; however,  he complains of increased shortness of breath and increased generalized  weakness.  He has also had some episodes of chest discomfort.  He  describes it as a mild heaviness.  Does not occur more frequent or more  intense.  Is not exactly associated with any position or activity.  He  usually notices the heaviness in his chest when he is more short of  breath.  He continues to present with Class III New York Heart  Association symptoms.  He underwent a stress Myoview on April 27, 2007,  interpreted by Dr. Jens Som, that showed low risk Adenosine nuclear  study with prior inferior infarct and trivial pericardial infarct  ischemia.  The stress Myoview was ordered as the  patient's heart failure  has unclear etiology at this time.  Mr. Hollyfield does, however, have a  longstanding history of coronary artery disease, underwent bypass  surgery in 2001, status post MI.  He also has a previous implantation of  a pacemaker secondary to complete heart block.  He denies any fever.  He  complains of hot flashes, which has been a recurring problem, as he is  on Lupron therapy. He also complains of chills sporadically.   PAST MEDICAL HISTORY:  1. Congestive heart failure, most likely secondary to ischemic      cardiomyopathy with an ejection fraction of currently 40%.  2. History of coronary artery disease, status post MI and coronary      artery bypass surgery in 2001.  3. History of complete heart block, status post implantation of a      pacemaker.  4. Status post recent hospitalization for community-acquired pneumonia      in the setting of pulmonary vasculitis and hemoptysis.  5. Obstructive sleep apnea.  6. O2 2 liters nightly.  7.  History of ACE inhibitor-induced cough.  8. History of right carotid bruit.  9. History of nonsustained V tach.  10.Status post carotid Doppler showing 1-39% bilateral ICA stenosis in      2003.      a.     Most recent carotid Doppler in 2006 showed 0-39% bilateral       ICA stenosis with mild nonobstructive plaque.  Moderate-to-severe       stenosis at the RECA ostia most likely accounts for the bruit with       recommendations for a follow-up carotid Doppler in two years.  11.Hypertension.  12.Hyperlipidemia.  13.Prostate cancer, status post radiation therapy, now on monthly      Lupron injection, followed by Dr. Patsi Sears.  14.History of renal cell carcinoma, status post partial left      nephrectomy.  15.GERD.  16.Left eye tumor, status post enucleation.   REVIEW OF SYSTEMS:  As stated above.   CURRENT MEDICATIONS:  1. Lasix 40 mg daily.  2. Coreg 6.25 mg p.o. b.i.d.  3. Lupron injection.  4. Prednisone 50 mg  daily.  5. Cozaar 50 mg daily.  6. Lipitor 80 mg daily.  7. Potassium 20 mEq daily.  8. Pantoprazole 40 mg daily.  9. CoQ 10 and vitamins C and E and multivitamin daily.   PHYSICAL EXAMINATION:  Weight 176.  Patient's weight is down 2 pounds  from last week.  Blood pressure 115/63 with a heart rate of 80.  Mr. Fuhrmann is in no acute distress.  He is a very pleasant gentleman.  He  is mildly dyspneic at rest and conversing with me.  JVD not noted at 45 degree angle.  LUNGS:  Clear to auscultation at this time.  CARDIOVASCULAR:  An S1 and S2.  Regular rate and rhythm.  ABDOMEN:  Soft and nontender.  Positive bowel sounds.  LOWER EXTREMITIES:  Patient without edema at this time.  NEUROLOGIC:  Alert and oriented x3.  Ambulating in the clinic without  assistance.   A 12-lead EKG without magnet, sinus rhythm, left anterior fascicular  block at a rate of 80.  With magnet, patient AV pacing at a rate of 84.   CLINICAL DATA:  Patient ambulating greater than 300 feet.  Prior to  ambulating, resting pulse oximetry rate 91%.  After ambulating 300 feet,  pulse oximetry dropped to 84% on room air.  Patient returned to baseline  five minutes after exertion.   IMPRESSION:  Congestive heart failure without signs of volume overload  at this time; however, the patient still with class III New York Heart  Association symptoms.  In the setting of known lung disease, experienced  hypoxic episode with ambulating here in the clinic today.  He has also  expressed some concern over chest discomfort he has had in the setting  of shortness of breath, status post stress Myoview last week that showed  findings consistent with prior inferior infarct and trivial peri-infarct  ischemia.  Will have Dr. Jens Som review stress Myoview with patient at  his follow-up appointment.  Patient's increased dyspnea is somewhat  concerning. He will probably need daytime O2 in addition to his nightly  use. I ambulated the  patient in the clinic last week, the same scenario.  His resting pulse ox was 94% at that time.  Pulse ox dropped at 91% at  the lowest after ambulating.  The patient has an appointment with Dr.  Craige Cotta tomorrow afternoon for post-hospital followup.  I will see the  patient back after his appointment with Dr. Craige Cotta and will plan on  arranging patient for a CPX test for further evaluation of his increased  dyspnea, if okay with Dr. Craige Cotta.  Will also check lab work today, the  results of which will be available for Dr. Craige Cotta to evaluate tomorrow.  Also will continue current medications.      Dorian Pod, ACNP  Electronically Signed      [name]. Bensimhon, MD  Electronically Signed   MB/MedQ  DD: 05/04/2007  DT: 05/04/2007  Job #: [id number]

## 2011-01-21 NOTE — Assessment & Plan Note (Signed)
[facility]                             [name], [name]                       MRN:          [id number]  DATE:03/17/2007                            DOB:          1923/02/08    I saw [name] in followup for [name] pauci-immune vasculitis, hemoptysis,  pneumonia, hypoxemia, and obstructive sleep apnea.  Since [name] last visit  with me, he was evaluated by my nurse practitioner, Rubye Oaks, on  June 26, at which time it was felt that he had an acute flare and was  given a course of Levaquin for 7 days as well as increasing [name] dose of  prednisone.  He said that when he had [name] prednisone dose increased, he  felt like [name] symptoms had improved but then as the dose was tapered  down, [name] symptoms recurred.  Apparently he is having more cough with  production of clear to pinkish colored sputum.  He is not having any  fevers, chills, or sweats.  He denies having any chest pains or  palpitations.  He is not having any wheezing.  He denies any abdominal  pain, nausea, or vomiting.  He also denies diarrhea.  He is not having  much with regard to leg swelling.  He was evaluated by [name] urologist,  and there was concern that he may possibly have recurrence of [name] renal  cell carcinoma and as a result he had undergone a CT scan of [name] chest,  abdomen, and pelvis last week.  Unfortunately, I do not have those  results available for my review at this time.   MEDICATIONS:  1. Prednisone 5 mg daily.  2. Lipitor 80 mg daily.  3. Cozyme-Q-10 once daily.  4. Cozaar 100 mg daily.  5. Multivitamin daily.  6. Folic acid daily.  7. He is on oxygen at 2 L at night.   PHYSICAL EXAMINATION:  He is 174 pounds.  Temperature is 97.7.  Blood  pressure is 120/70.  Heart rate is 88.  Oxygen saturation 95% on room  air.  HEENT:  There is no sinus tenderness, no nasal discharge or lesions.  No  lymphadenopathy.  HEART:  S1, S2.  CHEST:  Bibasilar  rales.  ABDOMEN:  Soft, nontender.  EXTREMITIES:  No edema.   IMPRESSION:  1. Pauci-immune vasculitis.  I believe [name] current symptoms are      probably related to a flare of this.  He does not have anything      which would suggest an infectious etiology, and he has also      received an additional course of antibiotics recently.  What I will      do is increase the dose of prednisone to 30 mg a day x2 weeks and      then have him decrease it to 25 mg a day for another 2weeks, and I      will follow up with him in approximately 3-4 weeks.  I will  hold      off on ordering any imaging studies at this time until I  have a      chance to review [name] CT scan of the chest, abdomen, and pelvis      which was done with [name] urologist last week.  2. Hypoxemia.  I will have him continue on [name] current settings of      supplemental oxygen and      reassess this at [name] next followup.  3. Obstructive sleep apnea.  Again he would prefer to defer any      interventions for this until he is somewhat more stable.     Coralyn Helling, MD  Electronically Signed    VS/MedQ  DD: 03/19/2007  DT: 03/20/2007  Job #: [id number]

## 2011-01-21 NOTE — Assessment & Plan Note (Signed)
 [facility]                         ELECTROPHYSIOLOGY OFFICE NOTE   NAME:[name], [name]                       MRN:          [id number]  DATE:01/12/2007                            DOB:          11-01-1922    HISTORY OF PRESENT ILLNESS:  Mr. Ladnier comes in today.  He is status  post pacemaker implantation for second degree heart block.  He has  ischemic heart disease and has been having problems with pulmonary  fibrosis related to vasculitis.   He is having problems with oxygen desaturation.  He has had no chest  pain.   MEDICATIONS:  1. Lipitor.  2. Cozaar.  3. Prednisone.  4. Coenzyme-Q.   PHYSICAL EXAMINATION:  VITAL SIGNS:  Blood pressure 141/72, pulse 66.  LUNGS:  Clear.  HEART:  Sounds were regular.  He was wearing oxygen.   CLINICAL COURSE:  Interrogation of his Medtronic rhythm device  demonstrates a P wave of 2 and impendence of 504, threshold of 0.5 and  0.4.  The R wave was 11.2 with impendence of 664, threshold of one volt  at 0.2, battery voltage 3.03.  There are multiple episodes of  nonsustained ventricular tachycardia.  Mostly they occurred on August 25  and 26.   IMPRESSION:  1. Second degree heart block.  2. Ischemic heart disease.      a.     Status post bypass grafting.      b.     Ejection fraction of 48% by Myoview in 2006.  3. New onset episodes of ventricular tachycardia.  4. Pneumonia, bronchiectasis and pulmonary fibrosis.   PLAN:  The question is what is the cause of his VT and what is his  ejection fraction?  We will plan to get an echocardiogram to look at the  latter and will get a troponin and electrolytes to look at the other  possibilities whether he was getting hypoxic during that time, and he  will try and look back and see what was going on that day.   We will plan to see him in about two months time to see how it is that  he has been doing, and we will review the data, obviously when he   returns.    Duke Salvia, MD, [facility]  Electronically Signed   SCK/MedQ  DD: 01/12/2007  DT: 01/12/2007  Job #: [id number]

## 2011-01-21 NOTE — Assessment & Plan Note (Signed)
[facility]                             [name], [name]                       MRN:          [id number]  DATE:07/29/2007                            DOB:          06-Jun-1923    I saw Mr. Dumond in followup today for his pauci-immune vasculitis with  pulmonary fibrosis, hypoxemia and obstructive sleep apnea.  He has not  noticed any worsening of his symptoms since having his dose of  prednisone decreased to 20 mg a day.  States he gets an occasional cough  in the morning but otherwise feels his breathing is doing reasonably  well.  He says he has been able to tolerate using a C-PAP machine much  better now and is presently using a full face mask with a humidifier.  He says he has been having problems with condensation building up around  his mask occasionally but otherwise seems to be tolerating the C-PAP  machine much better.  He does complain of having occasional nosebleeds.   CURRENT MEDICATIONS:  Pantoprazole 40 mg daily, Furosemide 40 mg daily,  prednisone 20 mg daily, Cozaar 25 mg daily, Lipitor 80 mg daily, vitamin  D, Coenzyme Q-10,  Coreg 25 mg b.i.d., multivitamin, vitamin C and vitamin E.   PHYSICAL EXAMINATION:  GENERAL:  Weight is 180 pounds, temperature is  98.3, blood pressure 150/83, heart rate 76, oxygen saturation 93% on 2  liters.  HEENT:  There is no sinus tenderness. He has crusted blood in his left  naris. There are no oral lesions, no lymphadenopathy.  HEART:  S1, S2.  CHEST:  There was no wheezing or rales.  ABDOMEN:  Soft, nontender.  EXTREMITIES:  No edema.   IMPRESSION:  1. Pauci-immune vasculitis with pulmonary fibrosis. He appears to have      improved with regards to his symptoms as well as his clinical exam.      I will therefore have him increase the dose of prednisone to 15 mg      daily for the next 2 weeks and if he is able to tolerate this then      decrease to 10 mg daily for 2 weeks  and then I plan on following up      with him in approximately 1 months.  2. Obstructive sleep apnea.  He is to continue on his C-PAP for the      time being as he continues to adjust to the use of this. I      discussed with him techniques to compensate for his  rain out.  I      will then obtain a download from his C-PAP machine in approximately      2 weeks.  3. Hypoxemia.  He is to continue on supplemental oxygen at 2 liters      continuously.  4. Intermittent episodes of epistaxis.  He is due to have followup      with Dr. Pollyann Kennedy for further assessment of this.     Coralyn Helling, MD  Electronically Signed    VS/MedQ  DD: 07/29/2007  DT: 07/29/2007  Job #: [id number]   cc:   [name]. Bensimhon, MD

## 2011-01-21 NOTE — Assessment & Plan Note (Signed)
[facility]                         ELECTROPHYSIOLOGY OFFICE NOTE   NAME:[name], [name]                       MRN:          [id number]  DATE:03/17/2007                            DOB:          01-Nov-1922    [name] is seen following pacemaker implantation for second-degree  heart block.  This occurs in the setting of ischemic heart disease and  prior bypass.  He has no significant chest pain.   He was hospitalized in April for pneumonia, and his pulmonary fibrosis  and bronchiectasis problem is still quite active.  He is using nocturnal  oxygen.   His medications are reviewed.  They are really quite limited.  He is on  Cozaar.  Aspirin has been discontinued.  He is also not on a beta  blocker because of his lung disease.   PHYSICAL EXAMINATION:  VITAL SIGNS:  Blood pressure 128/88, pulse 80.  LUNGS:  Diminished.  CARDIAC:  Heart sounds were regular.   Interrogation of his pacemaker demonstrates eight ventricular heart rate  episodes.  He is also having a couple of fast atrial episodes.  These  were all of very brief duration.   IMPRESSION:  1. Ischemic heart disease.      a.     Prior bypass.      b.     Ejection fraction of about 45%.  2. Second-degree heart block.  3. Status post pacemaker for #2.  4. Nonspecific ventricular tachycardia.   [name] is actually doing really quite well.  He is to see Dr.  Jens Som.  At that time, a question of about whether he could tolerate a  low-dose beta blocker should be reviewed.  He is not on aspirin because  of his lung disease but hopefully that can be readdressed.     Duke Salvia, MD, [facility]  Electronically Signed    SCK/MedQ  DD: 03/17/2007  DT: 03/18/2007  Job #: [id number]

## 2011-01-21 NOTE — Discharge Summary (Signed)
[name], [name]                ACCOUNT NO.:  [id number]   MEDICAL RECORD NO.:  [id number]          PATIENT TYPE:  INP   LOCATION:  5155                         FACILITY:  [facility]   PHYSICIAN:  Coralyn Helling, MD        DATE OF BIRTH:  08-Jan-1923   DATE OF ADMISSION:  04/15/2007  DATE OF DISCHARGE:  04/20/2007                               DISCHARGE SUMMARY   DISCHARGE DIAGNOSES:  1. Community-acquired pneumonia (no organism specified).  2. Pulmonary vasculitis.  3. Congestive heart failure/systolic heart failure.   LABORATORY DATA:  Date April 20, 2007:  Sodium 140, potassium 4.4,  chloride 105, CO2 27, glucose 88, BUN 36, creatinine 1.53, calcium 8.9.  Date April 20, 2007:  White blood cell count 10.3, hemoglobin 15.1,  hematocrit 43.8, platelet count 271.  Date April 17, 2007:  BNP 455.   Echocardiogram obtained on April 19, 2007, findings:  Left ventricular  ejection fraction is depressed at approximately 30-35% with diffuse  hypokinesis; question posterior akinesis.  The left ventricle was mildly  dilated, left ventricular wall thickness normal.  Doppler  interpretations were consistent with mild diastolic dysfunction.  There  was mild aortic valvular regurgitation, mild mitral valve regurgitation.   RADIOLOGY:  Date April 18, 2007:  Left upper lobe, left suprahilar  airspace disease, right lower lobe airspace disease, slight improvement  compared to prior evaluation.   BRIEF HISTORY:  This is an 77 year old male patient followed by Dr. Craige Cotta  with a past medical history of bilateral pulmonary infiltrate with p-  ANCA positivity, felt to be secondary to pauci-immune vasculitis, with  recurrent hemoptysis and obstructive sleep apnea.  He was seen in the  office on April 15, 2007, by Rubye Oaks, NP, as a follow-up for a  chief complaint of hemoptysis.  He was initially seen 3 days prior to  admission with increased cough, bright red hemoptysis, and increased  shortness of  breath with activity.  Chest x-ray demonstrated increased  interstitial markings.  His baseline prednisone was increased from 25 to  40 mg daily, he returned with increased hemoptysis.  He denied fever,  purulent sputum, chest pain, palpitations, or presyncopal episodes.  He  was admitted for failure to respond to outpatient therapy.   PAST MEDICAL HISTORY:  1. Renal cell carcinoma, status post partial left nephrectomy.  2. Prostate cancer, status post radiation therapy and monthly hormone      injections.  3. Coronary artery disease, status post CABG x4 vessels in 2002.  4. Complete heart block, status post pacemaker in 2007.  5. Gastroesophageal reflux disease.  6. Hyperlipidemia.  7. Hypertension.  8. 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   HOSPITAL COURSE BY DISCHARGE DIAGNOSIS:  Problem 1.  HEMOPTYSIS WITH RESOLVING DYSPNEA IN THE SETTING OF PROBABLE  COMMUNITY-ACQUIRED PNEUMONIA (NO ORGANISM SPECIFIED) IN THE SETTING OF  KNOWN VASCULITIS:  [name] was admitted to the pulmonary critical care  service.  Inpatient therapy consisted of IV empiric antibiotics, IV  systemic steroids and bronchodilators, and supplemental oxygen.  His  hemoptysis continued to improve over the  course of his hospitalization,  as have his pulmonary infiltrates.  He is currently upon the time of  discharge afebrile and back to baseline.  He will be discharged to home  to complete a 10-day course of antibiotics with a slow prednisone taper.   Problem 2.  HISTORY OF PULMONARY VASCULITIS (ANCA POSITIVE, PAUCI-IMMUNE  VASCULITIS):  This was not felt to be the major factor in his acute  decompensation but certainly remains a consideration.  Upon the time of  discharge the outpatient plan will be to consider follow-up high-  resolution CT of the chest and consider biopsy.   Problem 3.  HYPERTENSION:  The patient continues to demonstrate  hypertension during the course of his hospitalization;  therefore, an  echocardiogram was obtained which showed significant left ventricular  dysfunction as well as diastolic dysfunction.  He will be discharged to  home on an antihypertensive regimen consisting of Cozaar.  Additionally,  he has had every other day diuresis and Coreg added to his regimen.   Problem 4.  CONGESTIVE HEART FAILURE, SYSTOLIC AND DIASTOLIC HEART  DYSFUNCTION:  New echocardiogram obtained on April 19, 2007,  demonstrated an ejection fraction of estimated 30-35%.  This is in  comparison to a January 2008 echocardiogram, at which time his ejection  fraction was estimated between 45 and 50%.  It is unclear the etiology  of his worsening systolic heart function.  [name]. Bensimhon, MD, saw  [name] in consultation prior to discharge on April 15, 2007.  There  was a question as to whether or not this was indeed an ischemia, whether  or not there was actually an ischemic event versus an autoimmune, viral  or idiopathic etiology.  Given his persistent hemoptysis, he was deemed  not to be a good candidate for cardiac catheterization or PCI.  Following cardiac evaluation Mr. Mooney was recommended to resume ARB  therapy, add Coreg twice daily, discontinue clonidine, which is the  medicine he was receiving during the hospitalization, and finally  beginning every other day diuresis.  Additionally, he has a follow-up in  the [facility] CHF clinic with Dr. Jens Som for further management of his  cardiac dysfunction.   DISCHARGE INSTRUCTIONS:  1. Diet:  Low-sodium diet.  2. Activity:  Increase as tolerated.   FOLLOW-UP:  1. [facility] on April 26, 2007, at 2 p.m.  2. He has a stress test scheduled on April 27, 2007, at [facility]      Cardiology clinic.  3. He has follow-up with Dr. Craige Cotta on May 05, 2007.   DISCHARGE MEDICATIONS:  1. Coreg 3.125 mg tablet, one tablet twice a day.  2. Lasix 40 mg tablet, one tablet every other day.  3. Avelox 400 mg tablet, one  tablet daily x5 days.  4. Prednisone taper, 20 mg tablets, take 2-1/2 tablets daily x4 days,      then 2 tablets daily x4 days, then 1-1/2 tablets daily x4 days and      hold at this dose until instructed otherwise by Dr. Craige Cotta.  5. Lipitor 80 mg daily.  6. CoQ-10 daily.  7. Vitamin C and vitamin E daily.  8. Multivitamin daily.  9. Protonix 40 mg daily, instructed to take prior to breakfast.  10.K-Dur 2 mEq every other day, instructed to take with Lasix.  11.Cozaar 50 mg tablet daily.  12.Oxygen 2 L at h.s. for sleep apnea.   Upon the time of discharge, Mr. Urbas reports improvement in overall  clinical status.  His hemoptysis is improved but not completely  resolved.  His dyspnea is improved.  Hemodynamically he is stable.  He  has currently reached maximum benefit of inpatient care.  He will be  discharged to home with follow-up as previously mentioned.      Zenia Resides, NP      Coralyn Helling, MD  Electronically Signed    PB/MEDQ  D:  04/20/2007  T:  04/20/2007  Job:  [id number]   cc:   Lynelle Smoke I. Patsi Sears, M.D.  Madolyn Frieze Jens Som, MD, [facility]

## 2011-01-21 NOTE — H&P (Signed)
NAME:  [name], [name]                ACCOUNT NO.:  [id number]   MEDICAL RECORD NO.:  [id number]          PATIENT TYPE:  INP   LOCATION:  5155                         FACILITY:  [facility]   PHYSICIAN:  Coralyn Helling, MD        DATE OF BIRTH:  08-11-1923   DATE OF ADMISSION:  04/15/2007  DATE OF DISCHARGE:                              HISTORY & PHYSICAL   OBJECTIVE CHIEF COMPLAINT:  Coughing up blood x1 week.   HISTORY OF PRESENT ILLNESS:  This is an 77 year old white male patient  of Dr. Craige Cotta, who had a history of bilateral pulmonary infiltrate with p-  ANKA positivity felt secondary to pauci-immune vasculitis, recurrent  hemoptysis, and obstructive sleep apnea.  Returns back to the office  today complaining of persistent hemoptysis.  Patient was recently seen  in the office 3 days ago  with increased cough with bright red blood and  shortness of breath with activity.  Chest x-ray at that time showed  slightly increased interstitial markings.  Patient's prednisone was  increased from 25 mg to 40 mg.  Patient returns back today.  He reports  his hemoptysis has slightly increased.  He continues to have some  shortness of breath with activity.  Patient denies any fever, purulent  sputum,  chest pain, palpitations, presyncopal or syncopal episodes,  bloody stools, leg swelling, orthopnea, or PND.  Patient is on nocturnal  oxygen at 2 L, but has been using 2 L with activity over the last  several days.   PAST MEDICAL HISTORY:  1. Renal cell carcinoma status post partial left nephrectomy.  2. Prostate cancer status post radiation therapy, on monthly hormone      injections.  3. Coronary artery disease status post CABG x4 vessels in 2002.  4. Complete heart block status post pacemaker in December of 2007.  5. Gastroesophageal reflux.  6. Hyperlipidemia.  7. Hypertension.   LABORATORIES:  Eye tumor status post enucleation.   CURRENT MEDICATIONS:  1. Lipitor 80 mg nightly.  2. Co-Q10 daily.  3. Cozaar 50 mg daily.  4. Multivitamin daily.  5. Vitamin C daily.  6. Vitamin E daily.  7. Folic acid daily .  8. Prednisone 40 mg daily.   DRUG ALLERGIES:  PENICILLIN AND ACE INHIBITORS CAUSE  A COUGH.   SOCIAL HISTORY:  Patient is widowed.  He is a former Technical sales engineer.  He has  3 grandchildren.  Is a never-smoker.  Drinks wine on occasion.  No drug  history.   FAMILY HISTORY:  1. Positive for colon cancer in his mother.  2. Heart disease in his father.  3. Negative for diabetes.   OBJECTIVE PHYSICAL EXAMINATION:  GENERAL:  Patient is a frail, elderly  male appearing in no acute distress.  VITAL SIGNS:  Temperature is 98.4.  Blood pressure 154/84.  Heart rate  is 81.  O2 saturation is 94% on room air.  Weight is at 180 pounds.  HEENT: Atraumatic, normocephalic.  Nasal mucosa is pale.  Nontender  sinus.  Conjunctivae not injected.  Right eye is reactive.  Left eye  orthotic.  NECK:  The neck is supple, without cervical adenopathy.  No JVD.  LUNGS:  The lung sounds reveal some bibasilar crackles.  CARDIAC:  S1, S2, without murmur, rub or gallop.  ABDOMEN:  Soft, with positive bowel sounds throughout all four  quadrants.  No palpable hepatosplenomegaly.  EXTREMITIES:  The extremities are warm, without any calf tenderness,  cyanosis, clubbing, or edema.   DATA:  Chest x-ray on April 12, 2007 showed coarsened mid and lower lung  pulmonary markings.   IMPRESSION/PLAN:  1. Persistent hemoptysis with underlying probable pauci-immune      vasculitis which has been refractory to outpatient therapy.      Patient will be admitted, started on intravenous antibiotics,      steroids.  A chest x-ray is pending.  Lab work, including a CBC,      CMET, sed rate, BMP, and coag levels are pending.  Patient will be      given mucolytics and a cough suppression regimen with tramadol as      needed, and will be started on gastrointestinal prophylaxis with      Protonix, due to steroid use.  2.  History of coronary artery disease and hyperlipidemia.  We will      continue on home medications, as prior to admission.      Rubye Oaks, NP      Coralyn Helling, MD  Electronically Signed    TP/MEDQ  D:  04/15/2007  T:  04/16/2007  Job:  [id number]

## 2011-01-21 NOTE — Discharge Summary (Signed)
NAMEYSIDRO, RAMSAY                ACCOUNT NO.:  [id number]   MEDICAL RECORD NO.:  [id number]          PATIENT TYPE:  INP   LOCATION:  1437                         [facility]ACILITY[facility]   PHYSICIAN:  Coralyn Helling, MD        DATE OF BIRTH:  Oct 26, 1922   DATE OF ADMISSION:  05/05/2007  DATE OF DISCHARGE:  05/13/2007                               DISCHARGE SUMMARY   DISCHARGE DIAGNOSES:  1. Acute on chronic respiratory failure in the setting of Pauci-immune      vasculitis in decompensated systolic heart failure.  2. Systolic heart failure.  3. Obstructive sleep apnea.   CONSULTATIONS:  Pricilla Riffle, MD, [facility], [facility].   LABORATORY DATA AND X-RAY FINDINGS:  On May 12, 2007, sodium was  134, potassium 4.8, chloride 101, CO2 28, glucose 93, BUN 49, creatinine  1.42.  White blood cell count 7.1, hemoglobin 13.1, hematocrit 37.1,  platelet count 172.  BNP on May 07, 2007, 121.   HISTORY OF PRESENT ILLNESS:  This is an 77 year old male patient  discharged from the hospital on August 12, after being treated for  pneumonia, heart failure and flare of his vasculitis.  He was seen by  Dr. Arvilla Meres, MD, on August 26, and felt to be volume  overloaded.  He had his Lasix increased.  He was noted to have a  decreased O2 saturation with exertion down to 85%.  He had some  reasonable improvement in symptoms after discharge initially on August  12, but approximately 5-6 days prior to admission, he was having  worsening of symptoms.  He had been tapered down on his dose of  prednisone from 40 a day, at the time of discharge, to 30 a day  approximately 5 days prior to admission.  He continued to have cough  with clear sputum.  He denied fever, chills or sweats.  He was getting  short of breath with minimal exertion.   PAST MEDICAL HISTORY:  1. CHF with ejection fraction of 40%.  2. Coronary artery disease, status post myocardial infarction and      bypass grafting in 2001.  3. Complete heart block, status post pacemaker implantation.  4. Pauci-immune vasculitis.  5. Pneumonia.  6. Obstructive sleep apnea.  7. Chronic respiratory failure.  8. Hypoxemia.  9. Cough secondary to ACE inhibitor.  10.Right carotid bruit.  11.Nonsustained VT.  12.Hypertension.  13.Hyperlipidemia.  14.Prostate cancer, status post radiation therapy on Lupron      injections.  15.Renal cell carcinoma, status post partial left nephrectomy.  16.GERD.  17.Left eye tumor.   HOSPITAL COURSE:  Problem 1.  ACUTE ON CHRONIC RESPIRATORY FAILURE:  This is in the setting of Pauci-immune vasculitis with fibrosis with  flare of his vasculitis complicated by decompensated left ventricular  heart failure.  Mr. Monje was admitted to the medical ward at [facility].  Therapy consisted of IV Solu-Medrol, IV Lasix and close  observation.  Additionally, he had cardiology consultation for  assistance with management of his left ventricular dysfunction.  Medication regimen has been changed, prednisone now  weaned to 40 mg  daily.  At the time of discharge, the patient is close to baseline  dyspnea status.  Mr. Burch will be discharged to home with close  pulmonary and cardiology followup.  Particularly, he has had changes in  his maintenance medications including increasing his Coreg from 6.25 mg  to 12.5 mg twice daily as well as decreasing his Cozaar to 25 mg daily.  He will continue daily diuresis with Lasix as well as daily potassium  supplementation as recommended by cardiology.  He has followup with Dr.  Craige Cotta on September 18, and will follow up with Dr. Jens Som in the next 2  weeks.  The [facility]iology team will arrange this and call Mr. Closson  at home.   Problem 2.  OBSTRUCTIVE SLEEP APNEA:  This was verified by  polysomnogram.  Plan for this is to initiate autotitration CPAP at home.  Dr. Craige Cotta is arranging this through our office.  Hopefully, this will  assist in chronic  respiratory failure and certainly assist also with  underlying systolic dysfunction.   Problem 3.  HISTORY OF COMPLETE HEART BLOCK:  Pacemaker is in place.  Cardiology is following new issues for this.   Problem 4.  HYPERLIPIDEMIA:  Plan for this is to continue daily Lipitor.   DIET:  No added salt.   ACTIVITY:  Increase as tolerated.   FOLLOW UP:  Follow up with Dr. Craige Cotta on September 18.  Dr. Jens Som in  the next 2 weeks.   DISCHARGE MEDICATIONS:  1. Oxygen 2 L at all times.  2. Prednisone 20 mg tablets, two tablets daily.  3. Coreg 12.5 mg tablet twice a day.  4. Cozaar 25 mg tablet daily.  5. Lasix 40 mg tablet daily.  6. K-Dur 20 mEq tablet two tablets daily.  7. Lipitor 80 mg before bed.  8. Pantoprazole 40 mg daily.  9. Co-Q 10 daily.  10.Vitamin C daily.  11.Multivitamin daily.  12.Lupron injection as directed.  13.Vitamin D daily.   PHYSICAL EXAMINATION:  VITAL SIGNS:  Temperature 97.9, heart rate 74,  respirations 20-22, blood pressure 144/87, saturations 98-100% on 2 L  nasal cannula.  GENERAL:  In no acute distress.  SKIN:  Warm and dry.  NECK:  No JVD.  CARDIAC:  Regular rate and rhythm.  LUNGS:  Bibasilar rales.  EXTREMITIES:  Without edema.  ABDOMEN:  Soft, nontender.   DISPOSITION:  Mr. Higinbotham has met maximum benefit from inpatient care.  He  will be discharged to home with close outpatient followup with both  cardiology and pulmonary services.      Zenia Resides, NP      Coralyn Helling, MD  Electronically Signed    PB/MEDQ  D:  05/13/2007  T:  05/13/2007  Job:  [id number]   cc:   Madolyn Frieze. Jens Som, MD, [facility]  [address]  [address]  [place]  [place] [address]

## 2011-01-21 NOTE — Assessment & Plan Note (Signed)
[facility]                             PULMONARY OFFICE NOTE   NAME:DAVISHezekiah, [name]                       MRN:          [id number]  DATE:05/27/2007                            DOB:          1923/05/07    PULMONARY VISIT   I saw [name] in followup for his pauci-immune vasculitis, pulmonary  fibrosis, hypoxemia, and obstructive sleep apnea.  He was discharged  from [facility] on September 4.  He said since then his  breathing has been relatively stable, although he still gets dyspneic  with minimal amounts of exertion.  He is currently on prednisone at 40  mg a day.  He is not having much as far as cough, sputum production,  hemoptysis, chest tightness, or wheezing.  He says he is not having much  as far as chest pain or leg swelling.  He is no longer feeling that  sensation like he has a strong pulse and a weak pulse intermittently, or  at least not as much as he was before.   CURRENT MEDICATIONS:  1. Prednisone 40 mg daily.  2. Protonix 40 mg daily.  3. Lasix 40 mg daily.  4. Cozaar 25 mg daily.  5. Lipitor 80 mg daily.  6. Potassium 40 mEq daily.  7. Vitamin D.  8. Coenzyme Q-10.   PHYSICAL EXAM:  He is 174 pounds, temperature 98, blood pressure 122/68,  heart rate 75, oxygen saturation 96% on 2L.  HEENT:  There is no sinus tenderness.  No oral lesions.  HEART:  S1, S2.  CHEST:  There were fine rales heard at the bases, but no wheezing.  ABDOMEN:  Soft and nontender.  EXTREMITIES:  No edema.   IMPRESSION:  1. Pauci-immune vasculitis with pulmonary fibrosis.  I will have him      decrease his prednisone to 30 mg a day and monitor his symptom      status.  We had again discussed the option of pursuing biopsy, but      he is somewhat reluctant to do this, and I am in agreement with      this.  2. Hypoxemia related to his fibrosis.  He is to continue on      supplemental oxygen for the time being.  3. Obstructive sleep apnea.   I will arrange for him to undergo an auto-      CPAP titration study for 2 weeks and, once I have a chance to      review his download, would make any further adjustments as needed.  4. Systolic heart failure.  He is due to have followup in the heart      failure clinic with Dr. Jens Som later this month.   I will follow up with him in 3 to 4 weeks.     Coralyn Helling, MD  Electronically Signed    VS/MedQ  DD: 05/27/2007  DT: 05/27/2007  Job #: [id number]   cc:   Madolyn Frieze. Jens Som, MD, [facility]

## 2011-01-21 NOTE — Discharge Summary (Signed)
NAME:  [name], [name]                ACCOUNT NO.:  [id number]   MEDICAL RECORD NO.:  [id number]          PATIENT TYPE:  INP   LOCATION:  1430                         FACILITY:  [facility]   PHYSICIAN:  Coralyn Helling, MD        DATE OF BIRTH:  [birth year]   DATE OF ADMISSION:  09/07/2007  DATE OF DISCHARGE:  09/13/2007                               DISCHARGE SUMMARY   DISCHARGE DIAGNOSES:  1. Acute dyspnea with a history of pulmonary vasculitis with fibrosis.  2. Hypertension with a history of congestive heart failure.  3. Gastroesophageal reflux disease.   HISTORY OF PRESENT ILLNESS:  [name] is a complex 77 year old white  male who developed acute dyspnea.  Of note, he has been on a slow  steroid taper and has been on 10 mg of prednisone for approximately five  weeks due to his history of pulmonary vasculitis.  He developed  increasing shortness of breath over 2-3 days, was seen by Dr. Craige Cotta, and  was subsequently admitted for further evaluation and treatment.  He does  have a history of coronary artery disease.   PAST MEDICAL HISTORY:  1. Coronary artery disease.  2. 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  3. Pneumonia.  4. Chronic hypoxemia.  5. ACE inhibitor-induced cough.  6. Right carotid bruit.  7. Nonsustained ventricular tachycardia.  8. Hypertension.  9. Hyperlipidemia.  10.Prostate cancer.  11.Renal cell carcinoma, status post left partial nephrectomy.  12.Left eye tumor, status post enucleation.   LAB DATA:  Hemoglobin 12.1, hematocrit 33.9, platelets 250, WBCs 7.2.  Sodium 141, potassium 3.8, chloride 108, CO2 25, BUN 30, creatinine 1.4,  glucose 151.  Troponin I is 0.02.  CK-MB is 2.4.  CK is 46.  Calcium  9.2, magnesium 2.2,  phosphorus 4.3.  Blood cultures were negative x2.  Sed rate was noted to be 19.  Cortisol level was 27.   Chest x-ray demonstrates chronic lung changes with chronic interstitial  disease, scarring, and no definite overlying pulmonary process.   PHYSICAL EXAMINATION:  VITAL SIGNS:  Blood pressure 148/86, heart rate  75, respirations 18, sats 93% on 2 liters.  Temperature is 97.4.   HOSPITAL COURSE BY DISCHARGE DIAGNOSES:  1. Increasing dyspnea:  He was admitted to [facility] and      started back on high dose steroids.  He reached maximal hospital      benefit by September 13, 2007.  His Solu-Medrol has been stopped.  He      has been transitioned to prednisone at 60 mg daily.  He will be on      a slow taper down to 40 mg a day until he follows up with Dr. Craige Cotta      at a pre-arranged time in January.   1. Hypotension:  With his  history of congestive heart failure, he had      a cardiology consult called in with Dr. [name] Kill.  His cardiologist,      of note, is Dr. Jens Som.  Note that his Cozaar has been stopped,      and his Coreg is at 12.5 mg daily.  He will follow up with      cardiology in the future.   1. Gastroesophageal reflux disease:  He had been on Protonix in the      past.  He will go back on his omeprazole.  He is asked if he can      use Prilosec over the counter.  He has been told that he can use      that.   DISCHARGE MEDICATIONS:  1. Home O2 at 2 liters 24 hours a day.  2. Coreg 12.5 mg 1 tab 2 times a day.  3. Lasix 40 mg daily.  4. Omeprazole 40 mg daily.  5. Potassium 40 mEq daily.  6. Vitamins, as before.  7. Prednisone, unknown taper, 60 mg x4 days, 50 mg x4 days, then 40 mg      daily until he is followed up by Dr. Craige Cotta.  8. Avelox 400 mg 1 daily until gone.  He will have a total of 10 days      of antimicrobial therapy.   DIET:  Heart-healthy with no addition of sweets or salt.   He has a follow-up appointment with Dr. Craige Cotta on January 12th at  10:40  a.m.   DISPOSITION/CONDITION AT DISCHARGE:  Improved.      Devra Dopp, MSN, ACNP      Coralyn Helling, MD  Electronically Signed    SM/MEDQ  D:  09/13/2007  T:  09/13/2007  Job:  [id number]

## 2011-01-21 NOTE — Assessment & Plan Note (Signed)
[facility]                            CARDIOLOGY OFFICE NOTE   NAME:[name], [name]                       MRN:          [id number]  DATE:03/08/2008                            DOB:          Mar 03, 1923    [name] is a pleasant 77 year old gentleman who has a history of  coronary disease status post coronary bypass and graft, ischemic  cardiomyopathy, congestive heart failure, history of pacemaker placement  and pulmonary fibrosis/vasculitis.  Since I last saw him he continues to  have significant dyspnea on exertion.  He feels that it is worsening.  There is no orthopnea or PND, but there is worsening pedal edema.  He  occasionally feels a tightness when he eats extra food, but otherwise he  has not had chest pain.  There is no syncope.  He apparently has  developed another vertebral fracture and we were asked to see him today  secondary to a preop evaluation prior to kyphoplasty.  This apparently  could require a general anesthesia.   MEDICATIONS:  1. Vitamin E.  2. Cozaar 25 mg p.o. daily.  3. Bilberry routine.  4. Prednisone 10 mg p.o. daily.  5. Lasix 40 mg p.o. b.i.d.  6. vitamin C.  7. Ocuvite.  8. Calcium.  9. Vitamin D.  10.Actonel.  11.Coreg 12.5 mg p.o. b.i.d.  12.Coenzyme Q.  13.Lipitor 80 mg p.o. daily.  14.Potassium 40 mEq  p.o. daily.   PHYSICAL EXAMINATION:  VITALS:  Blood pressure of 116/74 and his pulse  is 75.  He weighs 185 pounds which compares to 180 on Jan 24, 2008.  HEENT:  Significant for previous left eye enucleation.  NECK:  Supple.  CHEST:  Shows crackles at the bases bilaterally.  CARDIOVASCULAR:  Regular rate.  ABDOMEN:  Shows no tenderness.  EXTREMITIES:  Show 2+ edema.   Electrocardiogram shows atrial pacing.  There is left ventricular  hypertrophy with QRS widening.  A prior septal infarct cannot be  excluded.  There is left axis deviation.   DIAGNOSES:  1. Preoperative evaluation prior to kyphoplasty  - Given Mr. Ocain'      age, pulmonary fibrosis, ischemic cardiomyopathy, coronary disease      and other medical problems, I think he will be high risk for any      procedure.  If possible this should be managed conservatively.  If      he does require surgery and then I have instructed him that he will      be high risk, but we will be happy to follow him as needed during      the hospitalization.  2. Acute on chronic combined systolic and diastolic heart failure -      Mr. Basaldua continues to appear to be volume overload.  I will      increase his Lasix to 80 mg p.o. b.i.d. and his potassium to 40 mEq      in the morning and then 20 in the evening.  We will check a BMET      that a BMP on July 6.  We  need to follow him closely for any renal      insufficiency, as he does have a history of this.  I think his      congestive heart failure is most likely related to both systolic      and diastolic failure.  3. Dyspnea - I think it is a combination of congestive heart failure      superimposed on his pulmonary fibrosis.  4. Ischemic cardiomyopathy.  Continue his Coreg and low-dose Cozaar.      He is not on an aspirin as he does have a history of hemoptysis      with this given his pulmonary fibrosis and vasculitis.  5. Coronary artery disease - As per above.  6. Status post pacemaker placement secondary to complete heart block.  7. Pulmonary fibrosis/vasculitis - He will continue on steroids and      this is being managed by Pulmonary.  8. History of ACE inhibitor-induced cough.  9. History of nonsustained ventricular tachycardia.  10.Mild cerebrovascular disease.  11.Hypertension - Blood pressure under control.  12.Hyperlipidemia - Continue on his Lipitor.  13.Renal sufficiency.   We will see me back in 8 weeks.      Madolyn Frieze Jens Som, MD, [facility]  Electronically Signed     Madolyn Frieze. Jens Som, MD, [facility]  Electronically Signed   BSC/MedQ  DD: 03/08/2008  DT: 03/09/2008  Job #:  [id number]

## 2011-01-21 NOTE — H&P (Signed)
NAMESHANNA, STRENGTH                ACCOUNT NO.:  [id number]   MEDICAL RECORD NO.:  [id number]          PATIENT TYPE:  INP   LOCATION:  0108                         FACILITY:  [facility]   PHYSICIAN:  Coralyn Helling, MD        DATE OF BIRTH:  [birth year]   DATE OF ADMISSION:  09/07/2007  DATE OF DISCHARGE:                              HISTORY & PHYSICAL   ADMITTING DIAGNOSIS:  Progressive dyspnea.   HISTORY OF PRESENT ILLNESS:  Mr. Weigold is an 77 year old male who has a  complicated medical history of pulmonary fibrosis in the setting of  pauci-immune vasculitis.  He has been chronically on prednisone.  I have  been working to taper him off of his prednisone over the last several  weeks.  He had gotten down to a dose of 10 mg per day.  He says that  over the last 2-3 weeks he had been having worsening dyspnea associated  with a cough with pinkish-colored sputum.  He has also been having some  discomfort with his breathing where he feels like when he takes a deep  breath he has some discomfort.  He has also been feeling dizzy over the  last several days.  He checked his blood pressure at home around  Christmas and said that it was as low as 86 systolic blood pressure.  He  has not been having any nausea or diarrhea.  He has been having swelling  in his hands, but he denies swelling in his legs.  He has not had any  chills or sweats.  He has not had any recent change in his medications  besides dose of his prednisone.   PAST MEDICAL HISTORY:  1. Coronary artery disease status post myocardial infarction status      post coronary artery bypass grafting in 2001.  2. Congestive heart failure with an ejection fraction of 30-35% with      diffuse hypokinesia on echocardiogram from August 2008.  3. Complete heart block status post pacemaker insertion.  4. Pauci-immune vasculitis with pulmonary fibrosis.  5. Pneumonia.  6. Chronic hypoxemia.  7. ACE inhibitor induced cough.  8. Right carotid  bruit.  9. Nonsustained ventricular tachycardia.  10.Hypertension.  11.Hyperlipidemia.  12.Prostate cancer.  13.Renal cell carcinoma status post left partial nephrectomy.  14.Left eye tumor status post enucleation.   CURRENT MEDICATIONS:  1. Omeprazole.  2. Furosemide 40 mg daily.  3. Prednisone 10 mg daily.  4. Cozaar 25 mg daily.  5. Lipitor 80 mg daily.  6. Potassium 40 mEq daily.  7. Vitamin D.  8. Coenzyme Q10.  9. Carvedilol 25 mg b.i.d.  10.Vitamin C.  11.Vitamin E.   ALLERGIES:  PENICILLIN, VASOTEC.   FAMILY HISTORY:  Significant for his mother who has colon cancer and his  father who had heart disease.   SOCIAL HISTORY:  He is widowed.  He is a retired Technical sales engineer.  There is no  history of alcohol or tobacco use.   REVIEW OF SYSTEMS:  Unremarkable except for stated above.   PHYSICAL EXAMINATION:  VITAL SIGNS:  He is seen  in my office.  Weight is  189 pounds, temperature 97.7, oxygen saturation 93% on 2 liters, heart  rate 52, blood pressure 98/40.  HEENT:  There is no sinus tenderness.  There is no nasal discharge.  There are no oral lesions.  NECK:  There is no lymphadenopathy.  No jugular venous distention, no  thyromegaly.  HEART:  S1, S2 regular.  CHEST:  He has fine crackles heard at the bases.  There was no wheezing.  ABDOMEN:  Was soft, nontender.  Positive bowel sounds.  EXTREMITIES:  There is no leg edema or cyanosis.  NEUROLOGIC:  He has 5/5 strength.   DIAGNOSTIC STUDIES:  Chest x-ray and laboratory results are pending.   IMPRESSION:  1. Acute worsening of his dyspnea in the setting of pauci-immune      vasculitis with pulmonary fibrosis and hypoxemia.  He has clearly      deteriorated since his last visit with me, and I would recommend      that he be admitted to the hospital.  I will admit him to a      telemetry unit.  I will start him on systemic corticosteroids with      Solu-Medrol, and start him on antibiotics with Avelox.  I would       also check his blood cultures and sputum cultures, and follow up on      his chest x-ray and his laboratory tests.  Depending upon his      responses and the results of the above tests, he may need to have      further imaging studies with a CT scan of his chest.  Additionally      we may need to consider starting additional immunosuppressant      agent.  2. Coronary artery disease with congestive heart failure.  I will ask      for cardiology to evaluate him also to assist with adjusting his      medical regimen as he has been having problems with hypotension and      bradycardia.  3. Hypertension.  He is currently hypotensive, and I will hold his      cardiac medications until he is further evaluated by cardiology.  4. History of renal cell carcinoma and prostate carcinoma.  He is      followed on an outpatient basis by Dr. Jethro Bolus.  5. Reflux disease.  I will continue him on Protonix.  Of note is that      he was recently switched to omeprazole, but did not seem to do as      well with this compared to Protonix.  6. Depending upon his response to the above interventions, further      discussions with regards to goals of care may need to be addressed.      Coralyn Helling, MD  Electronically Signed    VS/MEDQ  D:  09/07/2007  T:  09/07/2007  Job:  [id number]

## 2011-01-21 NOTE — Assessment & Plan Note (Signed)
[facility]                             PULMONARY OFFICE NOTE   NAME:Cinque, KENI ELISON                       MRN:          [id number]  DATE:03/04/2007                            DOB:          12-06-1922    HISTORY OF PRESENT ILLNESS:  The patient is an86 year old white male  patient of Dr.  Evlyn Courier who has a known history of pauci-immune  vasculitis and COPD who presents today for one week history of increased  cough and congestion with some episodes of pink tinged sputum. The  patient was recently admitted to the hospital in April of this year with  a flare of his vasculitis with pneumonia, hemoptysis and started on high  dose steroid taper. He started at 60 mg and has now tapered down over  the last two months at 5 mg every other day. The patient had been doing  well up until the last week. The patient denies any chest pain,  shortness of breath, dizziness, extremity weakness.   PAST MEDICAL HISTORY:  Reviewed.   CURRENT MEDICATIONS:  Reviewed.   PHYSICAL EXAMINATION:  The patient is pleasant male in no acute  distress. He is afebrile with stable vital signs. O2 saturation is 94%  on room air.  HEENT: Unremarkable.  NECK: Supple without cervical adenopathy. No JVD.  LUNGS: Lung sounds reveal a few expiratory wheezes.  CARDIAC: Regular rate.  ABDOMEN: Soft and nontender.  EXTREMITIES: Warm without any edema.   IMPRESSION/PLAN:  Acute chronic obstructive pulmonary disease flare in a  patient with underlying pauci-immune vasculitis. The patient is to begin  Levaquin 750 mg daily x7 days and Mucinex DM twice daily for cough. Will  increase prednisone up to 20 mg for two days and then decrease to 10 mg  for two days and then 5 mg daily and hold. He has a followup appointment  in two weeks with Dr.  Craige Cotta or sooner if needed. The patient is to  contact the office for sooner followup if symptoms do not improve or  worsen.      Rubye Oaks, NP  Electronically Signed      Coralyn Helling, MD  Electronically Signed   TP/MedQ  DD: 03/04/2007  DT: 03/04/2007  Job #: [id number]

## 2011-01-21 NOTE — Assessment & Plan Note (Signed)
[facility]                            CARDIOLOGY OFFICE NOTE   NAME:[name], [name]                       MRN:          [id number]  DATE:10/25/2008                            DOB:          18-Dec-1922    [name] is a pleasant gentleman who has a history of coronary artery  disease status post coronary bypass and graft, ischemic cardiomyopathy,  combined systolic/diastolic heart failure, and history of pacemaker  placement.  He also has pulmonary fibrosis and vasculitis.  His most  recent Myoview was performed on April 27, 2007.  At that time, the  ejection fraction was 40%.  There was a prior inferior infarct and  trivial peri-infarct ischemia.  The patient has had problems with edema  and is on Lasix for this particular issue.  Since I last saw him, he has  significant dyspnea on exertion, which has been a longstanding problem.  He states when he walks, his saturations will drop from 98% to 92%.  He  is on 4 L of home oxygen.  There is no orthopnea or PND, and his pedal  edema is well controlled.  He has not had chest pain recently nor is  there any syncope.   MEDICATIONS AT PRESENT:  1. Vitamin E.  2. Potassium 40 mEq in the morning and 20 in the evening.  3. Prednisone 5 mg p.o. every other day.  4. Vitamin D and calcium.  5. Multivitamin.  6. Lasix 120 mg p.o. daily b.i.d.  7. Lipitor 80 mg p.o. daily.  8. Coenzyme Q.  9. Coreg 12.5 mg p.o. b.i.d.  10.Actonel.  11.Calcium.  12.Vitamin C.   PHYSICAL EXAMINATION:  VITAL SIGNS:  Blood pressure of 134/70 and his  pulse is 75.  He weighs 183 pounds.  HEENT:  Normal.  NECK:  Supple.  CHEST:  Shows dry crackles at the bases.  CARDIOVASCULAR:  Regular rate and rhythm.  ABDOMEN:  No tenderness.  EXTREMITIES:  Trace edema.   His electrocardiogram shows atrial pacing.  There is left ventricular  hypertrophy with QRS widening.  There is left axis deviation.  There are  no ST changes  noted.   DIAGNOSES:  1. Edema - [name]' edema is doing well on the present dose of      diuretic.  We will continue with his Lasix and potassium.  I will      check a BMET today to follow his past renal function as well as a      BNP.  2. Coronary artery disease status post coronary bypass and graft - he      will continue on his beta-blocker and statin.  He is not on an      aspirin as he has had problems with hemoptysis in the past.  We      also discontinue his ARB as his blood pressure systolic was in the      75 range at times per his wife's report.  3. History of acute on chronic combined systolic and diastolic heart      failure - he  will continue his present dose of Lasix.  4. Hyperlipidemia - he will continue on his statin and we will check      lipids and liver and adjust as indicated.  5. Pulmonary fibrosis.  6. History of pacemaker placement.  7. History of ACE inhibitor-induced cough.  8. History of mild cerebrovascular disease - he will continue on his      statin.  9. Hypertension - his blood pressure is adequately controlled on his      present medications.  10.History of renal sufficiency.   We will see him back in 4 months.     Madolyn Frieze Jens Som, MD, [facility]  Electronically Signed    BSC/MedQ  DD: 10/25/2008  DT: 10/25/2008  Job #: [id number]

## 2011-01-21 NOTE — Assessment & Plan Note (Signed)
[facility]                            CARDIOLOGY OFFICE NOTE   NAME:Snellings, RAYNOLD BLANKENBAKER                       MRN:          [id number]  DATE:08/17/2007                            DOB:          08-20-1923    Mr. Mangiaracina is a very pleasant 77 year old gentleman who has a history of  coronary artery disease status post coronary bypassing graft, status  post permanent pacemaker placement and ischemic cardiomyopathy.  He also  has pulmonary fibrosis and vasculitis.  Since I last saw him he has had  progressive increasing dyspnea on exertion that has been attributed to  his lung disease.  There is no orthopnea, PND, pedal edema,  palpitations, presyncope, syncope or chest pain.  Note, he did have a  Myoview on April 27, 2007, that showed a prior inferior infarct with a  question of trivial periinfarct ischemia.  The ejection fraction was  40%.   MEDICATIONS:  1. Lasix 40 mg p.o. daily.  2. Cozaar 25 mg p.o. daily.  3. Lipitor 80 mg p.o. daily.  4. Vitamin D.  5. Coenzyme Q.  6. Multivitamin with Vitamin C.  7. Vitamin E.  8. Omeprazole.  9. Prednisone 10 mg p.o. daily.  10.Carvedilol 25 mg p.o. b.i.d.  11.Potassium.   PHYSICAL EXAM:  Today, shows a blood pressure of 143/72 and his pulse is  88.  He weighs 186 pounds.  HEENT:  Normal.  NECK:  Supple.  CHEST:  Shows mild dry crackles at the bases.  CARDIOVASCULAR EXAM:  Regular rate and rhythm.  ABDOMINAL EXAM:  Shows no tenderness.  EXTREMITIES:  Show no edema.   His electrocardiogram shows atrial pacing at a rate of 75.  There is  left ventricular hypertrophy with QRS widening.  There is left axis  deviation.   DIAGNOSES:  1. Dyspnea - This appears to be predominantly related to his pulmonary      fibrosis and vasculitis.  Will check a BNP today to make sure there      is no excess fluid, although he is euvolemic on exam.  2. Ischemic cardiomyopathy - He will continue on his beta-blocker as   well as his angiotensin-receptor blocker.  3. History of coronary disease - He will continue on his statin.  He      is off of his aspirin due to his recurrent hemoptysis.  4. Status post pacemaker placement secondary to complete heart block.  5. Pulmonary fibrosis/pulmonary vasculitis - per Pulmonary.  6. History of ACE inhibitor induced cough.  7. History of nonsustained ventricular tachycardia.  8. Cerebrovascular disease - He had followup carotid Dopplers today      and we will await those results.  9. Hypertension - His blood pressure appears to be adequately      controlled.  10.Hyperlipidemia - He will continue on his statin, I will check      lipids and liver today.  I will also check a BMET given his      angiotensin-receptor blocker and diuretic use.  11.History of mild renal insufficiency.   We will see him back  in 4 months.     Madolyn Frieze Jens Som, MD, [facility]  Electronically Signed    BSC/MedQ  DD: 08/17/2007  DT: 08/17/2007  Job #: [id number]

## 2011-01-21 NOTE — Consult Note (Signed)
NAME:  [name], [name]                ACCOUNT NO.:  [id number]   MEDICAL RECORD NO.:  [id number]          PATIENT TYPE:  INP   LOCATION:  1430                         FACILITY:  [facility]   PHYSICIAN:  [name] Morton. [name] Kill, MD, FACCDATE OF BIRTH:  1923/01/08   DATE OF CONSULTATION:  DATE OF DISCHARGE:                                 CONSULTATION   CHIEF COMPLAINT:  Shortness of breath.   HISTORY OF PRESENT ILLNESS:  [name] is an 77 year old gentleman who  has developed acute dyspnea.  The patient has been on a steroid taper  recently and down to 10 mg with a history of pulmonary vasculitis.  He  developed increasing shortness of breath over the past few days, was  seen by Dr. Craige Cotta, and subsequently admitted for further evaluation.  He  was noted to have also intermittent drop in his blood pressure and as a  result cardiology consult was obtained.  The patient has a history of  prior coronary artery bypass graft surgery in 2001, and known LV  dysfunction with an ejection fraction of 35%, mild AI and known  diastolic dysfunction.  He has been on a steroid taper with pulmonary  vasculitis and fibrosis.  Myoview in August 2008 revealed an ejection  fraction of 40% with old inferior wall infarction.  He denies ongoing  chest pain.  He now feels better since admission earlier today and after  a dose of high-dose steroids.   PAST MEDICAL HISTORY:  The past medical history is complicated.  He  underwent coronary artery bypass graft surgery x4 with a left internal  mammary to the LAD, vein graft to the circumflex, vein graft to the  diagonal and vein graft to the PDA.  His most recent Myoview was in 2008  with an ejection fraction of 40%, and evidence of an inferior infarct.  He has a history of complete heart block requiring Medtronic II chamber  pacemaker in 2007.  He has had pulmonary fibrosis and vasculitis  diagnosed earlier this past year and he has been on several steroid  tapers.  He has  mild chronic renal insufficiency and a history of  nonsustained ventricular tachycardia.  He has a history of bilateral  carotid bruits, prostate cancer, renal cell cancer in the past with  prior left nephrectomy, gastroesophageal reflux, and history of left eye  tumor.   SOCIAL HISTORY:  The patient is accompanied by his daughter.   His mother died of colon cancer and father had a history of CAD.   The patient denies diarrhea, sweats.  He has not had lower extremity  edema or significant chest pain.  He has not vomited bladder or had  blood in his stool.  He has had intermittent hemoptysis as noted  previously.   ALLERGIES:  INCLUDE AN ACE INHIBITOR WHICH CAUSES COUGH AND A PENICILLIN  ALLERGY.   MOST RECENT MEDICATIONS:  1. Carvedilol 25 mg p.o. b.i.d.  2. Cozaar 25 mg daily.  3. Lipitor 80 mg daily.  4. Vitamin D daily.  5. Prednisone 10.  6. Lasix 40 mg daily.  7.  Potassium replacement.   PHYSICAL EXAMINATION:  GENERAL:  He is alert and oriented.  He is  sitting in the bed with no distress.  VITAL SIGNS:  Blood pressure is 130/70.  The pulse is 74, respirations  18 and the temperature 98.1.  NECK:  The jugular veins are not clearly distended.  There are soft  bilateral carotid bruits.  HEENT:  Examination is unremarkable.  LUNGS:  The lungs are relatively clear, with slightly coarse rhonchi,  but no definite rales in the bases or in the upper lobes.  CARDIAC:  The cardiac rhythm is regular.  The PMI is not clearly  displaced.  There is a normal first and second heart sound.  I do not  appreciate aortic regurgitation.  ABDOMEN:  Examination of the abdomen reveals no obvious masses.  EXTREMITIES:  Reveal no edema.  NEUROLOGIC:  Exam is nonfocal.   LABORATORY DATA:  Includes a hemoglobin of 12.4, hematocrit of 34.9,  platelet count of 241,000, and a white count of 8500.  The BNP is 228.  The sed rate 19, troponin 0.05,  a CPK of 62 with a CK-MB of 2.3.  The  magnesium  is 2.2.  Phosphorus is 4.4.   IMPRESSION:  The patient's symptoms likely are from recurrent pulmonary  vasculitis precipitated by a steroid taper, although a mild component of  heart failure cannot be excluded.  Importantly, his BNP is slightly  higher then it was back in the fall.  He does have well known left  ventricular dysfunction.  The blood pressure is lower for unclear  reasons.  With his pulmonary vasculitis he is not a good candidate for  anticoagulation.  We will likely go ahead and check a D-dimer.  We are  going to reduce his Carvedilol dose because of his lower blood pressure  although I would likely continue him on Cozaar and I suspect he will  need to continue to get his diuretics.  Serial enzymes will be obtained.  He will need continued followup.      [name] Morton. [name] Kill, MD, [facility]  Electronically Signed     TDS/MEDQ  D:  09/07/2007  T:  09/08/2007  Job:  [id number]   cc:   Coralyn Helling, MD  [address]  [place], [place] [address]   Madolyn Frieze. Jens Som, MD, [facility]  [address]  [address]  [place]  [place] [address]

## 2011-01-21 NOTE — Consult Note (Signed)
NAME:  [name], [name]                ACCOUNT NO.:  [id number]   MEDICAL RECORD NO.:  [id number]          PATIENT TYPE:  OBV   LOCATION:  4735                         FACILITY:  [facility]   PHYSICIAN:  Charlcie Cradle. Delford Field, MD, FCCPDATE OF BIRTH:  08-[birth year]   DATE OF CONSULTATION:  01/01/2008  DATE OF DISCHARGE:                                 CONSULTATION   REASON FOR CONSULTATION:  Dyspnea.   BRIEF HISTORY:  Mr. Younge is a well-known patient to the [facility]  Pulmonary Service followed by Dr. Coralyn Helling in the outpatient setting  for chronic respiratory failure in the setting of pulmonary fibrosis and  pauci-immune vasculitis, further complicated by obstructive sleep apnea  and ischemic cardiomyopathy with underlying ejection fraction somewhere  in the range of 35% to 40%.  He was seen last by Dr. Coralyn Helling as an  outpatient preoperative candidate for kyphoplasty on December 27, 2007.  He  was deemed a high risk for general anesthesia; however, it was not felt  that his pulmonary condition would preclude him from having the  procedure, and therefore he was granted pulmonary clearance with as long  as the patient was aware of his high risk.  He underwent successful  kyphoplasty on December 30, 2007, which included induction of general  anesthesia, and placement on mechanical ventilator.  He was successfully  extubated postoperatively with no postoperative complications and  discharged to home following kyphoplasty on December 31, 2007.  Mr. Stidd  reports today, on January 01, 2008, with chief complaint of increased  lower extremity edema as well as awakening this morning short of breath.  At baseline, he reports easy onset of exertional dyspnea, which is  difficult to qualify in the setting of significant back pain, which has  been a complicating factor all along.  He therefore was seen in  evaluation by the emergency room physician.  Initial therapy included IV  nitroglycerin and IV diuresis as well  as supplemental oxygen increased  from 2 L which is his home setting to 4 L.  Upon time of pulmonary  evaluation, Mr. Morini reports his dyspnea has improved greatly, and he  is resting comfortably in bed, able to speak complete sentences. The  Pulmonary Service was asked to follow, given his multiple comorbidities,  and complicated pulmonary history.   PAST MEDICAL HISTORY:  Congestive heart failure, ischemic cardiomyopathy  with an EF of 35% to 40%, chronic respiratory failure, obstructive sleep  apnea, post inflammatory pulmonary fibrosis, skin tags, bursitis of the  right elbow, gout, thyroid cyst, prostate cancer, history of chickenpox,  history of renal insufficiency, history of hyperlipidemia, history of  GERD, and a history of hypertension.   PAST SURGICAL HISTORY:  Placement of permanent pacemaker, renal cell  carcinoma, and bypass surgery x4 vessels.   FAMILY HISTORY:  Mother died of colon cancer.  Father had coronary  artery disease.   SOCIAL HISTORY:  He is a retired Technical sales engineer, he is a widower.  Followed  closely by his daughter.  Upon time of evaluation, he does have  significant exertional dyspnea, at baseline.  REVIEW OF SYSTEMS:  Per HPI.  In addition, Mr. Burbano denies chest  discomfort, cough, fever, chills, nausea, vomiting, sick exposure,  wheezing, or any other pertinent positives mentioned other than what was  mentioned in the history of present illness.  His last recorded weight  in the office was 186 pounds.   CURRENT MEDICATION:  Includes:  1. Lasix 40 mg p.o. daily.  2. Potassium chloride 40 mEq p.o. daily.  3. Lipitor 20 mg daily.  4. Coenzyme CQ.  5. Coreg 12.5 mg p.o. b.i.d.  6. Prednisone 20 mg p.o. daily.  7. Actonel 35 mg weekly.  8. Prilosec daily.  9. Vitamin D.  10.Calcium.  11.Ocuvite.  12.Vitamin C on a daily basis.  13.Additionally, he is on BiPAP on setting of 9/5 in the outpatient      setting for his CPAP.   ALLERGIES:  Include  PENICILLIN and ZETIA.   PHYSICAL EXAMINATION:  VITAL SIGNS:  Heart rate 77, respirations 20,  blood pressure 100/58, saturations 98% on 4 L via nasal cannula.  GENERAL:  This is a pleasant gentleman, currently in no acute distress,  sitting upright in bed and able to speak complete sentences.  HEENT:  No JVD.  On examination, mucous membranes are moist, no  adenopathy.  PULMONARY:  Notable for no wheeze, he does have bibasilar rales.  Chest  x-ray notable for cardiomegaly, and mild bilateral pulmonary edema.  CARDIAC:  Regular rate and rhythm.  EXTREMITIES:  Notable for swollen lower extremities with 2+ to 3+ pedal  edema.  They are warm to palpation, with 2+ pulses.  ABDOMEN:  Soft and nontender without organomegaly.  GU:  He is voiding spontaneously with large volumes of clear yellow  urine.  NEUROLOGICAL: Grossly intact.   LABORATORY DATA:  BNP 196, sodium 140, potassium 3.9, chloride 105, CO2  of 28, creatinine 1.6, glucose 98, hemoglobin 13.7, hematocrit 39, white  blood cell count 9.5, platelet count is 183,000.   IMPRESSION AND PLAN:  Acute on chronic dyspnea in the setting of chronic  respiratory failure, secondary to volume overload; superimposed on  underlying pulmonary fibrosis; and ischemic cardiomyopathy, further  complicated by obstructive sleep apnea.  His primary complaint upon time  of admission is increased edema, therefore our recommendations are that  of, agree with current plan of care which consists of focus on diuresis  and volume removal.  Additionally, he would continue his baseline  prednisone at 20 mg daily for his pauci-immune vasculitis, as well as  continue further recommendations per Cardiology evaluation.  Additionally, it would be important to continue Mr. Gelles' bedtime CPAP,  which is typically set at an IPAP of 9 and an EPAP of 5.  We  would continue supplemental oxygen.  Additionally, it would be  worthwhile to evaluate further potential  postprocedure acute cardiac  event, given his history of ischemic cardiomyopathy.  We will defer this  to Cardiology Service.      Zenia Resides, NP      Charlcie Cradle. Delford Field, MD, [facility]  Electronically Signed    PB/MEDQ  D:  01/01/2008  T:  01/01/2008  Job:  [id number]   cc:   Madolyn Frieze. Jens Som, MD, [facility]  Coralyn Helling, MD

## 2011-01-21 NOTE — Discharge Summary (Signed)
NAME:  [name], [name]                ACCOUNT NO.:  [id number]   MEDICAL RECORD NO.:  [id number]          PATIENT TYPE:  OBV   LOCATION:  4735                         FACILITY:  [facility]   PHYSICIAN:  Rosalyn Gess. Norins, MD  DATE OF BIRTH:  Sep 05, 1923   DATE OF ADMISSION:  01/01/2008  DATE OF DISCHARGE:  01/02/2008                               DISCHARGE SUMMARY   24-HOUR EVAL   ADMITTING DIAGNOSIS:  Pulmonary edema/fluid overload.   DISCHARGE DIAGNOSIS:  Pulmonary edema/fluid overload.   HISTORY OF PRESENT ILLNESS:  Mr. Totherow is an 77 year old Caucasian  gentleman with multiple medical problems including pulmonary fibrosis,  vasculitis, known cardiomyopathy with ejection fraction of 30% to 35%  and known CAD.  The patient has recently had a full evaluations on December 22, 2007, and December 23, 2007, by Dr. Olga Millers of cardiology and  Dr. Coralyn Helling for pulmonary in preparation for kyphoplasty under  general anesthesia.  This was performed on December 30, 2007.  The  procedure was uneventful, and the patient was able to be extubated  easily following this procedure and was discharged home on the following  day, December 31, 2007.   The day prior and the day of surgery, he had increased doses of  prednisone of 40 mg daily.  Prior to surgery it is important to note  that the patient had noticed increased peripheral edema that was treated  with increased oral Lasix for several days.  Beginning on December 31, 2007, in p.m., the patient had increased peripheral edema and mild  increased shortness of breath.  He was brought to the emergency  department on the day of admission by his family because of increased  shortness of breath and swelling.  At presentation per family, his  initial O2 sat was 86% on 2 liters, although initial sat documented in  the ER record was 94%.  The emergency department MD felt the patient had  pulmonary edema, started IV Lasix, started IV nitro drip, and called the  internal medicine to admit.   At the time of admission, the patient appeared comfortable, in no acute  distress.  O2 sats were 94% to 98% on 2 liters of O2.  He denied any  chest pain or chest discomfort.  He had no respiratory distress or  increased work of breathing.  He was subsequently admitted for 24-hour  eval.   PAST MEDICAL HISTORY:  Very well documented in prior note and hospital  records.   HOSPITAL COURSE:  The patient was seen in consultation by both  cardiology and pulmonary service given his underlying disease in their  close followup.  They agreed with the initial regimen of IV Lasix at 40  mg q.8 h. for 2-3 doses.  He was maintained on his other medications  without change.   On this regimen overnight, the patient had a very good diuresis.  He had  improvement in his respiratory function with decreased shortness of  breath.  He had decreased peripheral edema.  With the patient having  diuresed successfully, being comfortable, it was felt he  was stable for  discharge home.  Of note, the patient did have a bleeding episode of  chest pressure.  EKG was performed revealing electronic atrial  pacemaker, left anterior fascicular block, no signs of acute changes.   DISCHARGE EXAMINATION:  VITAL SIGNS:  Temperature was afebrile, blood  pressure 143/77, heart rate was 70, respirations were 18.  GENERAL APPEARANCE:  A pleasant gentleman looking younger than his  stated chronologic age.  He is in no distress.  CHEST:  The patient is moving air well.  Very minimal bibasilar rales.  CARDIOVASCULAR:  2+ radial pulses.  Precordium was quiet.  He had a  regular rate and rhythm.  EXTREMITIES: The patient had 2+ pedal edema, worse on the right than the  left with significant diminished edema above the ankle.   LABORATORY:  Metabolic panel January 02, 2008, with a sodium 140,  potassium 3.5, chloride 101, CO2 of 31, BUN of 27, creatinine 1.4,  glucose was 93.  The patient did have a  CBC on admission which was  unremarkable with a hemoglobin of 13.7 g and white count of 9500.  The  patient had series of cardiac enzymes with CKs of 32, 33, 35; troponin I  of 0.01, 0.01, and 0.02.  The patient had a BNP at admission of 196.  BNP on the morning of discharge was 86.  The patient's chest x-ray at  time of admission was read out by the radiologist as showing no acute  findings except for known chronic cardiac enlargement without evidence  of heart failure.   DISPOSITION:  The patient will be discharged home.   He will continue on all of his home medications including;  1. Cozaar 25 mg daily.  2. Lipitor 80 mg q.p.m.  3. Prednisone 20 mg daily.  4. Lasix 40 mg daily.  5. Coreg 12.5 mg b.i.d.  6. Potassium 40 mEq daily.  7. Vitamin C daily.  8. Calcium 1200 mg daily.  9. Vitamin D 1200 international units daily.  10.Ocuvite tabs.  11.CQ10.  12.He will continue to use home oxygen.   DISPOSITION:  The patient is discharged to home.  He is already  scheduled to see Dr. Craige Cotta in followup this coming Friday, Jan 07, 2008.  The patient will call to schedule his own point with Dr. Olga Millers  for followup.  He will see Dr. Clent Ridges on an as needed basis.   The patient's condition at time of discharge dictation is stable and  improved.      Rosalyn Gess Norins, MD  Electronically Signed     MEN/MEDQ  D:  01/02/2008  T:  01/02/2008  Job:  [id number]   cc:   Coralyn Helling, MD  Madolyn Frieze. Jens Som, MD, [facility]  Tera Mater. Clent Ridges, MD

## 2011-01-21 NOTE — H&P (Signed)
NAME:  [name], [name]                ACCOUNT NO.:  [id number]   MEDICAL RECORD NO.:  [id number]          PATIENT TYPE:  INP   LOCATION:  1437                         FACILITY:  [facility]   PHYSICIAN:  Coralyn Helling, MD        DATE OF BIRTH:  07/10/1923   DATE OF ADMISSION:  05/05/2007  DATE OF DISCHARGE:                              HISTORY & PHYSICAL   ADMISSION DIAGNOSIS:  Acute on chronic respiratory failure with  progressive dyspnea.   HISTORY OF PRESENT ILLNESS:  Mr. Baty is an 77 year old male who was  discharged from the hospital on August 12 after being treated for  pneumonia, heart failure and a flare of his vasculitis.  He was seen by  Dr. Gala Romney on August 26 and was felt to be with volume overload and  had his dose of Lasix increased.  He was noted to have oxygen saturation  of 85% with exertion.  He says that after being discharged from the  hospital, his symptoms were reasonably improved.  But then approximately  5 to  6 days ago he started having worsening of his symptoms.  He had  been tapered down on his dose of prednisone from 40 mg a day at the time  his discharge to 30 mg a day approximately 5 days ago.  He remains on  prednisone 30 mg a day.  He says he still has a cough which is  productive of clear sputum.  He is not having any fevers, chills or  sweats.  He denies any chest pain.  He says that he is now getting short  of breath with minimal amounts of exertion.   PAST MEDICAL HISTORY:  1. Congestive heart failure with ejection fraction 40%.  2. Coronary disease, status post myocardial infarction and status post      coronary bypass grafting in 2001.  3. Complete heart block, status post pacemaker implantation.  4. Pauci-immune vasculitis.  5. Pneumonia.  6. Obstructive sleep apnea.  7. Chronic hypoxemia.  8. Cough secondary to ACE inhibitor.  9. Right carotid bruit.  10.Nonsustained ventricular tachycardia.  11.Hypertension.  12.Hyperlipidemia.  13.Prostate cancer, status post radiation therapy on monthly Lupron.  14.Renal cell carcinoma, status post partial left nephrectomy.  15.Gastroesophageal reflux disease.  16.Left eye tumor, status post enucleation.   ALLERGIES:  PENICILLIN and VASOTEC.   CURRENT MEDICATIONS:  1. Lasix 40 mg daily.  2. Coreg 6.25 mg b.i.d.  3. Lupron injections monthly.  4. Prednisone 30 mg daily.  5. Cozaar 50 mg daily.  6. Lipitor 80 mg daily.  7. Potassium 20 mEq daily.  8. Protonix 40 mg daily.  9. __________  10.Vitamin C.  11.Vitamin E.   FAMILY HISTORY:  His mother had colon cancer.  Father had heart disease.   SOCIAL HISTORY:  He is widowed.  He is a former Technical sales engineer.  He has three  grandchildren.  There is no history of tobacco use.  He has occasional  alcohol.   REVIEW OF SYSTEMS:  Unremarkable.   PHYSICAL EXAMINATION:  VITAL SIGNS:  He is seen in my office.  He is  clearly dyspneic to the point where he has difficulty speaking full  sentences with minimal amounts of exertion.  His weight is 179,  temperature is 97.4, blood pressure 120/64, heart rate 88, oxygen  saturation 93% on room air.  HEENT:  He has moon facies.  There is no sinus tenderness.  No oral  lesions.  No lymphadenopathy.  CARDIAC:  S1 and S2.  CHEST:  He has bibasilar rales.  ABDOMEN:  Obese, soft, nontender.  EXTREMITIES:  There was 1+ leg edema.  NEUROLOGIC:  No focal deficits were appreciated.   LABORATORY DATA:  Chest x-ray in my office today shows increased  interstitial markings, particularly in the lower lung fields which  appeared to be somewhat more prominent today, compared to chest x-ray  from August 10.   IMPRESSION:  Acute on chronic respiratory failure in the setting of  pauci-immune vasculitis and congestive heart failure with chronic  hypoxemia.  He clearly has gotten significantly worse symptomatically  since his hospital discharge.  It is hard to say whether this is  worsening of his heart  failure or if it is in fact worsening of is  vasculitis since he had a decrease in the dose of his prednisone.  What  I have arranged for him to do is to be admitted to [facility].  He is to be admitted to the telemetry unit.  I will change him to  intravenous corticosteroids as well as intravenous Lasix.  I will check  his BNP and his erythrocyte sedimentation rate as well as other  laboratory tests.  I will also arrange for him to undergo a CT angiogram  to further assess whether he is having worsening of his fibrosis and  vasculitis.  Then depending upon the results of these tests as well as  his response to these interventions,  I would decide if it would be  beneficial to consider having him undergo lung biopsy to further  determine if there is any other underlying etiology that could be  contributing to his worsening symptom status.      Coralyn Helling, MD  Electronically Signed     VS/MEDQ  D:  05/05/2007  T:  05/06/2007  Job:  [id number]

## 2011-01-21 NOTE — Assessment & Plan Note (Signed)
[facility]                          CHRONIC HEART FAILURE NOTE   NAME:[name], [name]                       MRN:          [id number]  DATE:04/26/2007                            DOB:          05-29-1923    PRIMARY CARDIOLOGIST:  Madolyn Frieze. Jens Som, MD, [facility].   PULMONARY:  Coralyn Helling, M.D.   UROLOGY:  Colleen Can. Deborah Chalk, M.D.   ELECTROPHYSIOLOGIST:  Duke Salvia, MD, [facility].   [name] is new to the heart failure clinic.  He is here today to  establish care.  He has a longstanding history of coronary artery  disease, status post bypass in 2001, with previous implantation of a  pacemaker secondary to history of complete heart block.  [name] is  referred to the heart failure clinic today, status post recent  hospitalization for community-acquired pneumonia, pulmonary vasculitis,  in the setting of congestive heart failure.  He was discharged home  April 20, 2007.  He was attended by pulmonary services during that  hospitalization.  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  [name] was seen in pulmonary prior to this most recent hospitalization  for hemoptysis with increased dyspnea.  The patient was empirically  treated with antibiotics, IV steroids, and bronchodilators.  He had an  echocardiogram done during that hospitalization that showed an EF  decreased to 30%-35%.  Previous echocardiogram in January 2008 showed an  EF of 45%-50%.  The etiology of his worsening heart failure was unclear  at that time.  Dr. Gala Romney saw the patient in consultation during  hospitalization and arranged for the patient to have an outpatient  stress Myoview once respiratory status was stable.  The patient is  scheduled for a stress Myoview here in the office tomorrow.  Since being  discharged home, [name]  complains of ongoing hemoptysis.  He states  it now is a dark maroon blood-tinged sputum.  He also complains of mild  orthopnea, generalized weakness, and fatigue.  He feels that he has been  under increased stress with his recent hospitalization and the ongoing  hemoptysis.  Apparently, he is also under the care of Dr. Patsi Sears for  prostate cancer.  He is status post radiation therapy, on a monthly  hormone injection, and he states the hormone injection makes him feel  quite stressed and out of sorts.  He states he has a follow-up  appointment with Dr. Craige Cotta next week for reevaluation post  hospitalization visit.  He currently is on O2 at 2 liters at bedtime.  [name] is a very pleasant gentleman.  He apparently lives alone.  He  is self-sufficient, has family close by, and an attentive daughter who  checks on him frequently.  He states he feels fine to live by himself.  He cooks his own meals.  He weighs daily.  Has no problems taking his  medication.  He is covered by insurance.   PAST MEDICAL HISTORY:  1. Congestive  heart failure, status post recent hospitalization, where      EF was found to have decreased from 50% to 30%.  Unclear etiology      at this time, most likely ischemic.  2. History of ischemic cardiomyopathy, status post coronary artery      bypass grafting in 2001.  3. History of complete heart block, status post permanent pacemaker in      2007.  4. GERD.  5. History of renal cell carcinoma, status post partial left      nephrectomy.  6. Prostate cancer, status post radiation therapy, now on monthly      hormone injection, followed by Dr. Patsi Sears.  7. Hyperlipidemia.  8. Hypertension.  9. Status post hospitalization for community-acquired pneumonia (no      organism specified).  10.Pulmonary vasculitis/positive ANCA.  11.Obstructive sleep apnea, pending follow-up evaluation by Dr. Craige Cotta      once current respiratory status stable.  12.History of ACE  inhibitor-induced cough.  13.History of right carotid bruit.  14.History of nonsustained ventricular tachycardia.  15.Status post carotid Dopplers, showing 1%-39% bilateral ICA stenosis      in 2003.      a.     Most recent carotid Doppler in October 2006 showed 0%-39%       bilateral ICA stenosis, with mild nonobstructive plaque.  Moderate       to severe stenosis at the RECA ostium most likely accounts for the       bruit, with recommendation for a follow-up carotid in 2 years.   REVIEW OF SYSTEMS:  As stated above.   CURRENT MEDICATIONS:  1. Prednisone 40 mg.  The patient states he is to stay on this dose      for the next 2 days, and then decrease to 30 mg daily until he      follows up with Dr. Craige Cotta next week.  2. Pantoprazole 40 mg daily.  3. Carvedilol 3.125 mg b.i.d.  4. Furosemide 40 mg every other day.  5. Potassium 20 mEq every other day.  6. Lipitor 80 mg daily.  7. Cozaar 50 mg daily.  8. Co-Q 10.  9. Multivitamin, vitamin C, vitamin E, and vitamin D daily.  10.A monthly prostate hormone injection, the name of which patient is      not familiar with.   P.R.N. MEDICATIONS:  Nitroglycerin and Mucinex.   PHYSICAL EXAMINATION:  VITAL SIGNS:  Weight 178 pounds, blood pressure  142/57, heart rate is recorded at 47, apical 94.  GENERAL:  [name] is in no acute distress.  He is a very pleasant 77-  year-old Caucasian gentleman.  NECK:  JVD 7-8 cm at a 45-degree angle.  LUNGS:  He has some scattered rhonchi in the right upper lobe.  Otherwise clear to auscultation.  CARDIOVASCULAR:  Reveals an S1, S2.  He has a bounding premature beat  that is regular.  ABDOMEN:  Soft, nontender.  Positive bowel sounds.  LOWER EXTREMITIES:  The patient has +1 pitting edema bilaterally.  NEUROLOGIC:  Alert and oriented x3.   CLINICAL DATA:  Most recent lab work obtained on April 20, 2007, prior  to discharge:  H&H 15.1 and 43.8.  Potassium 4.4,  BUN and creatinine 36  and 1.53.  PSA  0.26.  A 3-minute walk test here in the office:  Resting  heart rate 94, pulse oximetry 95% on room air.  The patient ambulated 3  minutes.  Pulse oximetry at lowest 91%, heart rate maintained  90-96.  Patient mildly dyspneic, but returned to baseline quickly.   IMPRESSION:  Congestive heart failure, with recent decline in ejection  fraction from 45%-50% to 30%-35% in the setting of recent  hospitalization for community-acquired pneumonia, pulmonary  vasculitis/hemoptysis.  Suspect ischemic etiology.  The patient is  pending stress Myoview on April 27, 2007.  The patient has a mild  volume overload at this time.  I am going to increase his Lasix to 40 mg  daily, and his potassium to 20 daily.  Would recommend adenosine Myoview  tomorrow.  I will increase carvedilol to 6.25 mg p.o. b.i.d.  Have  patient return in 3 weeks for followup.  Will arrange for the patient to  follow up with Dr. Jens Som, his primary cardiology, for review of his  stress Myoview.  Encouraged the patient to keep his appointment with Dr.  Craige Cotta for next week.      Dorian Pod, ACNP  Electronically Signed      [name]. Bensimhon, MD  Electronically Signed   MB/MedQ  DD: 04/26/2007  DT: 04/27/2007  Job #: [id number]

## 2011-01-21 NOTE — Assessment & Plan Note (Signed)
[facility]                             PULMONARY OFFICE NOTE   NAME:[name], [name] REEP                       MRN:          [id number]  DATE:04/12/2007                            DOB:          [birth year]    HISTORY OF PRESENT ILLNESS:  The patient is an 77 year old white male  patient of Dr. Craige Cotta, who has a known history of pulmonary infiltrates w/  positive P-ANCA felt possibly representative of pauci-immune vasculitis,  w/ recurrent hemoptysis, pneumonia, hypoxemia, and obstructive sleep  apnea presents today for an acute office visit.  The patient complains  over the last 3 days that he has had increased cough with some bright  red blood.  The patient denies any fever, chest pain, orthopnea, PND, or  leg swelling.  The patient reports his shortness of breath has been  slightly increased with activity.  He has been wearing his oxygen with  exertion.  The patient's most recent CT scan of his chest on July 3rd,  showed interval progression of the moderately severe chronic  interstitial lung disease.  The patient was seen most recently in the  office one month ago by Dr. Craige Cotta.  At that time, it had been recommended  to decrease prednisone up to 30 mg tapering down very slowly.  The  patient did go down to 25 mg of prednisone, however, shortly after the  above symptoms began.   PAST MEDICAL HISTORY:  Reviewed.   CURRENT MEDICATIONS:  Reviewed.   PHYSICAL EXAMINATION:  GENERAL:  The patient is a pleasant male in no  acute distress.  VITAL SIGNS:  He is afebrile with stable vital signs.  O2 saturation is  95% on room air.  HEENT:  Unremarkable.  NECK:  Supple without cervical adenopathy.  No JVD.  RESPIRATORY:  Lung sounds reveal some bibasilar crackles.  CARDIAC:  Regular rate.  ABDOMEN:  Soft and nontender.  EXTREMITIES:  Warm without any edema.   DATA:  Chest x-ray reveals slightly increased interstitial markings.   IMPRESSION:  Pauci-immune  vasculitis now with increased flare and  associated hemoptysis.   PLAN:  The patient is recommended to increase prednisone up to 40 mg  daily over the next 7 days.  He is also recommended to add in Mucinex DM  twice daily.  We will hold on antibiotics at this time.  The patient  will return back in the office for a 3 to 4-day followup.  If symptoms  do not improve may need hospitalization.  Also, I have instructed the  patient that if symptoms worsen, he is to contact our office for sooner  followup or seek emergency room attention.  Also, the  patient has a followup in 1 weeks with Dr. Craige Cotta and we will plan on  following up with him as well.      Rubye Oaks, NP  Electronically Signed      Coralyn Helling, MD  Electronically Signed   TP/MedQ  DD: 04/12/2007  DT: 04/12/2007  Job #: [id number]

## 2011-01-21 NOTE — Assessment & Plan Note (Signed)
[facility]                             PULMONARY OFFICE NOTE   NAME:DAVISWhitman, Meinhardt                       MRN:          [id number]  DATE:02/08/2007                            DOB:          01-17-1923    I saw Mr. Tiso in followup today for his pauci-immune vasculitis,  hemoptysis, pneumonia, hypoxemia, and obstructive sleep apnea.   He says that his symptoms continue to improve.  He is no longer having  any symptoms of hemoptysis.  He does have occasional cough with clear  sputum production.  He says that he is not really feeling dyspneic.  And, he is essentially back to his previous level of activity.  He  denies having the symptoms of fevers, chills, or sweats.  He is not  having any hoarseness or difficulty swallowing.  He denies any chest  pains.  He also denies any abdominal pain, nausea, vomiting, or  diarrhea.  He is not having any skin rashes or leg swelling.   MEDICATIONS:  Currently are:  1. Lipitor 80 mg daily.  2. Co-Enzyme Q10 once daily.  3. Cozaar 100 mg daily.  4. Prednisone 5 mg daily.   PHYSICAL EXAMINATION:  VITAL SIGNS:  He is 173 pounds, temperature is  97.8, blood pressure is 138/70, heart rate is 42, oxygen saturation is  95% on 2 liters.  HEENT:  There is no sinus tenderness.  No nasal discharge.  No oral  lesions.  No lymphadenopathy.  HEART:  S1 S2.  CHEST:  There are fine rales heard at the bases but no wheezing.  ABDOMEN:  Soft, nontender.  Positive bowel sounds.  EXTREMITIES:  No edema.   On walking in my office today on room air, his oxygen saturation went  down to 90% with a heart rate up to 77, after 3 laps of 185 feet.  And,  he did not have any symptoms of dyspnea.  Chest x-ray from my office  today showed continual improvement of his bilateral lower lobe airspace  disease.   IMPRESSION:  1. Pauci-immune vasculitis.  He appears to have again improved      significantly both clinically and  radiographically.  I would      continue to taper his dose of prednisone and have him switch to      prednisone 5 mg every other day and to determine if he can tolerate      further reduction in the dose of his medicine.  2. Hemoptysis and pneumonia.  This essentially has resolved.  I have      advised him that if deemed necessary he could certainly resume his      aspirin therapy and he says he will discuss this further with his      cardiologist.  3. Hypoxemia.  I would have him continue on supplemental oxygen with      exertion and at night.  And, I will reassess this again at his next      followup visit.  4. Obstructive sleep apnea.  He would prefer to defer further  intervention for this until his next followup.   I will follow up with him in approximately 4-6 weeks.     Coralyn Helling, MD  Electronically Signed    VS/MedQ  DD: 02/08/2007  DT: 02/08/2007  Job #: [id number]

## 2011-01-21 NOTE — Assessment & Plan Note (Signed)
[facility]                            CARDIOLOGY OFFICE NOTE   NAME:[name], [name]                       MRN:          [id number]  DATE:06/01/2007                            DOB:          02/14/1923    [name] is a gentleman that I have seen in the past for coronary  artery disease, status post coronary artery bypass and graft,  bradycardia, status post permanent pacemaker placement.  He also has a  history of pulmonary fibrosis and vasculitis.  He has had multiple  recent admissions.  He was recently hospitalized in the setting of  community-acquired pneumonia, pulmonary vasculitis and possible  congestive heart failure.  During that admission, the patient was  treated with antibiotics, steroids and bronchodilators.  An  echocardiogram was performed that showed an ejection fraction of 30-35%,  which was reduced from previous.  He has, therefore, been seen by  Dorian Pod in the CHF Clinic and his medications have been  titrated.  Also note a Myoview was scheduled as an outpatient to rule  out ischemia as a cause of his worsening LV function.  This was  performed on April 27, 2007.  His ejection fraction was 40%.  There was  a prior inferior infarct with trivial peri-infarct ischemia.  Also note  his Lasix has been increased for volume overload.   His most recent labs on May 04, 2007, showed a BUN and creatinine of  37 and 1.5 and his BNP was 190.  Also note that he had chest CT on  May 05, 2007, that showed pulmonary fibrosis, predominantly in the  lower lobes.  There were subtle areas of ground-glass disease,  suggesting edema or subtle inflammation.  Since I saw him before, he  does have some dyspnea on exertion.  There is no orthopnea, PND, pedal  edema, palpitations, presyncope, syncope or exertional chest pain.  He  occasionally feels a tightness in his chest, but feels this is related  to anxiety.  It is not related to  exertion.   MEDICATIONS INCLUDE:  1. Lipitor 80 mg p.o. daily.  2. Co-enzyme Q.  3. Multivitamin.  4. Vitamin C.  5. Vitamin E.  6. Vitamin D.  7. Lasix 40 mg p.o. daily.  8. Protonix 40 mg p.o. daily.  9. Prednisone 30 mg p.o. daily.  10.Cozaar 25 mg p.o. daily.  11.Potassium 40 mEq p.o. daily.  12.Carvedilol 12.5 mg p.o. b.i.d.   PHYSICAL EXAM:  His physical exam today shows a blood pressure of  126/70, his pulse was 75.  He weighs 175 pounds.  HEENT:  Significant for an artificial left eye.  NECK:  Supple.  CHEST:  Shows dry crackles at the bases.  CARDIOVASCULAR EXAM:  Reveals a regular rate and rhythm.  ABDOMINAL EXAM:  Benign.  EXTREMITIES:  Show no edema.   DIAGNOSES:  1. Recent dyspnea:  The etiology of this appears to be predominantly      pulmonary with worsening pulmonary fibrosis and also vasculitis.      There may also be a component of congestive heart failure (however,  his BNP was only minimally elevated and he has no volume overload      on exam today).  We will continue with his Lasix and I will check a      BMET today, as well as a BNP.  Note:  We will need to be very      careful with diuresis, as he does have a history of renal cell      carcinoma and is status post partial left nephrectomy.  2. Cardiomyopathy:  He will continue on his ACE inhibitor and diuretic      and I will increase his Coreg to 18.75 mg p.o. b.i.d. with the      ultimate plans of increasing to 25 b.i.d.  3. History of coronary disease:  Note that patient is off his aspirin,      due to hemoptysis.  He will continue on his ARB, beta blocker, and      statin.  4. History of complete heart block, status post implantation of      pacemaker.  5. Pulmonary fibrosis/pulmonary vasculitis.  6. History of ACE inhibitor-induced cough.  7. History of nonsustained ventricular tachycardia.  8. History of mild cerebrovascular disease.  The patient will need      followup carotid Dopplers  in October.  9. Hypertension.  His blood pressure appears to be adequately      controlled on his present medications.  10.Hyperlipidemia.  He will continue on his statin.  11.Mild renal insufficiency.  We will check a BMET today and, again,      patient is status post partial left nephrectomy.   I will see him back in approximately eight weeks to review his symptoms.     Madolyn Frieze Jens Som, MD, [facility]  Electronically Signed    BSC/MedQ  DD: 06/01/2007  DT: 06/02/2007  Job #: [id number]

## 2011-01-21 NOTE — Assessment & Plan Note (Signed)
[facility]                            CARDIOLOGY OFFICE NOTE   NAME:Lantier, JASAN DOUGHTIE                       MRN:          [id number]  DATE:05/25/2008                            DOB:          Feb 08, 1923    Mr. Tafoya is a very pleasant 77 year old gentleman has a history of  coronary artery disease status post coronary artery bypassing graft,  ischemic cardiomyopathy, combined systolic and diastolic heart failure,  history of pacemaker placement and pulmonary fibrosis last vasculitis.  His most recent Myoview was performed on April 27, 2007.  His ejection  fraction was 40%.  There is a prior inferior infarct and trivial peri-  infarct ischemia.  His most recent echo was performed on March 13, 2008.  His ejection fraction was felt to be 50%.  There is mild aortic  insufficiency and the left atrium is mildly dilated.  When I last saw  him we did schedule him to have lower extremity Dopplers as there was  increased swelling and we would concern about DVT.  However, this was  negative.  Since then, he was seen by Dr. Clent Ridges on this past Monday due to  increased pedal edema as well as dyspnea on exertion.  There is no  orthopnea or PND.  He has not had exertional chest pain.  Dr. Clent Ridges  increased his Lasix to 160 mg p.o. b.i.d., but he has been only 120  b.i.d.  He did states this made it feels somewhat better, but he still  has pedal edema.   MEDICATIONS:  1. Vitamin E.  2. Cozaar 25 mg p.o. daily.  3. Lutein.  4. Bilberry.  5. Potassium 40 mEq in the morning and 20 in the evening.  6. Lasix 120 mg p.o. b.i.d.  7. Prednisone 5 mg p.o. daily.  8. Vitamin D.  9. Multivitamin.  10.Lipitor 80 mg daily.  11.Coenzyme Q.  12.Coreg 12.5 mg p.o. b.i.d.  13.Calcium.  14.Vitamin C.   PHYSICAL EXAMINATION:  VITAL SIGNS:  Blood pressure of 116/60, his pulse  is 79.  Weighs 185 pounds this compares to 182 on May 04, 2008.  HEENT:  Normal.  NECK:  Supple.  CHEST:   Clear.  CARDIOVASCULAR:  Regular rate and rhythm.  ABDOMEN:  No tenderness.  EXTREMITIES:  Showed 2+ edema.   DIAGNOSES:  1. Edema - This is most likely secondary to combination of      systolic/diastolic congestive heart failure as well as right      ventricular dysfunction secondary to pulmonary disease.  We will      continue with Lasix at 120 mg p.o. b.i.d.  I will check a BMET and      a BMP today.  2. Coronary artery disease status post bypassing graft - He will      continue on his beta-blocker as well as his ARB and statin.  He is      not on an aspirin due to his history of hemoptysis.  3. Acute on chronic combined systolic and diastolic heart failure -      Again, we will  continue with the higher dose of Lasix and we check      a BMET and a BMP.  His recent echocardiogram showed low normal left      ventricular function.  4. Hyperlipidemia - Continue on a statin.  5. Dyspnea - This is multifactorial, most likely secondary to      pulmonary fibrosis as well as congestive heart failure.  6. History of pacemaker placement.  7. Pulmonary fibrosis last vasculitis.  8. History of angiotensin-converting enzyme inhibitor induced cough.  9. History of mild cerebrovascular disease.  10.Hypertension - His blood pressure is adequately control.  11.History of renal insufficiency - We will check a BMET.  I will see      him back in 4 weeks.  Now, we have also asked him to use      compression stocking as well as keep his legs elevated.     Madolyn Frieze Jens Som, MD, [facility]  Electronically Signed    BSC/MedQ  DD: 05/25/2008  DT: 05/26/2008  Job #: [id number]

## 2011-01-21 NOTE — Assessment & Plan Note (Signed)
[facility]ALTHCARE                            CARDIOLOGY OFFICE NOTE   NAME:[name], [name]                       MRN:          [id number]  DATE:12/22/2007                            DOB:          25-Nov-1922    [name] is an 77 year old gentleman that I have followed for coronary  artery disease, status post coronary artery bypass graft, history of  pacemaker placement and ischemic cardiomyopathy.  He also has pulmonary  fibrosis, as well as vasculitis.  His most recent Myoview was performed  on 04/27/2007.  At that time, the study was low risk with a prior  inferior infarct and trivial peri-infarct ischemia.  Ejection fraction  was 40%.  His most recent echocardiogram was on April 19, 2007.  Ejection fraction was 30-35%.  There was mild aortic insufficiency and  mitral regurgitation.  The patient was admitted to [facility]  back in December with what was felt to be a flare of his pulmonary  vasculitis.  His blood pressure was low at that time, and his carvedilol  was decreased to 12.5 mg p.o. b.i.d. and his Cozaar was discontinued.  Recently, the patient has had problems with back pain and has been  diagnosed with disk fractures.  He is contemplating having kyphoplasty  which would require general anesthesia.  We were asked to evaluate prior  to that.  Note, he denies any increased dyspnea on exertion, orthopnea,  PND or chest pain.  He did have some pedal edema over the weekend, but  this resolved with a brief increased dose of Lasix.   PRESENT MEDICATIONS:  1. Lasix 40 mg p.o. daily.  2. Potassium 40 mg p.o. daily.  3. Lipitor 20 mg p.o. daily.  4. Coenzyme Q.  5. Coreg 12.5 mg p.o. b.i.d.  6. Prednisone 20 mg p.o. daily.  7. Actonel 35 mg once a week.  8. Prilosec.  9. Vitamin D.  10.Calcium.  11.Ocuvite.  12.Vitamin C.   PHYSICAL EXAMINATION:  VITAL SIGNS:  Today, shows a blood pressure of  131/75.  He weighs 180 pounds.  HEENT:   Normal other than a previous enucleated eye.  NECK:  Supple.  CHEST:  Shows mild basilar crackles.  CARDIOVASCULAR:  Reveals a regular rate and rhythm.  ABDOMEN:  Shows no tenderness.  EXTREMITIES:  Show no edema.   DIAGNOSES:  1. Preoperative evaluation prior to kyphoplasty - this apparently will      require general anesthesia.  Given his age of 77 years old,      pulmonary vasculitis/fibrosis, ischemic cardiomyopathy/coronary      artery disease, renal insufficiency.  I think any procedure on Mr.      [name] would be high risk.  However, he may have no other option.      We would not pursue any further cardiac evaluation prior to the      procedure as his Myoview from August showed a previous infarct with      trivial peri-infarct ischemia, and was felt to be low risk.  He has      not had chest  pain.  If possible, we would like for him to have the      procedure under local, but I will leave this Dr. Phoebe Perch.  2. Ischemic cardiomyopathy - he will continue on his beta-blocker at      12.5 mg p.o. b.i.d..  I have resumed his Cozaar at 25 mg p.o.      daily.  We will check a BMET today, as well as again in 1 week.  3. History of coronary disease - he will continue on statin and his      beta-blocker, as well as ARB.  He is not on aspirin as he will need      the procedure in the near future.  4. Status post pacemaker placement secondary to complete heart block.  5. Pulmonary fibrosis/vasculitis.  6. History of angiotensin converting enzyme inhibitor induced cough.  7. History of nonsustained tachycardia.  8. Cerebrovascular disease - his most recent carotid Dopplers showed      only minimal disease.  9. Hypertension - his blood pressure is adequately controlled.  10.Hyperlipidemia - he will continue on a statin.  11.Renal insufficiency.   We will see him back in 6 months.     Madolyn Frieze Jens Som, MD, [facility]  Electronically Signed    BSC/MedQ  DD: 12/22/2007  DT: 12/22/2007  Job  #: [id number]   cc:   Clydene Fake, M.D.

## 2011-01-21 NOTE — Assessment & Plan Note (Signed)
[facility] HEALTHCARE                            CARDIOLOGY OFFICE NOTE   NAME:Pheasant, KALIN KYLER                       MRN:          [id number]  DATE:07/21/2008                            DOB:          12-06-1922    Mr. Jablonowski returns for followup today.  He is a gentleman who has a  history of coronary artery disease status post coronary bypassing graft,  ischemic cardiomyopathy, combined systolic and diastolic heart failure,  history of pacemaker placement, and pulmonary fibrosis/vasculitis.  I  last saw him on May 25, 2008, he was having increased edema.  His  Lasix had recently been increased to 120 b.i.d.  Since then, he does  have dyspnea on exertion which has been a chronic issue and attributed  to his pulmonary fibrosis.  There is no orthopnea or PND, and his pedal  edema has improved on the higher dose of Lasix.  He has not had chest  pain, palpitations, or syncope.   His medications include:  1. Vitamin E.  2. Cozaar 25 mg p.o. daily.  3. Potassium 40 mEq in the morning and 20 in the evening.  4. Prednisone 5 mg p.o. every other day.  5. Vitamin D.  6. Multivitamin.  7. Lasix 120 mg p.o. b.i.d.  8. Lipitor 80 mg p.o. daily.  9. Coenzyme Q.  10.Coreg 25 mg p.o. b.i.d.  11.Actonel.  12.Calcium.  13.Vitamin C.   PHYSICAL EXAMINATION:  VITAL SIGNS:  Blood pressure of 113/67 and his  pulse is 74.  He weighs 184 pounds.  HEENT:  Normal.  NECK:  Supple.  CHEST:  Clear.  CARDIOVASCULAR:  Regular rate and rhythm.  ABDOMEN:  No tenderness.  EXTREMITIES:  Edema 1+.   DIAGNOSES:  1. Edema - this is improved on the present dose of diuretics.  We will      continue with his Lasix 120 mg p.o. b.i.d.  Note, his BMP when I      checked it in September was normal.  We will recheck his BMET today      to follow his potassium and renal function.  2. Coronary artery disease status post coronary bypassing graft - he      will continue on his  beta-blocker, but I am decreasing this to 6.25      mg p.o. b.i.d. as his blood pressure apparently is running in the      low 90s with some fatigue at home.  Hopefully, this will improve.      He will continue with his ARB and statin.  He is not on an aspirin      due to his history of hemoptysis.  3. History of acute on chronic combined systolic and diastolic heart      failure - we will continue on his present dose of Lasix.  4. Hyperlipidemia - continue on his statin.  5. Dyspnea - this is most likely secondary to his pulmonary fibrosis.  6. History of pacemaker placement.  7. Pulmonary fibrosis/vasculitis.  8. History of ACE inhibitor-induced cough.  9. History of mild cerebrovascular disease.  10.Hypertension - we are decreasing his Coreg due to mildly decreased      blood pressure.  11.History of renal insufficiency - we will check his BMET.   We will see him back in 3 months.     Madolyn Frieze Jens Som, MD, [facility]  Electronically Signed    BSC/MedQ  DD: 07/21/2008  DT: 07/22/2008  Job #: [id number]

## 2011-01-21 NOTE — Assessment & Plan Note (Signed)
[facility] HEALTHCARE                            CARDIOLOGY OFFICE NOTE   NAME:[name], [name]                       MRN:          [id number]  DATE:01/24/2008                            DOB:          14-Jul-1923    Mr. Nakanishi is a very pleasant 85-year gentleman who has a history of  coronary artery disease status post coronary artery bypass grafting,  ischemic cardiomyopathy, congestive heart failure, history of pacemaker  placement, and pulmonary fibrosis/vasculitis.  Since I last saw him he  has undergone his procedure for kyphoplasty.  He did reasonably well.  However, he did require hospitalization after the procedure secondary to  volume excess.  He was diuresed and did well and was discharge.  Since  then, he continues to have dyspnea on exertion.  He thinks it is slowly  worsening compared to previous.  There is no orthopnea or PND but there  is pedal edema.  It has worsened since I saw him previously.  He has  been taking Lasix 40 mg b.i.d. but still has some edema.  He does not  have exertional chest pain.  He does state that occasionally he feels  stressed.  He feels a mild pain that improves as he calms down.  The  chest pain does not radiate.  Is not pleuritic or positional.  It is not  related to food.  There is no associated nausea, vomiting, shortness of  breath or diaphoresis.   PRESENT MEDICATIONS:  1. Vitamin E.  2. Cozaar 25 mg daily.  3. [employer] routine.  4. Lasix 40 mg p.o. b.i.d.  5. Potassium 40 mEq p.o. daily.  6. Lipitor 80 mg daily.  7. Coenzyme Q.  8. Coreg 12.5 mg p.o. b.i.d.  9. Actonel.  10.Prilosec.  11.Vitamin D.  12.Calcium.  13.Ocuvite.  14.Prednisone 15 mg daily.  15.Vitamin C.   PHYSICAL EXAMINATION:  VITAL SIGNS:  Today shows a blood pressure of  146/81.  His pulse is 75.  HEENT:  Remarkable for previous enucleation of his left eye.  NECK:  Supple.  CHEST:  Minimal crackles at the bases.  CARDIOVASCULAR:  Regular rate and rhythm.  ABDOMEN:  No tenderness.  EXTREMITIES:  He has  1+ ankle edema.   DIAGNOSES:  1. Acute on chronic systolic congestive heart failure - Mr. Route is      somewhat volume overloaded today with pedal edema.  I think this is      most likely due to a combination of left heart failure, right heart      failure due to his significant lung disease and potential volume      overload due to his steroid use.  He has had a history of renal      insufficiency.  We will continue his Lasix at 40 mg p.o. b.i.d. as      well as his present potassium dose.  I will check a BMET and a      magnesium today.  We will need to be careful and follow his renal      function closely and  adjust his diuretic as indicated.  He will      continue on his ARB and beta blocker for now.  2. Ischemic cardiomyopathy - he will continue on his beta blocker      (Coreg 12.5 mg p.o. b.i.d.).  He will also continue on his Cozaar.      Note, he is not on an aspirin as he has had hemoptysis due to his      pulmonary fibrosis/vasculitis.  We can consider this in the future      if this improves.  3. History of coronary disease - he will continue on his statin, beta-      blocker, ARB and he is not on aspirin as described above.  4. Status post pacemaker placement secondary to complete heart block.  5. Pulmonary fibrosis last vasculitis - he will continue on steroids      and this is being managed by Dr. Craige Cotta.  6. History of  ACE inhibitor induced cough.  7. History of nonsustained ventricular tachycardia.  8. Cerebrovascular disease - his most recent carotid Dopplers showed      only minimal disease.  9. Hypertension - his blood pressure is adequately controlled.  10.Hyperlipidemia - he will continue on his Lipitor and we will check      lipids and liver today and adjust as indicated.  11.Renal insufficiency.   He will see me back in three months.     Madolyn Frieze Jens Som, MD, [facility]   Electronically Signed    BSC/MedQ  DD: 01/24/2008  DT: 01/24/2008  Job #: [id number]

## 2011-01-21 NOTE — Consult Note (Signed)
NAME:  [name], [name]                ACCOUNT NO.:  [id number]   MEDICAL RECORD NO.:  [id number]          PATIENT TYPE:  OBV   LOCATION:  4735                         FACILITY:  [facility]   PHYSICIAN:  Doylene Canning. Ladona Ridgel, MD    DATE OF BIRTH:  [birth year]   DATE OF CONSULTATION:  01/01/2008  DATE OF DISCHARGE:                                 CONSULTATION   REQUESTING PHYSICIAN:  Illene Regulus, MD.   INDICATION FOR CONSULTATION:  Evaluation of patient with volume overload  and shortness of breath with oxygen desaturation.  The patient is a very  pleasant 77 year old man with longstanding coronary artery disease  status post myocardial infarction status post bypass surgery in 2001  with a CABG at that time.  The patient had initially mild-to-moderate LV  dysfunction but worsening LV dysfunction in the last year.  He also  carries a diagnosis of pulmonary fibrosis and is on oxygen therapy.  The  patient recently after suffering a severe back pain underwent a  kyphoplasty and post procedurally, the patient and his family noted  increasing peripheral edema and shortness of breath.  He presented to  the emergency department where his oxygen saturation was in the mid 80s  and he subsequently has been treated with intravenous Lasix and  additional oxygen and nitroglycerin and is referred now for additional  evaluation.  He denies syncope.  He denies chest pain.   MEDICATIONS:  1. Cozaar 25 a day.  2. Lipitor 80 a day.  3. Multiple vitamins.  4. Lasix 40 a day.  5. Prednisone 20 a day.  6. Coreg 12.5 twice a day.  7. Home oxygen.   SOCIAL HISTORY:  The patient lives in [place].  He is not married.  He  is retired.  He denies tobacco or ethanol abuse, having stopped smoking  many years in the distant past.   FAMILY HISTORY:  Notable for mother died of colon cancer and father of  coronary artery disease.   REVIEW OF SYSTEMS:  Notable for some increased urinary frequency and  chronic lower  abdominal pain and musculoskeletal pain.  He has chronic  dyspnea, worse on admission.  He has a history of peripheral edema  bilaterally with worse most recently.   PHYSICAL EXAMINATION:  GENERAL:  Him being a pleasant elderly-appearing  man in no acute distress.  VITAL SIGNS:  Blood pressure 100/58, pulse 75 and regular, respirations  16-18, temperature is 98, oxygen saturation on 4 L was 98%.  HEENT:  Normocephalic and atraumatic.  Pupils are equal and round.  The  oropharynx is moist.  Sclerae anicteric.  NECK:  Revealed no jugular distention.  No thyromegaly.  Trachea is  midline.  His carotids are 2+ and symmetric.  LUNGS:  Clear bilaterally to auscultation.  No wheezes, rales or rhonchi  were present.  There is no increased work of breathing.  Decreased  breath sounds.  CARDIAC:  Revealed a regular rate and rhythm and normal S1 and S2.  There are no murmurs, rubs, or gallops that I could appreciate.  His  heart sounds were distant.  There was an enlarged PMI.  ABDOMEN:  Obese, nontender, nondistended.  There was no organomegaly.  The bowel sounds are present.  There was no rebound or guarding.  EXTREMITIES:  Demonstrated trace peripheral edema bilaterally right  greater than left.  NEUROLOGIC:  Alert and oriented x3.  His cranial nerves intact.  His  strength is 5/5 and symmetric.   LABS:  The EKG demonstrates sinus rhythm with atrial pacing.  The BNP  was only slightly elevated in the 200 range.  Cardiac enzymes were  negative.  Creatinine is 1.6.   IMPRESSION:  1. Mild volume overload after kyphoplasty  2. Ischemic cardiomyopathy, ejection fraction 30-35%.  3. Pulmonary fibrosis on chronic home oxygen therapy.   DISCUSSION:  Overall, Mr. Pustejovsky is slightly volume overloaded and I  agree with plans for intravenous Lasix and additional plans for early  discharge.  He will follow up with Dr. Jens Som.      Doylene Canning. Ladona Ridgel, MD  Electronically Signed     GWT/MEDQ  D:   01/01/2008  T:  01/01/2008  Job:  [id number]

## 2011-01-21 NOTE — Assessment & Plan Note (Signed)
[facility]                          CHRONIC HEART FAILURE NOTE   [name], [name]                       MRN:          [id number]  DATE:05/27/2007                            DOB:          November 12, 1922    PULMONOLOGIST:  Coralyn Helling, M.D.   PRIMARY CARDIOLOGIST:  Madolyn Frieze. Jens Som, MD, [facility].   ELECTROPHYSIOLOGIST:  Duke Salvia, MD, [facility].   [name] returns today for followup of his congestive heart failure.  He was just recently discharged from [facility], where he was  admitted by Dr. Craige Cotta for increased respiratory distress in the setting  of chronic pauciimmune vasculitis and chronic hypoxemia.  Mr. Stepanek  states his breathing is somewhat better since he was discharged home.  He continues to complain of ongoing fatigue.  He states he saw Dr. Craige Cotta  today and was told he was stable.  He follows up with him again in  October.  He also has a followup appointment with Dr. Jens Som next  week.  He states he was diagnosed with obstructive sleep apnea during  this hospitalization and is pending further workup for CPAP therapy.  He  denies any orthopnea, PND, fevers, chills, lightheadedness or dizziness,  presyncope or syncopal episodes.  He does state he gets mildly dizzy if  he bends over to feed his dogs and stands up quickly.  Otherwise, he  continues to be very limited secondary to his respiratory status.  He is  O2 dependent and he is easily class III symptoms without signs of volume  overload.   I do not have Mr. Schauf chart available at this time.  It is still at  Dr. Evlyn Courier office but his past medical history includes:  1. Congestive heart failure, most likely secondary to ischemia      cardiomyopathy with EF currently 40%.  2. Coronary artery disease, status post MI .      a.     Status post coronary artery bypass surgery in 2001.  3. History of complete heart block, status post implantation of a      pacemaker.  4. Status  post recent hospitalizations x2 initially for community-      acquired pneumonia in the setting of pulmonary vasculitis and      hemoptysis.  Most recently for increased hypoxemia secondary to      pauciimmune vasculitis.  5. Obstructive sleep apnea, pending CPAP fitting.  6. Chronic O2 dependency.  7. History of ACE inhibitor induced cough.  8. Right carotid bruit.  9. Status post carotid Doppler showing 1-39% bilateral ICA stenoses in      2003.  Most recent carotid Doppler in 2006, showed 0-39% bilateral      ICA stenoses with mild nonobstructive plaque.  Moderate to severe      stenosis at the RECA ostia most likely accounts for the bruit with      recommendations for followup carotid in 2 years (2008).  We will      leave this up to Dr. Jens Som.  10.Hypertension.  11.Hyperlipidemia.  12.Prostate cancer, status post radiation therapy now  on monthly      Lupron injections, followed by Dr. Patsi Sears.  13.History of renal cell carcinoma, status post partial left      nephrectomy.  14.GERD.  15.Left eye tumor, status post enucleation.   REVIEW OF SYSTEMS:  As stated above.   CURRENT MEDICATIONS:  1. Protonix 40 mg daily.  2. Furosemide 40 mg daily.  3. Prednisone 40 mg daily.  4. Coreg 12.5 mg b.i.d.  5. Lipitor 80 mg daily.  6. CoQ-10 at 100 mg daily.  7. Vitamin D 1,000 mg daily.  8. Cozaar 25 mg daily.   PHYSICAL EXAMINATION:  VITAL SIGNS:  Weight 173, blood pressure  initially recorded 86/53 by [facility].  Manual blood pressure left arm  122/68.  Heart rate 76.   IMPRESSION:  Congestive heart failure.  New York Heart Association class  III without signs of volume overload at this time.   We will continue current medications.  The patient is to follow up with Dr. Jens Som next week for routine  cardiology visit.  Will continue Coreg at current dose.  Would like to switch the patient over to a more appropriate ARB in  regards to his heart failure.  We will leave this  up to Dr. Ludwig Clarks  discretion.      Dorian Pod, ACNP  Electronically Signed      [name]. Bensimhon, MD  Electronically Signed   MB/MedQ  DD: 05/27/2007  DT: 05/28/2007  Job #: [id number]

## 2011-01-21 NOTE — Assessment & Plan Note (Signed)
[place] [facility]                             PULMONARY OFFICE NOTE   NAME:Mayeda, QUARAN KEDZIERSKI                       MRN:          [id number]  DATE:06/28/2007                            DOB:          Mar 11, 1923    I saw [name] today in followup for his pauci-immune vasculitis and  pulmonary fibrosis, hypoxemia, and obstructive sleep apnea.   He remains on prednisone at 30 mg a day and says he has not noticed any  worsening of his symptoms.  He remains on oxygen at 2 liters by nasal  cannula.  He had tried using an auto-CPAP machine at home.  He was  fitted with a full face mask but says he had trouble tolerating this.  He says that as soon as he put it on, he felt like he could not catch  his breath and that he was being smothered and as a result he has not  really been able to use it.   CURRENT MEDICATIONS:  1. Prednisone 30 mg daily.  2. Lipitor 80 mg daily.  3. Coenzyme-Q10.  4. Multivitamin.  5. Vitamin C.  6. Vitamin E.  7. Lupron injections.  8. Vitamin D.  9. Lasix 40 mg daily.  10.Protonix 40 mg daily.  11.Cozaar 25 mg daily.  12.Potassium 40 mEq daily.  13.Carvedilol 12.5 mg b.i.d.   PHYSICAL EXAMINATION:  VITAL SIGNS:  He is 178 pounds.  Temperature is  97.6, blood pressure is 128/66, heart rate is 75, oxygen saturation is  96% on 2 liters at rest, however, he dropped to 84% on room air after  walking 370 feet.  HEENT:  There is no sinus tenderness, no oral lesions.  He has a moon  faces.  HEART:  S1 S2.  CHEST:  Fine crackles heard at the bases but no wheezing.  ABDOMEN:  Soft, nontender.  EXTREMITIES:  No edema.   IMPRESSION:  1. Pauci-immune vasculitis with pulmonary fibrosis. I will again      attempt to wean down the dose of his prednisone.  I will drop him      to 20 mg a day and monitor his symptom response.  2. Hypoxemia.  He is to remain on supplemental oxygen at 2 liters with      exertion as well as at night.  I have  advised him that he could      take a break for brief period of time at rest if he wanted to but      that he should remain on the oxygen otherwise.  3. Obstructive sleep apnea.  He would like to try and work with using      his CPAP machine at home for a little bit longer.  I have advised      him to call me in the next few weeks to see how he is doing.  If he      is still having difficulties, then we may need to consider either      trying auto-BiPAP at home or having him return to the sleep lab for  further evaluation.  4. I have administered an influenza vaccination for him in my office      today.  5. He is due to have followup with cardiology for his systolic heart      failure.   I will follow up with him in approximately 1 month.     Coralyn Helling, MD  Electronically Signed    VS/MedQ  DD: 06/28/2007  DT: 06/28/2007  Job #: [id number]   cc:   Madolyn Frieze. Jens Som, MD, [facility]

## 2011-01-21 NOTE — Op Note (Signed)
NAMEMECHEL, SCHUTTER                ACCOUNT NO.:  [id number]   MEDICAL RECORD NO.:  [id number]          PATIENT TYPE:  INP   LOCATION:  2103                         FACILITY:  [facility]   PHYSICIAN:  Clydene Fake, M.D.  DATE OF BIRTH:  1923-01-12   DATE OF PROCEDURE:  12/30/2007  DATE OF DISCHARGE:                               OPERATIVE REPORT   PREOPERATIVE DIAGNOSIS:  Compression fracture of T12, L1, and L2,  osteoporosis.   POSTOPERATIVE DIAGNOSIS:  Compression fracture of T12, L1, and L2,  osteoporosis.   PROCEDURE:  Balloon kyphoplasty with methyl methacrylate T12, L1, L2  with intra-operative fluoroscopy and T11 and L1 bone biopsies.   ANESTHESIA:  General endotracheal anesthesia.   ESTIMATED BLOOD LOSS:  None.   DRAINAGE:  None.   COMPLICATIONS:  None.   Biopsies sent for permanent section.   REASON FOR PROCEDURE:  The patient is an 77 year old gentleman with mid  back pain, compression fractures L1 and L2, while we are following and  working him up, L1 is compressed further, and a new T12 fracture  occurred.  The patient has significant pulmonary and cardiac problems,  did see his pulmonologist and cardiologist.  Preop, though were  considered at high risk.  With considering significant problem list,  was deemed appropriate to have the surgery.  Preoperative anesthesia  consult also obtained, and after discussion with family, medical team,  and the extent of surgery, it was felt that the general anesthesia was  the best avenue.   PROCEDURE IN DETAIL:  The patient was brought in the operating room.  General anesthesia was induced.  The patient was placed in prone  position and flat rolls, and prepped and draped in sterile fashion.  AP  and lateral fluoroscopic imaging were set up, thus we can see the  vertebral arteries, pedicles of T12, L1, and L2 starting at that T12 on  the left side.  A small incision was made after injecting the area with  the 5 mL of 1%  lidocaine with epinephrine.  Needle and biopsy probe  placed now through the pedicle using AP and lateral fluoroscopic imaging  then placed through the pedicle into the T12 vertebral body.  Bone  biopsy was taken and then drill was used to drill through the vertebral  body.  We were able to get across the midline, and a single balloon was  then placed into the vertebral body, inflated slightly.  Attention was  then taken to L1 and troponin was given in the left side, injected with  5 mL of 1% lidocaine with epinephrine.  A stab incision was made.  Needle biopsy of bone.  Needle was then placed down through the pedicle  into the vertebral body using a fluoroscopy as a guide.  We again took a  bone biopsy and the specimens were sent.  His biopsies of vertebral  bodies, and they were sent for permanent sections.  Drill was used to  drill into the vertebral body making room for the balloon, but we did  not the cross midline.  Balloon was placed and  then on the contralateral  side using fluoroscopic imaging found skin entry injected with 5 mL of  1% lidocaine with epinephrine.  A stab incision was made.  A needle was  placed down through the pedicle.  The needle was removed leaving the  working port which we will use a drill to drill into the  vertebral  body, placed another balloon.  We then sequentially inflated the  balloons at L1 and inflated the balloon at T12 reducing the fracture  somewhat and deflated within the vertebral body for methyl methacrylate.  We deflated the balloon at T12 and removed it and then filled T12  vertebral body with about 4.5 mL of methyl methacrylate.  This was done  under fluoroscopic imaging with AP and lateral with good filling of the  vertebral defect and interdigitating of the rest of the vertebral body  with methyl methacrylate.When we were finished, working port was  removed.  These processes were repeated bilaterally at the L1 level, the  L1 vertebral body  with methyl methacrylate under fluoroscopic imaging in  good positioning of methyl methacrylate and the working ports were  removed.  Attention was then taken to the skin entry port through the L2  pedicle and vertebral body.  Infiltrated with 5 mL of 1% lidocaine with  epinephrine.  A stab incision was then made.  A needle placed through  the pedicle into the vertebral body.  Working port was left in place and  drill was used to drill into the vertebral body.  We did not cross the  midline and balloon was placed.  Attention was taken to the  contralateral sides, again same technique was used to place needle down  through the pedicle into L2 vertebral body and the balloon was placed  there.  We sequentially inflated the balloons after reduction of the  fracture.  Balloons were removed as we injected methyl methacrylate into  the L2 vertebral body under low pressure using fluoroscopic as guide and  we removed working ports.  Final AP and lateral fluoroscopic images were  obtained showing good position of methyl methacrylate within the  vertebral bodies and some restoration of vertebral body heights.  The 5  stab incisions were closed with 4-0 underlying interrupted sutures and  skin closed with Dermabond skin glue and when that was dried, Band-Aids  were placed.  The patient was placed back in the supine position,  awakened from the anesthesia, and transferred to recovery room in  stable, but fair condition.           ______________________________  Clydene Fake, M.D.     JRH/MEDQ  D:  12/30/2007  T:  12/31/2007  Job:  [id number]

## 2011-01-21 NOTE — Consult Note (Signed)
NAME:  [name], [name] NO.:  [id number]   MEDICAL RECORD NO.:  [id number]          PATIENT TYPE:  INP   LOCATION:  1437                         FACILITY:  [facility]   PHYSICIAN:  Pricilla Riffle, MD, FACCDATE OF BIRTH:  Jul 18, 1923   DATE OF CONSULTATION:  05/06/2007  DATE OF DISCHARGE:                                 CONSULTATION   IDENTIFICATION:  [name] is an 77 year old gentleman who we are asked  to see regarding ventricular ectopy.   HISTORY OF PRESENT ILLNESS:  The patient has a history of coronary  artery disease status post CABG in 2001 (LIMA to LAD; SVG to circumflex;  SVG to diagonal; SVG to PDA).  Last echocardiogram on April 19, 2007,  showed an LVEF of 30-35% with diffuse hypokinesis question posterior  akinesis.  A Myoview was done on August 19 that showed inferior infarct  with trivial peri-infarct ischemia.   Note, the patient was seen and in Cardiology Clinic in August 16.  Coreg  was increased to 6.25 b.i.d. and Lasix increased to daily.  His O2 sats  were in the 90s.  On August 26, he had increased shortness of breath and  fatigue with ambulation and saturations decreased to 84.  The patient  was seen the following day by Coralyn Helling in clinic and was admitted to  [facility] for further treatment.  He has been placed on  increased steroids and IV Lasix.  Note, BNP on admission was 190.   Since admission, he has been on telemetry, has had sinus rhythm and  episodes of ventricular bigeminy.   On talking to the patient, he says he was doing okay until about last  Friday about 6 days ago with breathing got worse.  By Tuesday it was  worse with minimal movement.  He denies dizziness, though occasionally  says he feels weak, denies chest pain.  No PND.   ALLERGIES:  PENICILLIN AND ACE LEADING TO A COUGH.   PAST MEDICAL HISTORY:  1. CAD/ischemic cardiomyopathy as noted above in HPI.  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  2. History of second-degree heart block with pacer placement,      [employer], in August 11, 2006.  3. History of nonsustained VT.  4. Sleep apnea not on CPAP.  5. History of Right ECA stenosis with moderately severe mild ICA      stenoses.  6. Hypertension.  7. Dyslipidemia.  8. GU Reflux.  9. Enucleation with history of tumor.  10.History of dyslipidemia.  11.History of prostate cancer status post XRT on Lupron.  12.Renal CA status post left nephrectomy.   CURRENT MEDICATIONS:  1. Lasix 40 IV b.i.d.  2. Solu-Medrol 40 q. 8.  3. Protonix 40 daily.  4. Coreg 6.25 b.i.d.  5. Cozaar 50.  6. Potassium 40.  7. Lipitor 80.   SOCIAL HISTORY:  The patient is a widow, does not smoke, does not drink.   FAMILY HISTORY:  Positive for colon cancer.  Dad with coronary artery  disease.   REVIEW OF SYSTEMS:  Cough, some history of hemoptysis and shortness of  breath as noted.  Otherwise, all systems reviewed, negative for the  above problem except as noted.   PHYSICAL EXAMINATION:  GENERAL:  The patient is in no distress at rest.  VITAL SIGNS:  Blood pressure has been 128-192 over 80s to 90s, pulses in  the 40s to 70 range, temperature is 97.8, O2 sat on room air is 95%.  HEENT:  Normocephalic, atraumatic.  EOMI.  PERRL.  Throat clear.  Mucous  membranes moist.  NECK:  JVP is approximately 10.  LUNGS:  Dry rales at bases.  CARDIAC:  Frequent skips (bigeminy pattern) S1-S2, no S3.  Grade 1-2/6  systolic murmur heard best at the left sternal border.  ABDOMEN:  Supple, nontender.  No hepatomegaly.  No masses.  Normal bowel  sounds.  EXTREMITIES:  No edema, 2+ pulses.  Feet warm.  Labs significant for BNP  on admission as noted 190 and on August 27 was 175, magnesium 2.1, BUN  creatinine of 44 and 1.6, potassium of 4.6, glucose 145.  Hemoglobin of  14, WBC of 8, platelets of 231.  TSH of 0.692.   STUDIES:  A 12-lead EKG shows sinus rhythm at reported  80 beats per  minute.  PVCs.  Left anterior fascicular block.  LVH with repolarization  abnormalities.   The patient is an 77 year old with multiple medical problems including  ischemic cardiomyopathy, CAD and pulmonary fibrosis.  Admitted with  increased dyspnea, treated with IV Lasix and increase steroids.  Note  BNP on admission was only 190.  Exam significant for mild increased BP.   Since admission, he is noted on telemetry to be in sinus rhythm and  ventricular bigeminy.  The PVCs may be contributing some to shortness of  breath plus some volume increase.  I have discussed with Berton Mount.  He would recommend increasing atrial rate to 80 beats per minute during  day to see if this suppresses ventricular ectopy and put on the sleep  mode at night at 55-60 beats per minute to avoid tachycardia induced  changes.  Follow symptoms, watch renal function, avoid overdiuresis.  Would probably switch him to p.o. Lasix today daily.  Watch BP, a little  high.   I have reviewed possible other drug therapies for ectopy, but no other  drug is optimal with his medical conditions.  Will continue to follow.      Pricilla Riffle, MD, [facility]  Electronically Signed     PVR/MEDQ  D:  05/06/2007  T:  05/07/2007  Job:  [id number]

## 2011-01-21 NOTE — Assessment & Plan Note (Signed)
[facility]                             PULMONARY OFFICE NOTE   [name], [name]                         MRN:          [id number]  DATE:01/11/2007                            DOB:          10/15/1922    PULMONARY FOLLOWUP VISIT   I saw [name] today in followup today for his pauci-immune vasculitis  and pneumonia.   He says he is doing quite well with regard to his symptoms and is not  having any more coughing or hemoptysis.  He is not having any difficulty  with fevers or sweats.  He denies having any chest pains.  He still has  some degree of dyspnea with exertion but says that he feels his activity  level has improved.   His medications currently are:  1. Prednisone 10 mg daily.  2. Cozaar 100 mg daily.  3. Coenzyme Q10 once daily.  4. Lipitor once daily.   PHYSICAL EXAMINATION:  He is 170 pounds.  Temperature is 97.5, blood  pressure is 124/78, heart rate is 77, oxygen saturation is 95% on two  liters.  HEENT:  There is no sinus tenderness, no oral lesion.  HEART:  S1, S2 rate and rhythm.  CHEST:  He had no wheezing or rales.  ABDOMEN:  Soft, nontender.  EXTREMITIES:  No edema.  CHEST X-RAY:  In my office today showed improvement in his bibasal  infiltrates but some residual infiltrate was still left.  On ambulatory  pulse oximetry, his baseline oxygen saturation on room air was 93%, and  his heart rate was 79%.  After walking three laps, his oxygen saturation  decreased to 87 with an increase in his heart rate to 106.   IMPRESSION:  1. Pauci-immune vasculitis.  I will decrease the dose of his      prednisone to 7.5 mg for two weeks, and then if he is able to      tolerate this, to decrease it further to 5 mg daily.  2. Hemoptysis and pneumonia.  He appears to have improved both      symptomatically and radiographically, and I would follow up with      imaging studies at his next visit.  3. Hypoxemia.  He continues to need  supplemental oxygen, particularly      with exercising and at night, and I would continue him on his      current setting of 2 liters by nasal cannula.  4. History of obstructive sleep apnea.  I will reassess this at his      next followup and determine if he would need to have any further      intervention for this.   I will follow up with him in approximately one month.     [name] Helling, MD  Electronically Signed    VS/MedQ  DD: 01/13/2007  DT: 01/13/2007  Job #: [id number]

## 2011-01-21 NOTE — Assessment & Plan Note (Signed)
[facility]                            CARDIOLOGY OFFICE NOTE   NAME:[name], [name]                       MRN:          [id number]  DATE:05/04/2008                            DOB:          Mar 24, 1923    [name] is a gentleman who is 77 years old and has a history of  coronary artery disease status post coronary artery bypass and graft,  ischemia, cardiomyopathy, combined systolic/diastolic congestive heart  failure, history of pacemaker placement, and pulmonary  fibrosis/vasculitis.  Since I last saw him, he continues to have dyspnea  on exertion.  There is no orthopnea or PND, but there is pedal edema.  He states this worsened in the right leg compared to the left and there  has been some pain in his right lower extremity as well.  He has not had  chest pain, palpitations, or syncope.   MEDICATIONS:  1. Vitamin E.  2. Cozaar 25 mg p.o. daily.  3. Lutein 20 mg p.o. daily.  4. Bilberry.  5. Prednisone 7.5 mg p.o. daily.  6. Lasix 80 mg p.o. b.i.d.  7. Potassium 20 mg 2 in the morning and 1 in the evening.  8. Lipitor 80 mg p.o. daily.  9. Coenzyme Q.  10.Coreg 12.5 mg p.o. b.i.d.  11.Actonel.  12.Vitamin D.  13.Calcium.  14.Vitamin C.   Physical exam today shows a blood pressure of 124/71, his pulse is 75.  He weighs 182 pounds.  His HEENT is normal.  His neck is supple.  His  chest shows dry crackles at the bases.  His cardiovascular exam is  regular.  His abdominal exam shows no tenderness.  His extremities show  1 to 2+ edema in the right lower extremity.  There is some tenderness to  palpation in the right calf.   His electrocardiogram shows atrial pacing.  There is left anterior  fascicular block.  A prior anterior infarct cannot be excluded.  There  is lateral T-wave inversion.   DIAGNOSES:  1. Coronary artery disease status post coronary artery bypass and      graft - The patient is doing reasonably well from symptomatic  standpoint.  We will continue with his beta-blocker, ARB, and      statin.  He is not on aspirin due to his history of hemoptysis.  2. Acute-on-chronic combined systolic and diastolic heart failure - He      appears to be euvolemic on exam.  We will continue his Lasix at 80      mg p.o. b.i.d. and we will check basic metabolic panel today.      Note, he did have an echocardiogram on March 13, 2008, which showed      an ejection fraction of 50%.  There was mild aortic insufficiency      and the left atrium was mildly dilated.  3. Hyperlipidemia - Continue on statin.  4. Dyspnea - This is most likely secondary to a combination of      congestive heart failure superimposed on pulmonary fibrosis.  5. Status post pacemaker placement.  6. Pulmonary fibrosis/vasculitis -  Managed per Pulmonary.  7. History of ACE inhibitor-induced cough.  8. Mild cerebrovascular disease.  9. Hypertension - His blood pressure is adequately controlled.  10.History of renal insufficiency.   We will see him back in 6 months.   ADDENDUM:  The patient is also complaining of pain and swelling in his  right lower extremity.  We will schedule him to have venous Dopplers to  exclude DVT.     Madolyn Frieze Jens Som, MD, [facility]  Electronically Signed    BSC/MedQ  DD: 05/04/2008  DT: 05/05/2008  Job #: [id number]

## 2011-01-21 NOTE — Assessment & Plan Note (Signed)
[facility]                            CARDIOLOGY OFFICE NOTE   NAME:[name], [name]                       MRN:          [id number]  DATE:01/08/2007                            DOB:          1923/05/04    [name] is a pleasant gentleman who has a history of coronary disease  status post coronary artery bypass and graft as well as bradycardia  status post pacemaker placement. Since I last saw him, [name] had an  episode of pneumonia and pauci-immune vasculitis of the lungs with  pulmonary fibrosis. He had hemoptysis and was admitted to [facility]. He was treated with antibiotics and steroids and his aspirin  was discontinued. Since discharge, he is slowly improving although he is  on oxygen. There is mild dyspnea on exertion but there is no orthopnea,  PND, pedal edema, palpitations, presyncope, syncope or chest pain.   MEDICATIONS:  1. Lipitor 80 mg p.o. daily.  2. Cozaar 100 mg p.o. daily.  3. Coenzyme Q.  4. Prednisone.   PHYSICAL EXAMINATION:  VITAL SIGNS:  Blood pressure 135/57 and his pulse  is 43. He weighs 171 pounds.  NECK:  Supple with no bruits.  CHEST:  Clear.  CARDIOVASCULAR:  Reveals a regular rate and rhythm.  EXTREMITIES:  Show no edema.   Electrocardiogram shows sinus rhythm with ventricular bigeminy. There is  left ventricular hypertrophy with QRS widening. There is a left anterior  fascicular block.   DIAGNOSES:  1. Coronary artery disease status post coronary artery bypass and      graft - we will continue with his statin, ARB. I have asked him to      discuss his aspirin with the pulmonologist at their next visit.      This was discontinued secondary to his hemoptysis with his recent      vasculitis/pneumonia. When they feel comfortable, I would like to      resume that for his coronary disease. He is not on the aspirin at      present due to his recent hemoptysis.  2. Hypertension - his blood pressure is  well-controlled on his present      medications. We will check a BMET and follow his potassium and      renal function.  3. Hyperlipidemia - we will check lipids and liver and adjust as      indicated.  4. Pulmonary infiltrate secondary to immune vasculitis - per      pulmonary.  5. History of partial nephrectomy secondary to renal cell carcinoma      and renal insufficiency.  6. History of prostate cancer.  7. History of cough with ace inhibition.  8. History of bronchiectasis.   I will see him back in 6 months.     Madolyn Frieze Jens Som, MD, [facility]  Electronically Signed    BSC/MedQ  DD: 01/08/2007  DT: 01/08/2007  Job #: [id number]

## 2011-01-24 ENCOUNTER — Other Ambulatory Visit: Payer: Self-pay | Admitting: Cardiology

## 2011-01-24 ENCOUNTER — Ambulatory Visit (INDEPENDENT_AMBULATORY_CARE_PROVIDER_SITE_OTHER): Payer: Medicare Other | Admitting: Adult Health

## 2011-01-24 ENCOUNTER — Encounter: Payer: Self-pay | Admitting: Adult Health

## 2011-01-24 VITALS — BP 124/70 | HR 91 | Temp 97.1°F | Ht 66.0 in | Wt 154.2 lb

## 2011-01-24 DIAGNOSIS — J209 Acute bronchitis, unspecified: Secondary | ICD-10-CM

## 2011-01-24 MED ORDER — CEFDINIR 300 MG PO CAPS: 300.0000 mg | ORAL_CAPSULE | Freq: Two times a day (BID) | ORAL | Status: AC

## 2011-01-24 NOTE — H&P (Signed)
NAME:  [name], [name]                ACCOUNT NO.:  [id number]   MEDICAL RECORD NO.:  [id number]          PATIENT TYPE:  INP   LOCATION:  1826                         FACILITY:  [facility]   PHYSICIAN:  [name] Rexene Edison. Swords, MD    DATE OF BIRTH:  Aug 25, 1923   DATE OF ADMISSION:  12/13/2006  DATE OF DISCHARGE:                              HISTORY & PHYSICAL   CHIEF COMPLAINT:  Shortness of breath, hemoptysis.   HISTORY OF PRESENT ILLNESS:  [name] is an 77 year old gentleman who  has a long history of pulmonary disease.  He has been seen recently by  Dr. Delford Field, Dr. Craige Cotta, Dr. Jayme Cloud.  Over the past 1-2 weeks he has been  treated with a Z-Pak and then Levaquin for shortness of breath, cough,  hemoptysis.  He has also had an abnormal chest x-ray with bilateral  infiltrates.  He otherwise feels weak but no other specific complaints.  He denies any fevers or chills.   PAST MEDICAL HISTORY:  His past medical history is significant for pauci-  immune vasculitis of the lungs.  There is some resultant fibrosis.  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   MEDICATIONS:  1. Lipitor 80 mg daily.  2. Cozaar 50 mg daily.  3. Aspirin 81 mg daily.  4. CoQ10 daily.  5. Multivitamin daily.  6. Takes vitamin C and vitamin E daily.   ALLERGIES:  PENICILLIN.  HE HAS DEVELOPED A COUGH WITH AN ACE INHIBITOR.   SOCIAL HISTORY:  He is a nonsmoker.   FAMILY HISTORY:  Noncontributory.   REVIEW OF SYSTEMS:  He denies any chest pain, PND, orthopnea.  He denies  any abdominal pain, change in appetite, GI blood loss, dysuria,  hematuria, __________  or any complaints on a complete review of  systems.   PHYSICAL EXAMINATION:   VITAL SIGNS:  Temperature 98.5, pulse oximetry  87% on room air, 94% on 2 liters of oxygen, respirations 16, blood  pressure 139/66, heart rate 106.  GENERAL:  He appears as a well-developed, well-nourished male in no  acute distress.  He appears younger than his stated age of 35.  HEENT EXAM:  Atraumatic, normocephalic.  Extraocular muscles on the  right are intact.  He has an enucleated left eye with a prosthesis.  Oropharynx is moist.  NECK:  Supple without lymphadenopathy, thyromegaly, jugular venous  distention or carotid bruits.  CHEST:  Clear to auscultation without any increased work of breathing.  He has bilateral rhonchi throughout, fremitus throughout.  There is no  specific dullness to percussion.  CARDIAC EXAM:  S1 and S2 are regular with a heart rate of 98 on my exam.  No significant gallop.  ABDOMINAL EXAM:  Soft, active bowel sounds, nontender, nondistended, no  masses.  EXTREMITIES:  There is no clubbing, cyanosis, or edema.  NEUROLOGICAL EXAM:  He is alert and oriented without any motor or  sensory deficits.  He moves all four extremities.  Cranial nerves seem  to be intact.   LABORATORIES:  Creatinine is 1.6, ProTime is normal at 1.1, BNP is  elevated at 278.  CBC with a hemoglobin of 12.7, white count 13.3,  platelet count 422,000.  Blood cultures were drawn.   ASSESSMENT AND PLAN:  1. Pauci-immune vasculitis with changing chest x-ray.  A chest x-ray      was repeated in the emergency department and actually has improved      over the chest x-ray of December 11, 2006.  He continues to have      hemoptysis.  We will continue to treat aggressively with oral      antibiotics (Levaquin seems to be doing the job).  We will add Solu-      Medrol intravenous.  Patient may need a pulmonary consult if      hemoptysis does not improve.  We will hold aspirin.  There is a      note in Dr. Lynelle Doctor last office visit that a consideration to      bronchoscopy may need to be  given.  2. Renal insufficiency.  I am not sure if this is new or old.  It      seems mild.  We will recheck tomorrow.  3. Elevated white count.  Suspect related to oral steroids.  We will      just follow that for now, with increasing steroids white count may      go higher.  4. Coronary artery disease seems to be stable.  5. Hyperlipidemia.  Continue Levaquin.  6. Chronic aspirin use.  We will hold.  7. Hypertension.  Currently well controlled.  We will continue Cozaar.  8. Obstructive sleep apnea.  Currently is not being treated.  He is      being evaluated and treated for this by Dr. Craige Cotta.  9. History of renal cell carcinoma status post partial left      nephrectomy.  Does not seem to be an active problem.  10.Complete heart block status post pacemaker placement.      [name] Rexene Edison Swords, MD  Electronically Signed     BHS/MEDQ  D:  12/13/2006  T:  12/13/2006  Job:  [id number]   cc:   Gailen Shelter, MD

## 2011-01-24 NOTE — Consult Note (Signed)
[place]  Patient:    [name], [name]                         MRN: [id number] Proc. Date: 11/26/00 Adm. Date:  [id number] Attending:  Dolores Patty CC:         Claretta Fraise, M.D.   Consultation Report  HISTORY OF PRESENT ILLNESS:  This is a 77 year old, married white male from [place], [place] admitted on 11/22/00, with a two week history of hemoptysis and pneumonia, treated with Ceftin, and subsequently Cipro.  The patient did develop polyarthralgias, with right elbow and right upper extremity pain, worsening with movement.  In the emergency room, the patient was barely able to raise his head.  His symptoms resolved overnight, but then reoccurred over the next 36 hours in the left upper extremity and elbow.  The patient also complained of pain in the soles of his feet causing immobilization and limping.  The patient was admitted with rheumatology evaluation, and abdominal CT and ultrasound was accomplished.  This showed a 17 mm left lower pole exophytic renal mass, appearing solid, possibly complex cystic in nature.  Patient is now for urology consultation.  PAST MEDICAL HISTORY: 1. TURP (remote history - ? MD). 2. Left eye malignancy - H5. 3. Septoplasty. 4. Right tympanoplasty - removed. 5. Right ear deafness. 6. Left tympanic membrane implantation. 7. Coronary artery bypass graft 2001. 8. Gastroesophageal reflux disease.  ALLERGIES:  PENICILLIN.  REVIEW OF SYSTEMS: 1. Hemoptysis. 2. No history of tick bite, no history of heavy metal exposure.  SOCIAL HISTORY:  Patient is an Technical sales engineer.  Tobacco is none.  Alcohol is none.  FAMILY HISTORY:  Father - MI and TB; Mother - colon cancer.  MEDICATIONS: 1. Altace 5 mg p.o. q.d. 2. Protonix 40 mg p.o. q.d. 3. Celebrex 200 mg q.d. 4. Ambien 5 mg p.o. h.s. 5. Tylenol p.r.n.  PHYSICAL EXAMINATION:  Admission physical examination shows a thin, white male in no acute  distress.  VITAL SIGNS:  Temperature 97.1, blood pressure 133/62, pulse 97, respirations 20.  GENERAL: A thin, white male in no acute distress - left eye scarring is apparent. He is alert and oriented.  CHEST:  Clear to auscultation and percussion with decreased breath sounds with no wheezing.  CORE:  S1, S2, normal.  ABDOMEN: Soft.  Positive bowel sounds.  No organomegaly, without masses.  GENITALIA:  Normal descending testes bilaterally.  Patient is uncircumcised. The penis, vas, epididymis are all within normal limits.  RECTAL:  Shows normal sphincter tone.  Prostate is 3+ and lobular and benign. There are no masses, and no blood.  EXTREMITIES:  Without cyanosis, clubbing or edema.  NEUROLOGIC:  Is physiologic.  LABORATORY DATA:  Admitting lab data:  Urinalysis shows 6-10 red blood cells and 0-5 white blood cells.  PSA 2.673, C3 C4 negative.  Sed rate 88; TSH 1.9 (normal);  PSA 2.67; white blood cells 14,900 on 3/17; hemoglobin 11.4, hematocrit 33.0; sodium 131, potassium 3.6, chloride 94, CO2 29, BUN 17, creatinine 1.1, calcium 8.8, albumin 2.2 with total protein of 6.2.  Chest x-ray shows patchy perihilar densities, right greater than left.  CT scan shows a 1.7 cm mass (complex cyst versus renal cell carcinoma).  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  We will plan biopsy in the morning.  If positive, then maybe a candidate for "  lumpectomy" to salvage kidney, assuming margins are negative.  If inconclusive biopsy, then he maybe a candidate for MRI.  PLAN:  Will schedule biopsy for a.m. DD:  11/26/00 TD:  11/27/00 Job: [id number]

## 2011-01-24 NOTE — H&P (Signed)
NAME:  [name], [name] NO.:  [id number]   MEDICAL RECORD NO.:  [id number]                   PATIENT TYPE:  EMS   LOCATION:  [facility]                                 FACILITY:  [facility]   PHYSICIAN:  Vida Roller, M.D.                DATE OF BIRTH:  1923-06-18   DATE OF ADMISSION:  04/19/2004  DATE OF DISCHARGE:                                HISTORY & PHYSICAL   PRIMARY CARE [name]:  [name], M.D.   CARDIOLOGIST:  [name], M.D. [name]   HISTORY OF PRESENT ILLNESS:  [name] is a 77 year old gentleman who has a  history of coronary artery disease, status post non-Q-wave myocardial  infarction in February 2001 which was complicated by left main coronary  artery disease with mildly depressed left ventricular systolic function on  heart catheterization. He underwent bypass surgery for that and has done  well since then until this evening when he noticed the onset of discomfort  in the center of his chest without any significant radiation. It began about  midnight when he was trying to go to sleep. It was associated with some mild  diaphoresis and shortness of breath. He had been feeling poorly in the days  prior to this and had gone to the urgent care.  He had been evaluated and  found to have what was thought to be bronchitis and he was treated with  antibiotics. I think he got a Z-Pak and was just finishing the last of  those. He noticed some discomfort in his chest which did not resolve with a  single sublingual nitroglycerin. He came to the emergency department and by  the time he came to the emergency department  he was pain free and we are  asked to evaluate him. He denies any PND or orthopnea. No nausea, vomiting,  fever, chills, or nightsweats. He denies any syncope or claudication.   PAST MEDICAL HISTORY:  Significant for a tumor in his left eye as a child  which was treated with enucleation and radiation therapy. He has had nasal  surgery in the past. He has also had a TURP. He is also status post left  partial nephrectomy for cancer.   SOCIAL HISTORY:  His wife recently passed away. He lives by himself.  He  denies any tobacco or alcohol use. He does not use any drugs. He is a  retired Technical sales engineer.   FAMILY HISTORY:  Noncontributory.   REVIEW OF SYSTEMS:  He denies any bright red blood per rectum, melena,  hematochezia, or any change in his bowel habits. He has seen no blood in his  urine. He does have some knee discomfort secondary to arthritis. He denies  any nausea, vomiting, diarrhea, or constipation. No problems swallowing. He  does say occasionally he will get GERD symptoms. He denies any weight loss  or neurologic symptoms and the rest of his review  of systems is generally  negative.   PHYSICAL EXAMINATION:  GENERAL: He is a well-developed, well-nourished, very  articulate, and pleasant man who is in no apparent distress, alert and  oriented.  VITAL SIGNS: His blood pressure is 143/72, pulse 75 and regular,  respirations 18, and he is afebrile.  HEENT: Significant for the prosthesis in the left eye.  NECK: Supple. He has no jugular venous distention. His thyroid is normal  size in the midline. His trachea is also in the midline. He does have faint  bilateral carotid bruits.  LUNGS: Clear to auscultation bilaterally.  CARDIOVASCULAR: Regular with normal first and second heart sounds. There is  no third or fourth heart sounds and there are no murmurs.  ABDOMEN: Soft, nontender with normoactive bowel sounds.  EXTREMITIES: Without clubbing, cyanosis, or edema. His pulses are all 2+  throughout. He has no significant bruits in his pulses.  NEUROLOGIC: Nonfocal.   Electrocardiogram reveals sinus rhythm at a rate of 81 with normal axis,  mild left axis deviation at -17. He has no ST-T wave changes concerning for  ischemia and no Q waves concerning for an old myocardial infarction. He does  have nonspecific  ST changes, however.   His white blood cell count is 7.3. H&H 13.5 and 39, platelet count 280,000.  Sodium 141, potassium 3.7, chloride 111, bicarbonate 22, BUN 19, creatinine  1.3, and his blood sugar is 105.  Liver function tests are within normal  limits and his first set of point of care enzymes is negative.   ASSESSMENT:  1. This is a gentleman with atypical chest discomfort, but  with known     coronary artery disease.  2. Status post coronary artery bypass grafting in 2001 where he received a     left internal mammary artery to his left anterior descending, saphenous     vein graft to the circumflex, saphenous vein graft to the first diagonal,     and saphenous vein graft to the posterior descending.  3. Left eye tumor, status post enucleation and prosthesis, and x-ray     therapy.  4. Renal cell carcinoma, status post left nephrectomy.  5. Medical noncompliance, specifically he stopped taking his aspirin.   PLAN:  Admit him, cycle his enzymes, restart his aspirin, check a chest x-  ray, and if his enzymes are negative he can probably have an outpatient  cardiac evaluation in our office in [place] with Card[facility]. If his  enzymes are positive he will need a heart catheterization.                                                Vida Roller, M.D.    JH/MEDQ  D:  04/19/2004  T:  04/19/2004  Job:  [id number]

## 2011-01-24 NOTE — Assessment & Plan Note (Signed)
[facility]                             PULMONARY OFFICE NOTE   NAME:Hoak, GERSHOM BROBECK                       MRN:          [id number]  DATE:11/10/2006                            DOB:          1922/09/14    I saw Mr. Aigner today for followup of his sleep apnea with a positional  component.   He still is quite symptomatic with his symptoms of daytime sleepiness,  in spite of attempting positional therapy and he is considering actually  undergoing further therapy for his sleep apnea.  I reviewed the various  treatment options for this, including C-PAP therapy, oral appliance, and  surgical intervention.  He would like to think about these options and  then notify me about is decision at a later date.  I will tentatively  schedule him to follow up with me in 6-8 weeks to review this, but I  advised him that if he decides earlier that he can call to proceed with  further therapy.     Coralyn Helling, MD  Electronically Signed    VS/MedQ  DD: 11/10/2006  DT: 11/10/2006  Job #: [id number]   cc:   Gailen Shelter, MD  Madolyn Frieze. Jens Som, MD, [facility]

## 2011-01-24 NOTE — Assessment & Plan Note (Signed)
[facility]                         ELECTROPHYSIOLOGY OFFICE NOTE   NAME:DAVISCruise, Baumgardner                       MRN:          [id number]  DATE:09/03/2006                            DOB:          July 04, 1923    Mr. Peddie was seen today in the clinic on September 03, 2006, for a wound  check of his newly-implanted Medtronic model number [id number] EnRhythm.  Date of implant was August 11, 2006, for secondary heart block and  ischemic cardiomyopathy.  On interrogation of his device today, his  battery voltage is 3.09.  P waves measured 1.9 millivolts with an atrial  capture threshold of 1 volt at 0.4 milliseconds and an atrial lead  impedance of 520 ohms.  R waves measured 8.8 millivolts with a  ventricular pacing threshold of 0.5 volts at 0.4 milliseconds and a  ventricular lead impedance of 656 ohms.  There were no episodes noted.  No changes were made in his parameters.  His Steri-Strips were removed  today.  His wound was without redness or edema.  He is complaining of  some lower extremity edema which both feet and ankles are involved in,  and we will discuss this with Dr. Jens Som who is in the [place]  office this morning, will be back in [place] this afternoon, and have  him follow up.      Altha Harm, LPN  Electronically Signed      Duke Salvia, MD, [facility]  Electronically Signed   PO/MedQ  DD: 09/03/2006  DT: 09/03/2006  Job #: [id number]

## 2011-01-24 NOTE — H&P (Signed)
[place]  Patient:    [name], [name]                         MRN: [id number] Adm. Date:  [id number] Attending:  Dolores Patty CC:         Dr. Claretta Fraise at [facility]  Dr. Doug Sou / Doristine Counter P.A., [facility]   History and Physical  HISTORY OF PRESENT ILLNESS:  [name] is a 77 year old white male admitted with a convoluted history manifested by an indolent but progressive arthralgia, profound fatigue, with impairment of gait.  His symptoms began approximately two weeks ago when he was diagnosed as having pneumonia by Dr. Baldo Daub and was placed on Ceftin.  After one week of therapy, he was changed to Cipro as there was suboptimal improvement on the chest x-rays.   The patient is allergic to PENICILLIN.  He was placed on Cipro on March 11, and March 12 and March 13 he began to experience arthralgia-type pain.  This was manifested as pain from the elbow down in the right upper extremity, worse with movement.  It was to the point that he could barely raise his arm to his head.  It resolved overnight but recurred in the next 24-36 hours in the left upper extremity from the elbow down.  The elbow had improved; he continues to have some persistent discomfort in the knuckles.  On March 15, he began to have severe pain in the soles of his feet, as if "walking on rocks"  which caused him to limp and significantly impaired his ability to mobilize.  His gait has been effected to the point that he "must hold on to things" to prevent falls.  He has had intermittent streaky hemoptysis and purulent sputum during this time and he has had some sweats.  Initially he had fever.  PAST MEDICAL HISTORY:  Includes bypass grafting in February 2001.  At age 14 he had a malignant growth of the left eye which was treated initially with a radium pledget placed over the eye, and subsequent surgical removal.  He  has also had a TURP.  He has also had septoplasty and surgery on the right tympanic membrane.  FAMILY HISTORY:  Includes myocardial infarction in his father and cancer of the colon in his mother.  There are no arthralgias, arthritis, or musculoskeletal problems in the family.  He has had some hemoptysis related to sinusitis in the past.  PRESENT MEDICATIONS:  Cipro, Altace, folic acid, Pravachol, Muco-Fen, and Nexium for esophageal reflux.  PHYSICAL EXAMINATION:  GENERAL:  He appears somewhat chronically ill and weak.  VITAL SIGNS:  Temperature 97.1, blood pressure 133/62, pulse 97, respiratory rate 20, O2 saturation 96% on room air.  HEENT:  His hair is stark white.  There is no nuchal rigidity.  The left eye is prosthetic.  Arteriolar narrowing is noted on the right.  There is erythema of the left otic canal and oropharynx.  Tongue is somewhat dry.  LYMPH:  He has no lymphadenopathy about the head, neck, or axilla.  LUNGS:  He has mild rales at the bases but no increased work of breathing.  An S4 is noted with a harsh rumble along the left sternal border.  ABDOMEN:  There is dullness to the right upper quadrant but no definite organomegaly.  Femoral pulses are intact.  SKIN:  There are operative scars over the chest and right  lower extremity.  EXTREMITIES:  There is an equivocal Homans of the calf.  I do not appreciate diffuse muscle tenderness.  There is no rash.  He has pain with flexion of the knee.  LABORATORY DATA:  EKG reveals nonspecific T changes with flat T in I and slightly everted in lead aVL.  There is no sign of ischemia or pericarditis.  His white blood count is 14,900; hematocrit 33.  Glucose 120, sodium 130. Urinalysis reveals 6-10 red cells with few bacteria.  PLAN:  The patient is going to be admitted as he is having failure-to-thrive phenomenon in the context of pneumonia present for two weeks and apparently only partial response to two different  antibiotics.  He has arthralgias and a gait dysfunction which has been visualized.  Although there are no specific joint findings and no tenderness over the feet, there may be a more serious issue such as Guillain Barre.  Pending is a sed rate.  Additionally, although he is tender only over the calves, his symptoms could be related to his Pravachol therapy.  Pending is a CPK and MB and sed rate.  He will be monitored off the Cipro.  A spiral CT may be necessary if x-rays are nondiagnostic and hemoptysis persists.  The admission is warranted in view of his prolonged and progressive debilitation with limited reserve, in view of his underlying coronary artery disease. DD:  11/22/00 TD:  11/23/00 Job: [id number]

## 2011-01-24 NOTE — Consult Note (Signed)
[place]. [facility]  Patient:    [name], [name]                         MRN: [id number] Proc. Date: 10/14/99 Adm. Date:  [id number] Disc. Date: [id number] Attending:  Waldo Laine CC:         Daisey Must, M.D. [facility]             Edward B. Tyrone Sage, M.D.                          Consultation Report  REASON FOR CONSULTATION:  Three-vessel coronary occlusive disease with recent myocardial infarction requested by Dr. Gerri Spore.  HISTORY OF PRESENT ILLNESS:  The patient is a 77 year old male who had had at least three for four episodes of increasing anginal symptoms with chest discomfort and a burning sensation that radiated into the neck but without radiation into the arm. Because of these symptoms, he underwent a stress test on September 24, 1999, by Dr. Gerri Spore.  This was an abnormal stress test with good exercise tolerance with both electrocardiographic and sonographic evidence of myocardial ischemia.  A cardiac catheterization was recommended at that time; however, the patient declined.  However, on February 3 during the night, he had recurrent more severe episode of chest burning which radiated into the left arm and was not relieved s his previous episodes had, so he came to the emergency room by private vehicle.  EKG there showed ST depression, inferiorly and anteriorly.  He was stabilized medially.  Troponins and CK-MBs were elevated, and he was transferred to [facility] today for cardiac catheterization which demonstrated severe three-vessel coronary artery disease.  The patient is referred for cardiac catheterization.  PAST MEDICAL HISTORY:  ALLERGIES:  PENICILLIN which causes a rash.  MEDICATIONS AT TIME OF ADMISSION:  Included a beta blocker, but he was not sure of the name or the type, 1 aspirin a day, and sublingual nitroglycerin p.r.n.  MEDICAL: The patient denies any previous history of hypertension,  diabetes, thyroid problems.  He has had no previous surgery with the exception of enucleation of he left eye.  He had a malignant tumor treated with radium in his left eye at a young age.  REVIEW OF SYSTEMS:  Overall, the patient had had no constitutional symptoms. He has had no fever, chills, weight loss.  He denies any previous TIAs or stroke. He has had no thyroid problems.  He does not smoke and has had no pulmonary problems in the past.  He denies hemoptysis.  He has had symptoms of reflux esophagitis nd was being evaluated for this.  It is unclear if these symptoms were really his angina or true reflux symptoms.  He has had no blood in his stool, but he has had a colonoscopy in November of this year and was told there was nothing there.  The  patient has had urinary tract symptoms in the past, no hematuria, but has had urinary hesitancy.  He noted he had a TURP many years ago by Dr. Patsi Sears. He denies any hip or calf claudication.  He denies any recent skin changes.  He denies any psychiatric history.  PHYSICAL EXAMINATION:  VITAL SIGNS:  Blood pressure 110/70, sinus rhythm at 62, respiratory rate 18.  GENERAL:  The patient is awake and alert and able to relate his history without  difficulty.  NECK:  He has faint bilateral carotid bruits.  Doppler studies were done after his was noted and did not reveal any critical stenosis.  He has no jugular venous distension.  He has no palpable masses in the neck.  LUNGS:  Clear bilaterally.  HEART:  Normal S1 and S2 without murmurs or gallops.  PMI is not displaced.  ABDOMEN:  Without tenderness.  He has positive bowel sounds.  The aortic caliber by palpation is normal.  EXTREMITIES:  Lower extremities reveal 2+ DP and PT pulses bilaterally.  He has  adequate vein in both lower extremities for bypass.  NEUROLOGIC:  Overall neurologic exam is intact.  LABORATORY DATA:  Cardiac catheterization was done which  revealed preserved LV function.  Catheterization shows 50% left main, 95% proximal circumflex, 90% proximal LAD, complex long lesion involving a diagonal, 80% sequential lesions n the right coronary artery.  IMPRESSION:  Severe three-vessel coronary artery disease with recent myocardial  infarction.  Agree with recommendation for coronary artery bypass grafting.  I ave discussed the risks and options with the patient and his family in detail including the risk of death, infection, stroke, myocardial infarction, bleeding, and blood transfusion.  The patient has had his questions answered and is willing to proceed. With the critical nature of his disease, we will plan to proceed February 6 in he a.m.  The patient is agreeable with this approach. DD:  10/14/99 TD:  10/14/99 Job: [id number]

## 2011-01-24 NOTE — Consult Note (Signed)
[place]  Patient:    [name], [name]                         MRN: [id number] Proc. Date: 11/24/00 Adm. Date:  11/22/00 Attending:  Pollyann Savoy, M.D.                          Consultation Report  REASON FOR CONSULTATION:  I have been asked to do a rheumatology consult by Dr. Alwyn Ren on [name] for evaluation of joint swelling.  HISTORY OF PRESENT ILLNESS:  [name] is a 77 year old, Caucasian male.  He presented two weeks ago with history of fatigue, vertigo.  At that time he was seen by Dr. ___________ at the office and a chest x-ray was obtained which showed right lower lobe infiltrate.  He was placed on Ceftin which he took for a week without good result.  At that time he was switched to ciprofloxacin. According to him, ciprofloxacin was started on November 16, 2000.  The following day he developed pain in his right forearm and right elbow.  It was to the extent that he was having difficulty feeding himself.  He tried some topical creams without much help, but two days later the pain resolved and he started having pain in his left arm and left hand.  He remembered having swelling in his left MCP area also.  He tried some topical cream again without help.  In the next two days the swelling resolved but he developed pain and swelling over the bottom of his feet.  He was having difficulty walking and was unable to even stand.  On March 17 he was hospitalized with a history of fatigue, decreased appetite, and pain in his bilateral feet.  At that time it was felt to be a drug reaction from ciprofloxacin, and ciprofloxacin was discontinued. He also gave history of hemoptysis and productive cough.  He initially had subjective fever and night sweats, but none during the hospitalization. During the hospital stay he was started on Celebrex and Lasix, as the chest x-ray was suspicious for pulmonary edema.  He continued his medications of Altace and  Protonix.  He also recalls that he had occasional abdominal pain during the last 4-5 days which has resolved by now.  He denies any history of any rashes, oral ulcers, nasal ulcers, Raynauds phenomenon.  He gives history of ___________ symptoms for several years and also gives history of frequent sinusitis.  In fact, he had seen ENT physician for his sinusitis in the past. He has also had problems with right ear deafness and left ear deafness off and on.  He recalls seeing ENT physician 1 year ago.  At that time he was diagnosed with frontal sinusitis and treated with antibiotics and steroids. At that time his deafness resolved.  He denies any history of recent travel, tick bites, or any recent rash.  He denies any exposure to any infections.  PAST MEDICAL HISTORY:  Remarkable for coronary artery disease.  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  FAMILY HISTORY:  His father had history of myocardial infarction.  There is a history of colon cancer in his mother and also history of tuberculosis in his father.  SOCIAL HISTORY:  He works as an Technical sales engineer.  He is a nonsmoker.  Does not drink any alcohol and usually walks two miles per day for exercise.  MEDICATIONS:  1. Altace 5 mg p.o. q.d.  2. Protonix 40 mg p.o. q.d.  3. Celebrex 200 mg p.o. q.d.  4. Ambien 5 mg p.o. q.h.s.  5. Tylenol 650 mg p.r.n.  PHYSICAL EXAMINATION:  GENERAL:  The patient is in no acute distress although he appears quite tired and fatigued.  VITAL SIGNS:  Temperature 98.3, heart rate 80, respiratory rate 20, blood pressure 106/60.  HEENT:  Normocephalic.  The right pupil is reactive to light.  Left eye prosthesis.  There is no evidence of conjunctivitis or ocular inflammation. No evidence of oral ulcers, nasal ulcers, or maxillary sinus tenderness. There  was no evidence of frontal sinus tenderness or temporal artery tenderness.  LUNGS:  Bilateral breath sounds were present.  There were a few rales present in the bases.  CARDIOVASCULAR:  Regular rate and rhythm without murmur, gallop, or rub. Surgical scar was noted on the chest.  ABDOMEN:  Soft.  Bowel sounds present.  No tenderness.  No mass.  EXTREMITIES:  There was no evidence of pedal edema.  Pulses were 4+ all over. Muscular exam:  No muscle weakness.  There was some tenderness over the calf muscles bilaterally.  NEUROLOGIC:  Nonfocal.  He was oriented x 3.  SKIN:  No evidence of any rash.  MUSCULOSKELETAL:  All joints with full range of motion.  He had painful range of motion of bilateral knee joints which were warm to touch, but no evidence of any effusion.  C-spine had limited lateralization, but not painful.  The rest of the joints had full range of motion with no evidence of synovitis or synovial thickening.  INVESTIGATIONS:  EKG done on admission showed non-specific T wave changes and no evidence of pericarditis.  Chest x-ray showed perihilar densities, more in the right base than the left.  Labs from November 22, 2000 show WBC count of 14.9, H&H 11.4 and 33, platelets 560; 85% neutrophils and 9% monocytes.  UA showed large hemoglobin.  Microscopic exam showed RBCs 6-10 per high powered field.  BUN and creatinine were normal.  LFTs were normal.  Sedimentation rate was 102.  CPK was 19.  At this time rheumatoid factor and ANA were pending.  RPR was negative and TSH was normal.  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  He also has history of recurrent sinusitis in the past and he has history of hematuria and hemoptysis.  DIFFERENTIAL DIAGNOSES:  1. Post infectious arthritis which could include infectious mononucleosis,     parvovirus, and post streptococcal arthritis,  which can be rheumatic     fever.  2. Can be Lyme disease although unlikely to be migratory polyarthritis and      there is no history of tick bite.  3. Tuberculosis.  There is a positive family history of tuberculosis,     hemoptysis, and night sweats.  4. Septic arthritis, bacterial etiology would be unlikely as most of the time     the arthritis resolves by itself.  5. Autoimmune diseases.  As there is history of recurrent sinusitis,     arthritis and hematuria, we should consider systemic lupus erythematosus,     Wegeners granulomatosis, and _________________ disease in the     differential.  Hypersensitivity reaction to ciprofloxacin also is likely.  At this time I would recommend obtaining  C-ANCA and P-ANCA to look for underlying vasculitis, compliment level C3 and C4 to look for hypersensitivity reaction or compliment consumption.  If level to look for sarcoidosis, Lyme titers, anti-DNAse B to evaluate for recent strep infection.  EBV titers and parvotiters.  We will also place a PPD.  He has no evidence of effusion at this time to aspirate.  I would recommend him to continue with Celebrex 200 mg p.o. q.d. and I agree with renal work-up and CT scan of the chest as planned by Dr. Alwyn Ren.  The knee joint x-ray results are pending at this point.  I will continue to follow while he is in the hospital. DD:  11/24/00 TD:  11/25/00 Job: [id number]

## 2011-01-24 NOTE — Assessment & Plan Note (Signed)
[facility]                               [name], [name]                       MRN:          [id number]  DATE:07/07/2006                            DOB:          May 27, 1923    The patient at the time was seen without his chart and was not available.   HISTORY OF PRESENT ILLNESS:  This is an 77 year old gentleman with  pauciimmune vasculitis with evanescent pulmonary infiltrates maintained on  prednisone 5 mg every other day.  He apparently recently went to urgent care  due to having increased problems with cough.  He apparently had a chest x-  ray which was read as having fluid around his lungs.  The films were  available for my review today via CD.   The patient denies any increased shortness of breath over usual.  His main  difficulty is his cough.  He has really no sputum production and denies any  respiratory problems.  He does, however, have terrible cellulitis of his  right ear.  This is markedly swollen and erythematous and has started  approximately two days ago.  His ear, nose, and throat specialist is Dr.  Pollyann Kennedy.  He is currently on doxycycline that he was given at the urgent care.   The patient again denies any other symptomatology.   CURRENT MEDICATIONS:  As noted on the intake sheet.  These have been  reviewed and are accurate.   PHYSICAL EXAMINATION:  VITAL SIGNS:  Blood pressure 140/86, pulse 84,  temperature 98.4, weight 174 pounds, oxygen saturation is 96% on room air.  GENERAL:  In general this is an elderly white male who is ambulatory.  He is  in no acute distress.  HEENT:  Remarkable for prosthetic left eye.  He also wears hearing aid on  the left.  His right external ear is entirely edematous and erythematous  with the canal completely occluded.  This is consistent with a cellulitis.  Pharynx is clear.  Nasal passages are clear.  NECK:  The patient has no JVD.  LUNGS:  The patient sounds  remarkably clear.  He has very faint, rare Velcro  crackles at the bases which are chronic.  CARDIAC:  Regular rate and rhythm.  No rubs, murmurs, or gallops.  ABDOMEN:  Benign.  EXTREMITIES:  He has no cyanosis, no clubbing,  or edema noted.   LABORATORY DATA:  We did review the films from [facility] and  actually his chest x-ray looked better than during his last visit.  His  pulmonary infiltrates were entirely at baseline and no acute processes  noted.  I reassured the patient of this.   IMPRESSION:  1. Pauciimmune vasculitis with evanescent pulmonary infiltrates.  These      are very much at baseline for the patient with no need for acute      intervention.  The patient does not have pneumonia.  2. The most salient problem today (and the most worrisome) is his      cellulitis of the right ear.  This will need to  be addressed more      aggressively.   PLAN:  1. Plan therefore will be to refer the patient to his ENT, Dr. Pollyann Kennedy.  Our      initial fastest appointment available was on November 12; however, the      patient states that he will call directly as he can usually get in      fairly quickly by calling Dr. Lucky Rathke nurse directly.  I urged him to      do this.  2. We will discontinue doxycycline and place him on Levaquin 500 mg x10      days.  Samples were given to the patient.  He can tolerate this      medication.  3. He will have a prednisone taper.  4. Follow-up will be as previously scheduled here in approximately 4-6      weeks.  He is to see Dr. Pollyann Kennedy, however, within the next few days and      the patient has been advised to go to the emergency room should his      symptoms worsen with regards to his ear.     Gailen Shelter, MD  Electronically Signed    CLG/MedQ  DD: 07/11/2006  DT: 07/11/2006  Job #: [id number]   cc:   Madolyn Frieze. Jens Som, MD, [facility], [place]

## 2011-01-24 NOTE — Assessment & Plan Note (Signed)
[facility]                                 ON-CALL NOTE   NAME:DAVISMelik, Blancett                       MRN:          [id number]  DATE:09/19/2006                            DOB:          1922/11/12    I received a phone call this evening from Mr. Alfed Stordahl' son, Rendell Thivierge, reporting that Mr. Ellwood Steidle' heart rate was down into the  high 40's and low 50's on a home monitor. He had a pacemaker placed a  few weeks ago. The patient is not overly symptomatic and denies any  problems with chest pain, shortness of breath, dizziness, near syncope,  or syncope. His blood pressure is slightly elevated in the 180's as  well. I advised the patient's son that Mr. Lenn should take his evening  blood pressure medication as scheduled, and that at present it did not  sound like the patient needs to present to the emergency department.  However, if he begins to have chest pain, shortness of breath,  dizziness, or syncope, or if the family is simply uncomfortable with the  situation, I advised them to report to the emergency department. I  explained it may have to do with how the pacemaker was programmed  (hysteresis?), or perhaps the home monitor is not accurate. I suggested  that they call Dr. Ludwig Clarks office on Monday morning if the low heart  rates persist and he may need to have his pacemaker interrogated and an  EKG.     Lowella Bandy, MD  Electronically Signed    JJC/MedQ  DD: 09/19/2006  DT: 09/20/2006  Job #: [id number]

## 2011-01-24 NOTE — Assessment & Plan Note (Signed)
[facility]                               [name], [name]                       MRN:          [id number]  DATE:07/13/2006                            DOB:          [birth year]    HISTORY OF PRESENT ILLNESS:  This is an 77 year old white male patient of  Dr. Georgann Housekeeper who has a history of pauci-immune vasculitis with pulmonary  infiltrates which are steroid responsive. The patient returns today for a  one week followup. Over the last 3 weeks, the patient has had a  progressively worsening productive cough which he was seen at a local Urgent  Care. He was treated with a course of Doxycycline and returned back to Dr.  Jayme Cloud last week without significant improvement in symptoms. The patient  was then given 10  days of Levaquin and started on a prednisone taper. The  patient reports that he is significantly improved with decreased cough,  congestion and shortness of breath. The patient has completed 7 of the 10  days of Levaquin and is now on prednisone 10 mg. The patient denies any  hemoptysis, orthopnea, PND or leg swelling. Levaquin was for cellulitis.   PAST MEDICAL HISTORY:  Reviewed.   CURRENT MEDICATIONS:  Reviewed.   PHYSICAL EXAMINATION:  GENERAL:  The patient is a pleasant in no acute  distress.  VITAL SIGNS:  He is afebrile with stable vital signs. O2 saturation is 97%  on room air.  HEENT:  Unremarkable. Cellulitic cahnges resolved.  NECK:  Supple without adenopathy. No JVD.  LUNGS:  Sounds reveal some coarse breath sounds bilaterally without any  wheezes or crackles.  CARDIAC:  Regular rate.  ABDOMEN:  Soft, benign.  EXTREMITIES:  Warm without any edema.   IMPRESSION/PLAN:  Suspected acute exacerbation of his chronic lung disease.  Now much improved with antibiotic therapy and steroids. The patient will  complete his current antibiotic and steroid course. Follow back up with Dr.  Jayme Cloud as scheduled in  one month or sooner if needed. Resolved cellulitis  AD- F/U with Dr. Pollyann Kennedy - ENT.     Rubye Oaks, NP  Electronically Signed      Gailen Shelter, MD  Electronically Signed   TP/MedQ  DD: 07/14/2006  DT: 07/15/2006  Job #: [id number]

## 2011-01-24 NOTE — Discharge Summary (Signed)
NAME:  [name], [name]                ACCOUNT NO.:  [id number]   MEDICAL RECORD NO.:  [id number]          PATIENT TYPE:  INP   LOCATION:  3735                         FACILITY:  [facility]   PHYSICIAN:  Noralyn Pick. Eden Emms, MD, FACCDATE OF BIRTH:  [birth year]   DATE OF ADMISSION:  08/10/2006  DATE OF DISCHARGE:  08/13/2006                               DISCHARGE SUMMARY   REASON FOR ADMISSION:  This patient had allergy to penicillin and had  developed a cough with ACE I inhibitors.   PRINCIPAL DIAGNOSIS:  1. Discharging day two status post implant of Medtronic EnRhythm dual-      chamber pacemaker.  2. Admitted with bradycardia which looked on electrocardiogram to be a      3/2 Wenckebach.  The patient had slow ventricular response fatigue.  3. Subsequent complete heart block.  The patient is 100% A sensing and      V pacing currently.   SECONDARY DIAGNOSES:  1. History of myocardial infarction (non-Q-wave myocardial      infarction.)  A:  Ischemic cardiomyopathy with ejection fraction 48% per Myoview study  October 2006.  B.  Status post coronary artery bypass graft surgery x4 in 2001.  1. Hypertension.  2. Dyslipidemia.  3. Recurrent pneumonia with three episodes in 2007.      a.     Possible secondary to silent aspiration.      b.     Possible obstructive sleep apnea.  4. Benign prostatic hypertrophy status post transurethral resection of      the prostate.  5. History of prostate cancer with x-ray therapy.  6. Bronchiectasis.  7. History of renal cell carcinoma status post partial left      nephrectomy.  8. 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   PROCEDURE:  August 11, 2006 implant of Medtronic EnRhythm dual-chamber  pacemaker Dr. Sherryl Manges.  The patient had no postprocedural  complications.  The next day check showed that the patient was pacer  dependent.  In the interim from his admission through the emergency room  at [facility] on August 10, 2006 the  patient had developed  complete heart block.   BRIEF HISTORY:  [name] is an 77 year old male with a history of mild  myocardial infarction in 2001.  He had subsequent coronary artery bypass  graft surgery x4.  He had no recent symptoms of chest pain, dyspnea on  exertion or presyncope.  At times he awakes feeling his heart was racing  and he is short of breath.   Yesterday he did not feel himself.  He persistently took his blood  pressure and heart rate.  Blood pressure was low and his heart rate in  the 40s and 50s.  He went to Urgent Care.  Electrocardiogram showed some  heart block.  Dr. Graciela Husbands reviewed the electrocardiogram in the emergency  room with blocked beat pattern consistent with Wenckebach 3/2  ventricular response.   HOSPITAL COURSE:  The patient admitted through the emergency room to  [facility] on August 10, 2006.  His symptoms were fairly mild.  However, electrocardiogram showed that his heart was in a Wenckebach  pattern with AV node dysfunction.  After careful survey of the patient's  past history and past electrocardiograms, Dr. Graciela Husbands submitted the  patient for pacemaker implantation.  This was on August 11, 2006  without complication.  The patient subsequently developed complete heart  block.  The patient was kept an additional 24 hours to be discharged on  August 13, 2006.  The patient has been consistently A sensing V pacing  after implantation of pacemaker.  As part of his postprocedural workup  he had Lyme titer drawn as well as angio converting enzyme levels were  drawn.  These are pending at the time of his discharge.  The patient was  a ruled out for acute myocardial infarction by troponin I studies on  admission.  They were less than 0.05 then 0.02.  The patient is asked  not to drive for the next week and not to lift anything heavier than 10  pounds for next 4 weeks.  He is to keep his incision dry for next 7  days, to sponge bathe until  Tuesday August 18, 2006.   DISCHARGE MEDICATIONS:  1. Lipitor 80 mg daily at bedtime.  2. Cozaar 40 mg daily.  3. Prednisone 5 mg every other day.  4. Enteric-coated aspirin 81 mg daily.  5. Folic acid daily.  6. Multivitamin daily.  7. Vitamin E daily.  8. CO-Q-10 daily.  9. Calcium 1000 mg daily.   He has follow-up with [facility] [address]  1. Pacer clinic Thursday September 03, 2006 at 9 o'clock.  2. To see Dr. Olga Millers September 17, 2006 at 11:30.  3. To see Dr. Graciela Husbands December 15, 2006 at 9 o'clock.   LABORATORY STUDIES:  This admission the PT was 15.1, INR 1.2.  Complete  blood count:  White cells 9.1, platelets 464, hemoglobin 12.4,  hematocrit 36.2.  Serum electrolytes on admission:  Sodium 139,  potassium 3.6, chloride 111, carbonate 21, BUN is 22, creatinine 1.2,  glucose 94.  The TSH this admission was 2.451.  I repeat that the ACE  enzyme was pending as well as the Lyme titer pending.      Maple Mirza, PA      Theron Arista C. Eden Emms, MD, [facility]  Electronically Signed    GM/MEDQ  D:  08/13/2006  T:  08/13/2006  Job:  [id number]   cc:   Ellin Saba., MD  C. Danice Goltz, MD  Madolyn Frieze. Jens Som, MD, [facility]  Duke Salvia, MD, [facility]

## 2011-01-24 NOTE — Assessment & Plan Note (Signed)
[facility]                             [name], [name]                       MRN:          [id number]  DATE:12/11/2006                            DOB:          Dec 05, 1922    [name] seen today as a work-in, complaining of continued coughing up  of blood, just seen Dr. Jayme Cloud on December 04, 2006 for similar  complaints.  He finished a course of Levaquin for 5 days 750 mg.  Prednisone dosing is now down to 20 mg every other day after having  taken 40 mg a day for 4 days, and then 20 mg a day for 4 days.  He has  no other mucus besides just the blood.  He is having some shortness of  breath.  He does have a history of pauci-immune vasculitis and fleeting  infiltrates.   EXAMINATION:  Temperature 97, blood pressure 118/64, pulse 89,  saturation was 95% room air.  CHEST:  Showed distant breath sounds with rales at the left base.  CARDIAC:  Regular rate and rhythm without S3, normal S1, S2.  ABDOMEN:  Soft, nontender.  EXTREMITIES:  Showed no edema or clubbing.  SKIN:  Clear.  NEUROLOGIC:  Intact.  HEENT:  Showed no jugular venous distention, lymphadenopathy, oropharynx  clear, neck supple.   Chest x-ray was obtained, showed bilateral infiltrates, left greater  than right which appear to be somewhat worse compared to previous films.   IMPRESSION:  Pauci-immune vasculitis, now with increased flare and  associated hemoptysis.   PLAN:  Patient to receive increased prednisone at 40 mg a day for a 5  day course, then drop to 30 mg a day and hold.  The patient will return  to see Dr. Jayme Cloud in a week's time.  We need to consider potential for  repeat bronchoscopy in this patient.     Charlcie Cradle Delford Field, MD, [facility]  Electronically Signed    PEW/MedQ  DD: 12/11/2006  DT: 12/12/2006  Job #: [id number]   cc:   Gailen Shelter, MD

## 2011-01-24 NOTE — Discharge Summary (Signed)
NAME:  ADRIN, [name]                          ACCOUNT NO.:  [id number]   MEDICAL RECORD NO.:  [id number]                   PATIENT TYPE:  INP   LOCATION:  4730                                 FACILITY:  [facility]   PHYSICIAN:  Vida Roller, M.D.                DATE OF BIRTH:  December 28, 1922   DATE OF ADMISSION:  04/19/2004  DATE OF DISCHARGE:  04/20/2004                                 DISCHARGE SUMMARY   ADMISSION DIAGNOSES:  1. Chest pain rule out myocardial infarction.  2. Coronary artery disease, status post coronary artery bypass graft 2001.  3. History of renal cell cancer status post left partial nephrectomy.  4. Medicine noncompliance.   DISCHARGE DIAGNOSES:  1. Chest pain resolved, myocardial infarction ruled out with negative     enzymes.  Plan outpatient Cardiolite.  2. Coronary artery disease, status post coronary artery bypass graft 2001.  3. History of renal cell cancer status post left partial nephrectomy.  4. Medicine noncompliance.   HISTORY OF PRESENT ILLNESS:  [name] is a 77 year old gentleman who has a  history of CAD status post non-Q-wave MI in February 2001 which was  complicated by left main disease and mildly depressed LV dysfunction on  cath.  He underwent bypass surgery and has done well since then.  However,  this evening he noted onset of discomfort in the center of his chest without  significant radiation.  Began around midnight when he was trying to go to  sleep.  Associated with some diaphoresis and mild shortness of breath.  He  had felt poorly in the days prior to this and had gone to the urgent care.  He was evaluated and found to have bronchitis and treated with a Z-Pak.   He came to the emergency room this evening because of some discomfort in his  chest which did not resolve with one sublingual nitroglycerin.  However, at  time of evaluation he is pain-free.  No nausea, vomiting, fever, chills, or  night sweats.   EKG shows sinus rhythm with  no significant ST or T wave changes.  There are  some nonspecific changes.  Initial cardiac enzymes are negative.   The patient will be admitted for atypical chest pain to rule out MI.  If  cardiac enzymes are negative, we will send him home in the morning and plan  outpatient Cardiolite.  Resume aspirin and start ACE inhibitor therapy.   PROCEDURES:  None.   CONSULTATIONS:  None.   COMPLICATIONS:  None.   COURSE IN THE HOSPITAL:  [name] was admitted through the emergency room  at [facility] for atypical chest pain rule out MI.  He was resumed  on aspirin 325 mg one p.o. daily and Altace 2.5 mg a day.  Heparin and  nitroglycerin therapy were not initiated because of atypical symptoms and  pain-free status.   The patient remained pain-free overnight  and is anxious to go home on the  morning of April 20, 2004.  The first 2 sets of cardiac enzymes were  negative with a CK of 75, 72, MB 2.5, 2.4, troponin-I 0.02, 0.01.  The  patient refused the third set of labs of enzymes to be drawn.   The patient has been seen by Dr. Diona Browner and he feels he is stable for  discharge to home.  I have left a message for the office to call the patient  to schedule a stress Cardiolite as an outpatient and the patient will need a  follow up appointment in 3 to 4 weeks with Dr. Olga Millers.  Prescriptions have been given for Altace and sublingual nitroglycerin.   DISCHARGE MEDICATIONS:  1. Altace 2.5 mg a day.  2. Enteric-coated aspirin 325 mg a day.  3. Zetia 10 mg a day.  4. Lipitor 80 mg a day.  5. Nitroglycerin 0.4 mg one under the tongue every 5 minutes as needed for     chest pain.  Call 911 if no relief after three.   ACTIVITY:  As tolerated.   DIET:  Low salt, heart healthy.   APPOINTMENTS:  As mentioned.      Georgiann Cocker Jernejcic, P.A.                   Vida Roller, M.D.    TCJ/MEDQ  D:  04/20/2004  T:  04/21/2004  Job:  [id number]

## 2011-01-24 NOTE — Assessment & Plan Note (Signed)
[facility]                               [name], Kissoon                       MRN:          [id number]  DATE:06/23/2006                            DOB:          [birth year]    HISTORY OF PRESENT ILLNESS:  This is an 77 year old white male patient of  Dr. 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  Patient also had a community-  acquired pneumonia in August of this year, was treated with Avelox, which he  reports he did improve.  Patient presents today for followup.  Patient  reports, over the last 3 days, that he had increased cough, congestion and  fatigue.  He was seen subsequently by Urgent Care and informed he had what  he explained as pneumonia.  However, no chest x-ray was done.  Patient was  treated with Levaquin and has one additional day left.  Patient reports he  has essentially improved with decreased cough, congestion and shortness of  breath.  Patient denies any hemoptysis, chest pain, orthopnea, PND or  prevalent sputum.   PHYSICAL EXAMINATION:  GENERAL:  Patient is a pleasant, elderly male in no  acute distress.  VITAL SIGNS:  He is afebrile, stable vital signs.  O2 saturation is 97% on  room air.  HEENT:  Unremarkable.  NECK:  Supple without lymphadenopathy.  LUNGS:  Sounds are clear.  CARDIAC:  Regular rate and rhythm.  ABDOMEN:  Soft and benign.  EXTREMITIES:  Warm without any edema.   IMPRESSION/PLAN:  Recent what appears to be acute bronchitis versus  pneumonia.  Patient appears to be responding well to antibiotic therapy.  Patient will be checked here in two weeks with Dr. Marcos Eke with a follow-up  chest x-ray.  Patient is also recommended to add in Mucinex DM twice daily.      ______________________________  Rubye Oaks, NP    ______________________________  C. Danice Goltz, MD     TP/MedQ  DD:   06/23/2006  DT:  06/24/2006  Job #:  [id number]

## 2011-01-24 NOTE — Consult Note (Signed)
NAME:  [name], [name] NO.:  [id number]   MEDICAL RECORD NO.:  [id number]          PATIENT TYPE:  INP   LOCATION:  1823                         FACILITY:  [facility]   PHYSICIAN:  Duke Salvia, MD, FACCDATE OF BIRTH:  09/04/1923   DATE OF CONSULTATION:  08/11/2006  DATE OF DISCHARGE:                                 CONSULTATION   Thank you very much for asking [place] to see [name] in consultation  for second-degree symptomatic heart block.   HISTORY OF PRESENT ILLNESS:  [name] is an 77 year old gentleman with  ischemic heart disease.  Prior bypass surgery and a Myoview within the  year demonstrated ejection fraction of 48%.  He came to the hospital  yesterday from [facility] because he was not feeling well.  Took  his blood pressure at home and noted that his heart rate was gradually  decreasing through the day, going from 80/60 down to the 40s, often in  group beating.  He was seen at urgent care where his heart rate was  noted to be in 40-50 with a second degree AV block type I, as well as  2:1 block and also a newly diagnosed first degree AV block.  He was  referred to hospital.   Review of electrocardiograms, dating back a couple of years, on 2005 his  electrocardiogram was normal and I was able to retrieve an  electrocardiogram from November of 2007, which was also normal.   The patient has also been feeling lousy for the last 3 or 4 months.  He  has had a diagnosis of pneumonia made on 2 or 3 separate occasions and  is being followed by Dr. Jayme Cloud and has been treated with antibiotics  and steroids.  Yesterday, when the patient felt crummy, he was not sure  that he felt any crummier than he had on other days.  Further, the  crumminess that he felt over recent weeks, at least on 1 occasion when  he went to [facility], was associated with a heart rate of 75.  The patient has had no syncope.  He has had an irregular beat for a long  time.  He is not able to distinguish between the group beating from now  and the previous skips.   He denies recent problems with chest pain.   PAST MEDICAL HISTORY:  In addition to the above, is notable for:  1. Prostate cancer with radiation therapy and hormonal injections.  2. Renal cell carcinoma, status post partial left nephrectomy.  3. Hypertension.  4. Dyslipidemia.  5. Bronchiectasis.  6. Dialysis of immune vasculitis is noted on the chart.   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   REVIEW OF SYSTEMS:  His review of systems is noted on the intake sheet  and is, otherwise, noncontributory.   SOCIAL HISTORY:  He lives in [place] by himself.  He has, at least, 3  kids who are with him today.  He does not  use cigarettes, alcohol, or  recreational drugs.   MEDICATIONS:  At home included:  1. Lipitor 80.  2. Cozaar.  3. Prednisone.  4. Lupron therapy.  5. He is not on a beta blocker and he is not on aspirin, though the      latter was previously prescribed.   ALLERGIES:  HE IS ALLERGIC TO:  1. PENICILLIN.  2. FOSAMAX.  3. ACE INHIBITORS.   PHYSICAL EXAMINATION:  GENERAL:  He is an elderly, Caucasian male,  appearing his stated age of 77.  VITAL SIGNS:  His blood pressure was 126/70 and his pulse was 44 and  irregular.  HEENT:  Demonstrated no icterus or xanthoma.  NECK:  Veins are flat.  The carotids are brisk and full bilaterally  without bruits.  BACK:  Without kyphosis or scoliosis.  LUNGS:  Clear.  HEART:  Sounds were irregular without murmurs or gallops.  ABDOMEN:  Soft with active bowel sounds.  There was no midline pulsation  or hepatomegaly.  EXTREMITIES:  Femoral pulses were 2+, distal pulses were intact and  there is no clubbing, cyanosis or edema.  NEUROLOGICAL EXAM:  Grossly normal.  SKIN:  Warm and dry.   Electrocardiogram, dated earlier today, demonstrated  sinus rhythm at a  rate of about 75 with a PR interval that was at its minimum, about 500  milliseconds with a QRS duration of 113 milliseconds and an acute T  interval of about 340.  The axis leftward at -70.   Electrocardiogram from May of 2006, demonstrated sinus rhythm at  96/0.16/0.09/0.35.  The axis was also leftward and as noted  electrocardiogram from last month at urgent care, which I just now  found, is similar in the PR interval.  On that electrocardiogram was  174.   IMPRESSION:  1. New-onset first-degree AV block and second-degree AV block.  2. Bradycardia and fatigue, exercise intolerance, likely contributed      to by number 1.  3. Ischemic heart disease.      a.     Status post coronary artery bypass graft.      b.     Ejection fraction of 48%.      c.     Nonischemic Myoview in 2006.  4. Recurrent pneumonias and question bronchiectasis.  5. History of:      a.     Prostate cancer, status post radiation therapy and hormonal       therapy.      b.     Status post renal cancer with a partial nephrectomy.      c.     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  6. ?Immune vasculitis.  7. Hypertension, in the past difficult to control.   [name] has new onset heart block without obvious triggers.  There is  no drugs to implicate no electrolyte abnormalities to implicate and  inflammatory processes should hopefully be suppressed by the supervised  chronic steroid therapy.  Further, there is no evidence of myocardial  injury.  We are thus left with the possibility that this is a  progressive conduction system process, which is actually quite notable  also in the degree of his first degree AV block.  He has noted skipped  beats over the last couple of months and it is hard to know whether this  has been an intermittent problem in the past and is now persisting and whether this is contributing to his symptoms.  Because of the difficulty  in trying to sort this out, I  think 2 options present themselves.  One  is to proceed with pacing given the new onset, the severity and the  patient's recollection of having similar skis for some months and his  fatigue over the last couple of months.  The other would be to observe  him and see what the natural history of this turns to be.  Given his  concomitant comorbidities and pulmonary processes, I think it will be  very hard to sort out ultimately the contribution of one problem here.  I, thus, reviewed the case with Dr. Jens Som who is in favor of pursing  the former strategy, the family is also agreeable with the understanding  that this may not have a huge impact on his symptoms as there may be  other contributing factors.   We reviewed the potential benefits, as well as the potential risks,  including but not limited to death, perforation, infection, lead  dislodgment and device malfunction.  They are understand these risks,  agree, and are willing to proceed.      Duke Salvia, MD, [facility]  Electronically Signed     SCK/MEDQ  D:  08/11/2006  T:  08/12/2006  Job:  [id number]   cc:   Urgent Care  [facility]  Electropsyiology Laboratory

## 2011-01-24 NOTE — Discharge Summary (Signed)
[name], [name]                ACCOUNT NO.:  [id number]   MEDICAL RECORD NO.:  [id number]          PATIENT TYPE:  INP   LOCATION:  5740                         FACILITY:  [facility]   PHYSICIAN:  Stacie Glaze, MD    DATE OF BIRTH:  Feb 03, 1923   DATE OF ADMISSION:  12/13/2006  DATE OF DISCHARGE:  12/19/2006                               DISCHARGE SUMMARY   ADMITTING DIAGNOSES:  1. Hemoptysis.  2. 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  3. Obstructive sleep apnea.  4. Coronary artery disease.  5. Hypertension.  6. Hyperlipidemia.  7. History of renal cell carcinoma.   HOSPITAL COURSE:  The patient is an 77 year old white male who presented  with hemoptysis, progressive dyspnea, and chest x-ray changes, in the  setting of pauci-immune vasculitis that could represent worsening  arthritis, versus pneumonia.  Appropriate consultation was obtained with  Pulmonary for the hemoptysis and steroids were begun, IV Solu-Medrol,  and Dr. Craige Cotta was consulted.  Full consult was dictated and placed in the  chart.  He recommended that Avelox IV and Solu-Medrol be initiated and a  CT of the chest be obtained and clinical course be managed based upon  the CT findings.  Bronchoscopy was not pursued.  The patient responded  to the Avelox and the steroids and a steroid wean was begun with the  primary belief that this was primarily vasculitis.   He continued Avelox.  He continued the steroids and on December 18, 2006,  Pulmonary recommended to change to p.o. or oral steroids.  Was continued  an additional 5 days of fluoroquinolone and had recommended that he be  discharged on home oxygen with followup with Dr. Craige Cotta next week.  He had  an office visit scheduled with Dr. Craige Cotta on Tuesday following his  discharge today, which is Saturday, December 19, 2006.   The patient's home oxygen equipment was delivered and a [employer] delivered the oxygen.  Oxygen has been delivered to the  patient's room for transport home.   DISCHARGE INSTRUCTIONS:  The patient is discharged to home in stable but  guarded condition.   DISCHARGE DIAGNOSES:  1. Pauci-immune vasculitis with pneumonia and hemoptysis.  2. History of hypertension.  3. History of hyperlipidemia.  4. Coronary artery disease.  5. Renal insufficiency secondary to a history of renal cell carcinoma.   He is to follow up with Dr. Craige Cotta, as mentioned in the above dictation,  and his primary care doctor as needed.  Prescriptions for prednisone and  for Levaquin p.o. were given to the patient to continue his course of  antibiotics and steroid taper.  Oxygen has been delivered to the  patient's room and the patient understood these instructions.      Stacie Glaze, MD  Electronically Signed     JEJ/MEDQ  D:  12/19/2006  T:  12/19/2006  Job:  [id number]

## 2011-01-24 NOTE — Assessment & Plan Note (Signed)
[facility]                             [name], [name]                         MRN:          [id number]  DATE:10/14/2006                            DOB:          11/08/1922    I reviewed the results of Mr. Avetisyan' sleep study with him, which was  done on September 25, 2006, and this showed that he had an apnea/hypopnea  index of 11.1 with an oxygen saturation nadir of 84%.  He had a  significant positional effect with a supine apnea/hypopnea index of 69  versus a nonsupine apnea/hypopnea index of 6.  He also had a significant  increase in his periodic limb movement index to 112.  I have recommended  that he undergo positional therapy for his sleep apnea for now.  I will  then follow up with him in approximately one month, at which time  depending upon his symptom status will determine if he needs to have  additional forms of therapy for his sleep apnea.     Coralyn Helling, MD  Electronically Signed    VS/MedQ  DD: 10/14/2006  DT: 10/14/2006  Job #: [id number]   cc:   Gailen Shelter, MD  Madolyn Frieze. Jens Som, MD, [facility]

## 2011-01-24 NOTE — Procedures (Signed)
NAME:  [name], MINNEY NO.:  [id number]   MEDICAL RECORD NO.:  [id number]          PATIENT TYPE:  OUT   LOCATION:  SLEEP CENTER                 FACILITY[facility]  [facility]   PHYSICIAN:  Coralyn Helling, MD        DATE OF BIRTH:  1923-05-18   DATE OF STUDY:  09/24/2006                            NOCTURNAL POLYSOMNOGRAM   INDICATION FOR STUDY:  This is an individual who has symptoms of sleep  disruption and excessive daytime sleepiness.  He is referred to the  Sleep Lab for evaluation of hypersomnia with obstructive sleep apnea.   EPWORTH SLEEPINESS SCORE:  4.   MEDICATIONS:  Lipitor, Cozaar, furosemide, prednisone, multivitamin,  Vitamin E, Coenzyme Q10, folic acid, flaxseed oil, aspirin and digestive  enzymes.   SLEEP ARCHITECTURE:  Total recording time was 420 minutes, total sleep  time was 318 minutes, sleep efficiency was 76%, sleep latency was 15  minutes, REM latency was 197 minutes, which is slightly prolonged.  The  study  was notable for the lack of slow wave sleep.  The patient slept  predominantly in the non-supine position.  The patient was scheduled for  a split night study protocol but did not meet protocol criteria.   RESPIRATORY DATA:  The average respiratory rate was 16, the apnea-  hypopnea index was 11.1.  The events were exclusively obstructive in  nature.  Moderate snoring was noted by the technician.  The non-supine  apnea-hypopnea index was 6, the supine apnea-hypopnea index was 69.2,  the non-REM apnea-hypopnea index was 11.8, the REM apnea-hypopnea index  was 7.4.   OXYGEN DATA:  The baseline oxygenation was 95%, the oxygen saturation  nadir was 84%.  The patient spent a total of 406 minutes with an oxygen  saturation between 91-100% and 4 minutes with an oxygen saturation  between 81-90%.   CARDIAC DATA:  The average heart rate was 63 and the rhythm strip showed  normal sinus rhythm with sinus arrhythmia.   MOVEMENT-PARASOMNIA:  The periodic limb  movement index was 112.  The  patient had two bathroom trips.   IMPRESSIONS-RECOMMENDATIONS:  This study shows evidence for mild  obstructive sleep apnea as demonstrated by an apnea-hypopnea index of  11.1 and oxygen saturation nadir of 84%.  This would be consistent with  the diagnosis of hypersomnia with obstructive sleep apnea.  He did have  a significant positional component to his sleep apnea.  Positional  therapy could be attempted.  However, if he remains symptomatic then he  would likely need to have additional forms of therapy for his sleep  apnea.   The periodic limb movement index was elevated and clinical correlation  will be necessary to determine the significance of this.      Coralyn Helling, MD  Diplomat, American Board of Sleep Medicine  Electronically Signed     VS/MEDQ  D:  10/13/2006 15:39:29  T:  10/13/2006 17:34:36  Job:  [id number]

## 2011-01-24 NOTE — Assessment & Plan Note (Signed)
[facility]                               [name], [name]                       MRN:          [id number]  DATE:05/27/2006                            DOB:          Sep 29, 1922    SUBJECTIVE:  This is a very pleasant 77 year old gentleman who presents for  followup. He has pauci immune vasculitis and has evidence of pulmonary  infiltrate secondary to the same.  The patient presents today for followup.  He has his usual cough productive of scant whitish sputum, no fevers, chills  or sweats.  He denies any hemoptysis. He is currently maintained on  prednisone 5 mg daily.   CURRENT MEDICATIONS:  As noted on the intake sheet.  These have been  reviewed and are accurate.   PHYSICAL EXAMINATION:  VITAL SIGNS:  Oxygen saturation is 96% on room air.  GENERAL:  This is a well-developed elderly gentleman who was in no acute  distress.  HEENT:  Was remarkable for prophetic left eye.  NECK:  Is supple.  No JVD noted.  LUNGS:  Clear to auscultation bilaterally.  CARDIAC:  Regular rate and rhythm, no rubs, murmurs or gallops.  EXTREMITIES:  Patient has no cyanosis, no clubbing, no edema noted.   IMPRESSION:  1. Pauci immune vasculitis with evidence of pulmonary infiltrates.      Patient is well compensated.   PLAN:  1. Would be to obtain chest x-ray for followup.  2. Decrease prednisone to 5 mg every other day.  3. Follow up in 3 months time with a chest x-ray at that time.  4. Patient is to contact us prior to that time should any problems arise.     Gailen Shelter, MD  Electronically Signed    CLG/MedQ  DD: 07/09/2006  DT: 07/09/2006  Job #: [id number]

## 2011-01-24 NOTE — Assessment & Plan Note (Signed)
[facility]                                   ON-CALL NOTE   [name], [name]                         MRN:          [id number]  DATE:07/21/2006                            DOB:          1922/10/09    Daughter, [name], called from [phone] on July 21, 2006, at 6:45  p.m. complaining that her father has been dizzy and nauseous, states he has  a history of vertigo and has had this before.  They have antivert and  Phenergan at home but did not try this yet.  He has had no chest pain or  shortness of breath.  She wanted to know if it was okay to try those  medicines first before going to the ER.  I told her she could try one  Phenergan suppository and an antivert but if his symptoms do not improve or  if he starts throwing up, he would have to go to the emergency room.  The  daughter understood.     Lelon Perla, DO  Electronically Signed    Shawnie Dapper  DD: 07/21/2006  DT: 07/22/2006  Job #: [id number]   cc:   Ellin Saba., MD

## 2011-01-24 NOTE — Op Note (Signed)
[place]. [facility]  Patient:    [name]                          MRN: [id number] Proc. Date: 10/15/99 Adm. Date:  [id number] Attending:  Waldo Laine CC:         Daisey Must, M.D. [facility]                           Operative Report  PREOPERATIVE DIAGNOSIS:  Recent myocardial infarction and three-vessel coronary  occlusive disease.  POSTOPERATIVE DIAGNOSIS:  Recent myocardial infarction and three-vessel coronary occlusive disease.  SURGICAL PROCEDURE:  Coronary artery bypass grafting x 4, with the left internal mammary artery to the left anterior descending coronary artery, reverse saphenous vein graft to the circumflex coronary artery, reverse saphenous vein graft to the first diagonal coronary artery, reverse saphenous vein graft to the posterior descending coronary artery.  SURGEON:  Gwenith Daily. Tyrone Sage, M.D.  ASSISTANT:  Areta Haber, P.A.  INDICATIONS:  The patient is a 77 year old male who had been having episodes of  chest burning.  He was evaluated in mid-January by the [facility], including a stress test, which was positive for ischemia.  A cardiac catheterization had been recommended to the patient but he declined.  He returned several days prior to surgery, with an episode of prolonged chest pain, radiating to his left arm, with positive CPK-MBs and troponins, confirming a subendocardial myocardial infarction. The patient was stabilized medically and then underwent a cardiac catheterization which demonstrated a 50%-60% left main, diffuse 90% lesions of the LAD, involving a moderate-sized first diagonal, a 95% proximal circumflex lesion, and sequential 80% lesions of the right coronary artery.  Overall the ventricular function was depressed at approximately 45%-50%, with some anterior hypokinesis.  Because of the patients symptoms and severe three-vessel disease, coronary artery bypass grafting was  recommended.  The patient agreed and signed the informed consent.  DESCRIPTION OF PROCEDURE:  With Swan-Ganz and arterial line and monitors in place, the patient underwent general endotracheal anesthesia without incident.  The skin of the chest and legs was prepped with Betadine and draped in the usual sterile  manner.  The vein was harvested from the right lower extremity and was of good quality and caliber.  A median sternotomy was performed.  The left internal mammary artery was dissected down as a pedicle graft.  The distal artery was divided. t had good free flow.  The pericardium was opened.  Overall left ventricular function appeared preserved.  The patient was systemically heparinized.  The ascending aorta and the right atrium were cannulated and the aortic root vent cardioplegia needle was introduced into the ascending aorta.  The patient was placed on cardiopulmonary bypass at 2.4 L per minute, per sq m.  The sites of the anastomoses were selected and dissected out of the epicardium.  The patients body temperature was cooled o 28 degrees.  The aortic crossclamp was applied, and 500 cc of cold blood potassium cardioplegia was administered, with rapid diastolic arrest of the heart.  The myocardial septal temperature was monitored throughout the crossclamp time.  Attention was turned first to the circumflex coronary artery which was opened and admitted a 1.5 mm probe.  Using a running #7-0 Prolene, a distal anastomosis was performed, and a segment of reverse saphenous vein graft.  Additional cold blood cardioplegia was administered.  Attention was then turned to  the diagonal coronary artery which was opened.  It  also admitted a 1.5 mm probe.  Using a segment of reverse saphenous vein graft, a distal anastomosis was performed with a running #7-0 Prolene.  Attention was then turned to the distal right coronary artery.  This portion of the vessel was thickened, and  with a degree of calcium and plaque on the wall, it was decided that the proximal posterior descending coronary artery was a much more normal-appearing vessel, and it was decided to open the vessel in the proximal portion of the posterior descending and perform the bypass there.  The vessel was opened and admitted a 1.5 mm probe both proximally and distally without difficulty. Using a running #7-0 Prolene, a distal anastomosis was performed.  Attention was then turned to the left anterior descending coronary artery. Between the mid and distal third of the  vessel, the LAD was opened and it admitted a 1.5 mm probe proximally and distally, but just barely.  The distal LAD was relatively small.  Using a running #8-0 Prolene, the left internal mammary artery was anastomosed into the left anterior descending coronary artery.  With release of the bulldog on the mammary artery, there was appropriate rise in myocardial septal temperature.  The aortic crossclamp was removed.  The total crossclamp time was 55 minutes.  The patient required electrical defibrillation to return to a sinus rhythm.  A partial occlusion clamp was placed on the ascending aorta, and three  punch aortotomies were performed.  Each of the three vein grafts were anastomosed to the ascending aorta.  Air was evacuated from the grafts, and a partial occlusion clamp was removed.  The sites of the anastomoses were inspected and were free of bleeding.  The patient was then ventilated and weaned from cardiopulmonary bypass without difficulty, and remained hemodynamically stable.  He was decannulated in the usual fashion.  Protamine sulfate was administered.  The patient was somewhat oozing and took an extended amount of time to obtain hemostasis, but this was obtained satisfactorily.  A left pleural tube, two mediastinal tubes were left n place.  The pericardium was reapproximated.  The sternum was closed with  #6 stainless steel wires.  The fascia was closed with interrupted #0 Vicryl, and running #3-0 Vicryl in the subcutaneous tissue, and #4-0 subcuticular stitch in the skin edges.  Dry dressings were applied.  The sponge and needle counts were  reported as correct at the completion of the procedure.  The patient did not require any blood bank blood products during the operative procedure.  The total clamp time was 126 minutes. DD:  10/15/99 TD:  10/15/99 Job: [id number]

## 2011-01-24 NOTE — Assessment & Plan Note (Signed)
[facility]                                 ON-CALL NOTE   [name], [name]                         MRN:          [id number]  DATE:09/10/2006                            DOB:          [birth year]    ON CALL PHONE CALL   PCP:  Dr. Clent Ridges.   Mr. [name]' daughter, i.e. [name], calls in stating that he has  been having several days of nausea, vomiting, and dizziness.  He was  seen by ENT today and was told that the ear was within normal limits.  He continues to have the above symptoms.  She reports that she checked  his blood pressure this evening and it was at 188/105.  Of note, he  recently had a pacemaker implanted within the last month.   PLAN:  Given the above symptoms and elevated blood pressure, I  recommended he be seen in the emergency department this evening.  Daughter expressed understanding and agrees with plan.     Leanne Chang, M.D.  Electronically Signed    LA/MedQ  DD: 09/10/2006  DT: 09/10/2006  Job #: [id number]

## 2011-01-24 NOTE — Discharge Summary (Signed)
[facility]  Patient:    [name], [name]                         MRN: [id number] Adm. Date:  [id number] Disc. Date: 12/24/00 Attending:  Laqueta Jean CC:         Titus Dubin. Alwyn Ren, M.D. [facility]   Discharge Summary  DISCHARGE DIAGNOSIS:  Renal cell carcinoma.  PROCEDURE:  On December 21, 2000, the patient underwent left partial nephrectomy.  HISTORY OF PRESENT ILLNESS:  Mr. Masterson is a 77 year old, married, white male admitted to [facility] on March 17, with hemoptysis, pneumonia, polyarthralgias, right elbow and extremity pain.  The patient underwent rheumatologic evaluation and abdominal CT scanning eventually showed a 1.7 cm left solid renal mass consistent with renal cell carcinoma.  He was thought possibly to have a perineoplastic syndrome.  He is status post pulmonary evaluation by Dr. Jayme Cloud and is now admitted via the operating room for partial nephrectomy.  PAST MEDICAL HISTORY: 1. TURP (remote). 2. Left eye malignancy. 3. Septoplasty. 4. Tympanoplasty, removed (right). 5. Right ear deafness. 6. Left tympanic membrane implantation. 7. Coronary artery bypass graft in 2001. 8. Gastroesophageal reflux disease.  ALLERGY:  PENICILLIN.  REVIEW OF SYSTEMS:  Significant for history of hemoptysis.  SOCIAL HISTORY:  The patient is an Technical sales engineer.  No tobacco, no alcohol.  FAMILY HISTORY:  Father with MI and TB.  Mother with colon cancer.  MEDICATIONS: 1. Altace 5 mg p.o. per day. 2. Protonix 40 mg per day. 3. Celebrex 200 mg per day. 4. Ambien 5 mg q.h.s. 5. Tylenol p.r.n.  Admission physical examination is as noted in the H&P of December 21, 2000 (dictation [phone]).  LABORATORY DATA AND X-RAY FINDINGS:  Admission hemoglobin of 10.2 and admission hematocrit 34.5.  Discharge hematocrit 29.7.  Admission sodium 143, potassium 4.1, chloride 108, CO2 27, BUN 16, creatinine 1.1.  HOSPITAL COURSE:  On the date of admission, the patient  underwent left partial nephrectomy.  Pathology shows a renal cell carcinoma, clear cell type, nuclear grade 2-3/4 with a rare focus of parenchymal marginal involvement.  The other margins are free.  Tissue had been sent for frozen section analysis of the base margin. Thirty-five minutes of time elapsed between the time of taking the tissue and the report.  During that time, the patient was closed.  The margins did show a positive focus in the cauterized segment, but because the dissection went all the way back and into the collecting system, no further extra treatment should be done in this rather frail, elderly gentleman.  The patient has recovered nicely and feels perfectly well.  He is constipated postoperatively despite Peri-Colace.  We gave him Dulcolax suppository today.  He will be observed for 24 hours off monitor and will be discharged on April 18, if he does well.  DISCHARGE MEDICATION:  Tylox one q.4-6h. p.r.n. pain.  FOLLOWUP:  He will be followed up in the office for staple removal in one week and will have abdominal CT scan in four to six months.  He will eventually have evaluation by Dr. Edwyna Shell from [facility] for his chest abnormalities. DD:  12/23/00 TD:  12/23/00 Job: [id number]

## 2011-01-24 NOTE — Assessment & Plan Note (Signed)
Omnicef 300mg  Twice daily  For 7 days  Mucinex DM Twice daily  As needed  Cough/congestion  Fluids and rest  Please contact office for sooner follow up if symptoms do not improve or worsen or seek emergency care  follow up Dr. Craige Cotta  As planned and As needed

## 2011-01-24 NOTE — H&P (Signed)
NAME:  [name], [name]                ACCOUNT NO.:  [id number]   MEDICAL RECORD NO.:  [id number]          PATIENT TYPE:  INP   LOCATION:  3735                         FACILITY:  [facility]   PHYSICIAN:  Noralyn Pick. Eden Emms, MD, FACCDATE OF BIRTH:  May 06, 1923   DATE OF ADMISSION:  08/10/2006  DATE OF DISCHARGE:                              HISTORY & PHYSICAL   PRIMARY CARDIOLOGIST:  Dr. Olga Millers.   PRIMARY CARE PHYSICIAN:  Dr. Dianna Limbo.   PRIMARY PULMONOLOGIST:  Dr. Danice Goltz.   PATIENT PROFILE:  This is an 77 year old Caucasian male with prior  history of CAD and CABG who presents to the ED from Urgent Care with  complete heart block.   PROBLEM LIST:  1. Complete heart block.  2. Coronary artery disease.      a.     Status post non-ST-elevation MI in February 2001.      b.     Status post coronary artery bypass graft times four in       February 2001 with a left internal mammary artery to the left       anterior descending, vein graft to the circumflex, vein graft to       the obtuse marginal 1 and vein graft to the patent ductus       arteriosus.      c.     On June 21, 2005, adenosine Myoview with ejection       fraction of 48%.  He had inferior and intraseptal mild scar versus       attenuation.  No evidence of ischemia.  3. Difficult to control hypertension.  4. Hyperlipidemia.  5. Renal cell carcinoma, status post partial left nephrectomy.  6. Prostate cancer, status post external radiation therapy.  Currently      with monthly hormone injections.  7. History of bronchiectasis.  8. History of immune vasculitis with pulmonary infiltrates currently      on Prednisone therapy.  9. History of medical noncompliance.  10.Left eye tumor as a child.  Status post enucleation and external      radiation therapy.  11.Recent pneumonia and pleurisy approximately three weeks ago, status      post antibiotic therapy.   HISTORY AND PHYSICAL:  77 year old Caucasian male  with history of CAD,  status post CABG times four in February 2001 with a non-ischemic Myoview  in October 2006.  Over several years, he has noted periods of erratic  blood pressure and/or heart rates with reported normal Holter monitor in  the past.  This morning he checked his blood pressure and it was lower  than lower being 83/62.  So he continued to check it hourly with blood  pressures ranging from a low of 76/48 to a high of 183/70.  He denied  any chest pain, shortness of breath, dizziness or syncope but noted that  something just did not feel right.  His previous anginal equivalent was  chest pain and pressure.  Later in the afternoon he also began to notice  erratic heart rates ranging from the 40s to the 80s.  This prompted him  to present to Urgent Care.  In Urgent Care, reportedly he was shown to  have a heart rate of 33 and then a 12-lead ECG was performed revealing a  complete heart block with rates in the 60s to 70s.  At that point we  were notified he was being sent to the ED.  He is currently asymptomatic  and remains in complete heart block with rates between 70 and 80.   ALLERGIES:  1. Penicillin.  2. Fosamax.  3. ACE inhibitor, which causes a cough.   HOME MEDICATIONS:  1. Lipitor 80 mg q h.s.  2. Cozaar 15 mg every day.  3. Co-Enzyme Q10 every day.  4. Vitamin E and C every day.  5. Multivitamin every day.  6. Calcium every day.  7. Prednisone 5 mg every other day.  8. He also receives a hormone injection monthly for prostate CA,      depending on what his PSA is.   SOCIAL HISTORY:  He lives in [place] by himself.  He is a retired  Technical sales engineer.  His wife died several years ago.  He denies any tobacco,  alcohol or drug use.  He does not routinely exercise.   FAMILY HISTORY:  Mother died of an MI at age 47.  Father died of  complications of AAA and an MI at age 30.  Brother died of COPD.   REVIEW OF SYSTEMS:  He had diarrhea about a week ago.  He noted  that  something just did not feel right this morning but cannot pin point the  symptoms. Notably he denies any chest pain, shortness of breath, PND,  orthopnea, dizziness, syncope, edema or early satiety.   PHYSICAL EXAMINATION:  VITAL SIGNS:  Temperature 97.0.  Heart rate 76.  Respirations 14.  Blood pressure 126/70.  Pulse oximetry 99% on room  air.  GENERAL:  A pleasant white male in no acute distress.  Awake, alert and  oriented times three.  NECK:  Normal carotid upstrokes.  No bruits or JVD.  LUNGS:  Respirations regular and nonlabored.  Clear to auscultation.  CARDIAC:  Irregularly irregular S1, S2.  He has no S3, S4 or murmurs.  ABDOMEN:  Round, soft, nontender, non-distended.  Bowel sounds present  times four.  EXTREMITIES:  Warm, dry, pink.  No clubbing, cyanosis or edema.  Dorsalis pedis, posterior tibialis pulses 2+ and equal bilaterally.  HEENT:  Atraumatic with a left eye prosthesis.  NEURO:  Grossly intact and nonfocal.   CLINICAL FINDINGS:  His chest x-ray is pending.  All of his lab work is  pending.   ASSESSMENT/PLAN:  1. Complete heart block.  The patient's rate is currently in the 70s      and he is asymptomatic.  He remains in heart block.  We will plan      to admit him to the ICU.  We will cycle cardiac markers and have      his __________ in place. We will have __________ at the bedside as      well as Atropine.  He will probably need to see electrophysiology      for probable pacemaker tomorrow.  We will keep him NPO after      midnight in case he can get on the schedule.  2. Coronary artery disease.  The patient's anginal equivalent is chest      pain and pressure, which is not present at this time.  We will  cycle cardiac markers.  We will continue his ARB as well as statin      and add back low dose aspirin which he has been on in the past.  He      is not on a beta blocker and we have no plans to initiate one. 3. Hypertension.  This is stable.   Continue ARB with home parameters.      His pressures were lower earlier today.  4. Hyperlipidemia.  We will check lipids and LFTs.  Continue statin      therapy.  5. History of immune vasculitis.  This is followed by Dr. Jayme Cloud as      an outpatient.  He remains on prednisone therapy and is now on 5 mg      every other day for approximately another six to seven weeks, per      patient.      Nicolasa Ducking, ANP      Noralyn Pick. Eden Emms, MD, [facility]  Electronically Signed    CB/MEDQ  D:  08/10/2006  T:  08/11/2006  Job:  [id number]

## 2011-01-24 NOTE — Assessment & Plan Note (Signed)
[name]                             [name], [name]                       MRN:          [id number]  DATE:12/04/2006                            DOB:          Jun 10, 1923    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 The patient presents  today because of cough productive of streaky hemoptysis and purulent  sputum. This started approximately a week ago. He has been treated with  a Z-pack for which he has been on the 2nd day, but has no improvement.  He denies any dyspnea and he did have a chest x-ray performed at  [name]. We have pulled this x-ray and it shows no  new changes from his baseline. He does have an element of fibrosis  secondary to his pauci-immune vasculitis.   CURRENT MEDICATIONS:  Are as noted on the intake sheet. These have been  reviewed and are accurate.   PHYSICAL EXAMINATION:  VITAL SIGNS:  As noted. Oxygen saturation is 96%  on room air.  GENERAL:  This is a well developed, well nourished gentleman who is in  no acute distress.  HEENT:  Unremarkable from prior. He does have prosthetic eye.  NECK:  Supple, no adenopathy noted. No JVD.  LUNGS:  He has rhonchi throughout. No wheezes noted.  CARDIAC:  Regular rate and rhythm. No rubs, murmurs, or gallops heard.  ABDOMEN:  Benign.  EXTREMITIES:  No cyanosis, clubbing, or edema noted.   IMPRESSION:  Acute bronchitis and a patient with known pauci-immune  vasculitis and steroid responsive evanescent pulmonary infiltrate.   PLAN:  1. We will give the patient a prednisone taper. The patient will also      monitor his symptoms after he completes the Z-pack. If this is not      helpful, we will consider treating him with Levaquin 750 mg daily      x5 days.  2. The patient's follow up will be in 2 weeks time with our nurse      practitioner, [name]  [name]. The patient will follow up      subsequently with Dr. Coralyn [name], who has already been evaluating      the patient for sleep apnea. The patient is also to have a chest x-      ray at that time.  3. Follow up with me will be on a p.r.n. basis. The patient was      instructed that I will be leaving the practice at the end of June      and he would like to continue follow up with [name].     [name] Shelter, MD  Electronically Signed    CLG/MedQ  DD: 12/04/2006  DT: 12/05/2006  Job #: [id number]

## 2011-01-24 NOTE — Assessment & Plan Note (Signed)
[facility]                             [name], [name]                       MRN:          [id number]  DATE:12/22/2006                            DOB:          1922-09-20    I saw [name] with his daughter today in followup after he was  discharged from the hospital.  He says that his coughing has decreased.  He is still having some sputum production mixed with blood but this has  improved.  He still has some degree of dyspnea, but says he is able to  do most of his activities at home without difficulty.  He is to complete  a 7-day course of Levaquin 750 mg daily, and he is on a tapering dose of  prednisone.  His other medications were reviewed.   PHYSICAL EXAMINATION:  He weighs 167 pounds.  Blood pressure is 116/78.  Oxygen saturation is 94% of 2 L.  Heart rate is 89 and regular.  HEENT:  There is no sinus tenderness, no oral lesions.  HEART:  Was S1, S2.  CHEST:  There were faint rales heard at the bases, but no wheezing.  ABDOMEN:  Soft, nontender.  EXTREMITIES:  With no edema.   IMPRESSION:  1. Pauci-immune vasculitis, hemoptysis, and pneumonia.  I would have      him complete his course of Levaquin.  I would continue his tapering      of prednisone.  I will then followup with him in approximately 2      weeks at which time I will have him undergo a chest x-ray, and      assess his symptom status to determine if we can continue to      decrease the dose of his prednisone, or if he would need to have      augmentation of his immunosuppressant regimen.  2. Obstructive sleep apnea.  I will hold off on starting him on      continuous positive airway pressure therapy until he is further      improved from his pneumonia.  3. Hypoxemia related to his vasculitis and pneumonia.  I will reassess      his oxygen needs at his next followup.     [name] Helling, MD  Electronically Signed    VS/MedQ  DD: 12/23/2006  DT:  12/23/2006  Job #: [id number]

## 2011-01-24 NOTE — Assessment & Plan Note (Signed)
[facility]                             PULMONARY OFFICE NOTE   NAME:DAVISKline, Bulthuis                       MRN:          [id number]  DATE:08/27/2006                            DOB:          [birth year]    I saw 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   He apparently was treated for pneumonia with pleurisy again on November  15, with a course of antibiotics.  He, unfortunately, does not recall  which antibiotic this was.  He did not notice any difference in his  symptoms after this, and he remained on the same dose of prednisone at 5  mg every other day.  His symptoms consist of feeling tired with a dry  cough.  He also complains of feeling a lack of energy and no ambition,  stating that he has a great deal of difficulty motivating himself to do  anything such as going to work, whereas he said previously he used to  get a great deal of satisfaction from this.  He says that he has also  been having problems as far as difficulty sleeping at night.  He will  often times wake up with a feeling like he cannot catch his breath, and  it feels like his heart is racing, as well as waking up with a cough.  He has been told that he snores before.  Additionally, he will often  times fall asleep unintentionally during the day.   CURRENT MEDICATIONS:  1. Folic acid.  2. Multivitamin.  3. Calcium.  4. Vitamin E.  5. Coenzyme Q10.  6. Lipitor 40 mg daily.  7. Prednisone 5 mg qPerezoPerezd.  8. Cozaar 40 mg qPerezd.  9. Sublingual nitroglycerin pPerezrPerezn.   GENERAL EXAM:  He is 172 pounds, temperature is 97[name]4, blood pressure is  130/76.  Heart rate is 82.  Oxygen saturation 96% on room air.  HEENT:  There is no sinus tenderness, no nasal discharge, no oral  lesions, no lymphadenopathy.  HEART:  S1, S2, regular rate and rhythm.  CHEST:  Fine crackles heard at the bases but no wheezes.  ABDOMEN:  Soft, nontender,  positive bowel sounds.  EXTREMITIES:  There was 1+ ankle edema.   IMPRESSION:  1. Pauci-immune vasculitis with mild pulmonary fibrosis.  He appears      to be stable with regard to his symptoms at the present time, and I      do not believe that he has a recurrence of pneumonia at the present      time.  Therefore, I will not make any adjustments in his dose of      prednisone.  I do not feel that he needs antibiotics at this time,      and I do not feel that he would need to have repeat imaging studies      done at this time.  2. Sleep disturbance with symptoms of excessive daytime sleepiness.      [name] is concerned that he may, in fact, have some degree of  sleep apnea.  I feel that there is enough clinical concern to      warrant having him undergo an overnight polysomnogram and then,      upon review of this, would initiate him on appropriate therapy, if      in fact he does have sleep apnea.  3. I am also concerned that he may have some degree of depression as      well, given his complaints of feeling like a lack of ambition and      lack of motivation.  I discussed this with [name], and he will      have this further evaluated by his primary care physician.  4. I had offered [name] an influenza vaccination in our office      today.  He says he would prefer to wait until after the holiday      season.  I had informed him that that is relatively late in the      season to receive an influenza vaccination, but he said that he was      okay with that.   I will make arrangements for him to follow up with Dr. Jayme Cloud in  approximately 6-8 weeks.     [name] Helling, MD  Electronically Signed    VS/MedQ  DD: 08/27/2006  DT: 08/27/2006  Job #: [id number]   cc:   Gailen Shelter, MD  Madolyn Frieze. Jens Som, MD, [facility]

## 2011-01-24 NOTE — Op Note (Signed)
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   POSTOPERATIVE DIAGNOSIS:  Symptomatic 2nd degree heart block with 1st  degree heart block, as well.   PROCEDURE:  Dual-chamber pacemaker implantation with fluoroscopy.   Following obtaining informed consent, the patient was brought to the  Electrophysiology Laboratory and placed on the fluoroscopic table in a  supine position.  After routine prep and drape of the left upper chest,  lidocaine was infiltrated in the prepectoral, subclavicular region.  An  incision was made and carried down to the layer of the prepectoral  fascia using electrocautery and sharp dissection.  Hemostasis was  obtained.   Thereafter, attention was turned to gaining access to the extrathoracic  left subclavian vein, which was accomplished without difficulty without  the aspiration of air.  Two guidewires were placed and retained, and a 0-  silk suture was placed in a figure-of-eight fashion and allowed to hang  loosely.   Sequentially, 7-French tear-away introducer sheaths were placed, which  were passed sequentially.  A Medtronic 5076, 58-cm active fixation  ventricular lead and Medtronic 5076, active fixation atrial lead.  The  ventricular serial number was [id number], and the atrial lead serial  number was [id number].  They were manipulated to the right ventricular  septum and the right atrial appendage, respectively, where the bipolar R  wave was 4.3 immediately after the screw deployment.  Impedance was  1544.  The threshold was 0.6 volts at 0.5  milliseconds.  Current of  threshold was 0.3 mA, and the current of injury was brisk.   The bipolar P wave was 1.7 millivolts with a pacing impedance of 621  ohms with threshold of 1.3 volts at 0.5 milliseconds.  The current of  threshold was 2.6 mA.  It also had a brisk current of injury.  These  leads were secured to the prepectoral fascia, and then attached to a  Medtronic EnRhythm P1501 pulse generator, serial number [id number].  Through the device, ventricular pacing, and then P-synchronous pacing  were identified.  The pocket was copiously irrigated with antibiotic-  containing saline solution.  Hemostasis was ensured in the leads, and  the pulse generator replaced in the pocket, secured to the prepectoral  fascia.  The wound was then closed in 3 layers in the  normal fashion.  The wound was washed, dried, and a benzoin and Steri-  Strip dressing was applied.  Needle counts, sponge counts and instrument  counts were correct at the end of the procedure according to the staff.  The patient tolerated the procedure without apparent complication.      Duke Salvia, MD, [facility]  Electronically Signed     SCK/MEDQ  D:  08/11/2006  T:  08/12/2006  Job:  [id number]   cc:   Madolyn Frieze. Jens Som, MD, [facility]  Elecrophysiology Laboratory  [facility]

## 2011-01-24 NOTE — Discharge Summary (Signed)
[place]  Patient:    [name], [name]                         MRN: [id number] Adm. Date:  [id number] Disc. Date: [id number] Attending:  Dolores Patty CC:         Sigmund I. Patsi Sears, M.D.  Pollyann Savoy, M.D.  Jacquiline Doe, M.D., [facility]   Discharge Summary  DISCHARGE DIAGNOSES: 1. Bilateral patchy infiltrates/pneumonia/bronchitis. 2. Left renal mass, status post biopsy by Dr. Patsi Sears. 3. Generalized malaise and some arthralgia after taking Cipro which has    since resolved after being on Celebrex.  Question of whether he might have    rheumatological disorder such has collagen vascular disease versus    perineoplastic syndrome, especially in light of the left renal mass.  DISCHARGE MEDICATIONS:  He will continue with his home medications of Altace 5 mg p.o. q.d., Nexium 40 mg p.o. q.d., aspirin once a day, folic acid once a day, Pravachol once a day, and also he has the Muco-Fen-DM that he can take on a p.r.n. basis.  DISCHARGE FOLLOW-UP: 1. With Dr. Baldo Daub in two weeks.  At that time, he will need repeat chest    x-ray.  At the time of the visit with Dr. Baldo Daub, if the repeat    chest x-ray does not show any improvement in the patchy infiltrates,    especially in light of the possibility of the rheumatological disorder. 2. In terms of follow-up, he is to follow up with Dr. Patsi Sears and    Dr. Patsi Sears has given him his business card, and the patient is to    call him on Monday to set up an appointment. 3. Follow up with Dr. Pollyann Savoy, rheumatology, on 1-2 weeks also.  DISCHARGE DIET:  Low fat, low salt.  ACTIVITY:  As tolerated.  HOSPITAL COURSE:  This 77 year old, white male with history of coronary artery disease status post bypass grafting in February 2001, was admitted by Dr. Alwyn Ren on March 17, with progressive arthralgia, profound fatigue, with impairment of gait.  The patient  initially came to see Dr. Baldo Daub approximately two prior to his admission for feelings malaise, and at that time was having a cough, productive of sputum with blood tinge.  The patient was started on Ceftin and brought back for follow-up visit at which time, there was noted on chest x-ray, slight improvement in the infiltrative process.  However, the switch was made to Cipro at that time.  Since taking the Cipro, the patient started developing arthralgias.  The patient subsequently came to the emergency room because of extreme difficulty with gait and fatigue.  During his hospitalization here, the patient had numerous blood work.  He had CPKs that were done that were negative.  He also had a rheumatology consult and Dr. Corliss Skains had ordered numerous tests, including an ANA, SED rate, P&C ANCA, lime titer, DNAs, Parvovirus B 19 antibodies, EBV antibodies, etc.  All of those have come back showing the Parvovirus B 19 was negative.  His SED rate was elevated at 88.  His ANA was negative.  His P&C ANCA showing P-ANCA staining pattern with the C-ANCA being negative.  His C-ANCA IgG was 1:256.  He also had C4 compliment level that came back at 20, C3 compliment level was 137, both of these being in the normal range.  His lime titer, once again, was also negative.  His EBV titers came back  suggesting possible previous EBV infection.  As part of his workup for the patchy infiltrates and the myalgias and a question of whether he might have some lesions in his chest, a CT scan was obtained, and, at that time, there were no lesions noted in the chest.  However, patchy infiltrates were noted in the bilateral lower lobes and in the left upper.  As part of that CT scan, it was noted that he appeared to have left renal mass, and further evaluation with that done by renal ultrasound.  Because of some allergies to the contrast material, the patient had a ultrasound done which showed a mass  consistent with a solid mass.  The patient underwent biopsy of the renal mass on March 22, and the biopsy report is, at this point in time, available.  Of note is the fact that he did have also microscopic hematuria done on urine when he was admitted.  The patient has done well during the hospitalization.  The patient was placed back on antibiotics to see if there was resolution of his infiltrates.  He is to stay on Ceftin 500 mg p.o. b.i.d. for a total of 14 days.  He will then follow up with Dr. Baldo Daub in the clinic, at which time a repeat chest x-ray will be done to determine if there is clearing of the patchy infiltrates.  A couple of considerations for this was whether he might have bronchiolitis obliterating organizing pneumonia versus if he might have some collagen vascular disease such as _____ since he does have this hemoptysis periodically, and also does have some sinus-related problems.  In regards to his left renal mass, the patient is to follow up with Dr. Patsi Sears for the next step on the mass.  We will all have to coordinate when he goes for surgery and what all he needs done.  I did recommend to the patient that he set up a follow-up appointment with Dr. Pollyann Savoy to go over the test results and the interpretation of this test results and the significance of these.  The patient is being discharged in stable condition.  DD:  11/28/00 TD:  11/30/00 Job: [id number]

## 2011-01-24 NOTE — Progress Notes (Signed)
Noted  

## 2011-01-24 NOTE — Consult Note (Signed)
NAME:  [name], [name]                ACCOUNT NO.:  [id number]   MEDICAL RECORD NO.:  [id number]          PATIENT TYPE:  INP   LOCATION:  5740                         FACILITY:  [facility]   PHYSICIAN:  Coralyn Helling, MD        DATE OF BIRTH:  March 24, 1923   DATE OF CONSULTATION:  12/14/2006  DATE OF DISCHARGE:                                 CONSULTATION   REFERRED BY:  Valetta Mole. Swords, M.D.   REASON FOR CONSULTATION:  Hemoptysis.   HISTORY OF PRESENT ILLNESS:  [name] is an 77 year old male who was  admitted on April 6 with hemoptysis.  He had been seen in the pulmonary  office twice over the last 2 weeks with symptoms of cough with yellowish  sputum production in addition to having hemoptysis.  He apparently was  having symptoms of sinus congestion initially, was treated with a course  of azithromycin.  He subsequently had been given a prednisone taper as  well as an additional course of Levaquin.  He returned to the office  with similar complaints and had an additional course of prednisone  given.  His symptoms progressed and he was also having increasing  dyspnea with exertion and as a result he presented himself to the  hospital for admission.  He denies having any fever, chills, or sweats.  He is not having any chest pain.  He denies any headaches or post nasal  drip currently.  He says that his breathing has improved since being  admitted to the hospital.  He is still coughing up blood mixed with  sputum as well as just red blood, but that this has decreased in amount  since being admitted.  He denies any skin rashes or leg swelling.  He  also denies any diarrhea or dysuria.  He says that he has been having  intermittent episodes of shoulder, elbow, and knee pain as well as  swelling.  He says he also has had episodes of which he has gotten  swelling in his ears with redness as well as pain.   PAST MEDICAL HISTORY:  Significant for:  1. 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  2. Obstructive sleep apnea.  3. Coronary artery disease status post myocardial infarction with      ejection fraction 48%.  4. Hypertension.  5. Hyperlipidemia.  6. Renal cell carcinoma status post partial nephrectomy.  7. 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  8. Complete heart block status post pacemaker placement.   ALLERGIES:  HE HAS AN ALLERGY TO PENICILLIN AND ACE INHIBITORS CAUSE HIM  TO DEVELOP A COUGH.   OUTPATIENT MEDICATIONS:  1. Lipitor 80 mg daily.  2. Cozaar 50 mg daily.  3. Aspirin 81 mg daily.  4. CoQ10 daily.  5. Multivitamin, vitamin E, and vitamin C.  6. He is currently on Avelox 100 mg daily.  7. Solu-Medrol 80 mg q.8 hours.   SOCIAL HISTORY:  He denies any recent smoking or alcohol use.   FAMILY HISTORY:  Noncontributory.   REVIEW OF SYSTEMS:  Essentially negative, except for  stated above.   PHYSICAL EXAMINATION:  GENERAL:  He was seen in his hospital room.  He  was awake, alert, and oriented and does not appear to be in any acute  distress.  VITAL SIGNS:  Blood pressure is 108/59.  Heart rate is 108.  Respiratory  rate 18.  Temperature 98.7.  Oxygen saturation 92% on 2 liters.  HEENT:  He has an orthotic in his left eye.  Right eye is reactive.  There is no sinusitis.  No nasal discharge.  No oral lesions.  NECK:  No lymphadenopathy.  No thyromegaly.  HEART:  S1, S2.  Regular rate and rhythm.  CHEST:  He had bilateral crackles more prominent at the bases.  ABDOMEN:  Thin, soft,  nontender.  Positive bowel sounds.  EXTREMITIES:  No edema, cyanosis, or clubbing.  NEUROLOGICAL EXAM:  No focal deficits were appreciated.   DIAGNOSTICS:  Chest x-ray from April 6, showed bilateral lower lobe  infiltrates, more prominent on the right than the left with increase in  interstitial markings bilaterally.   LABORATORY DATA:  Hemoglobin was 11.3, hematocrit 13.1, WBC 8.10,  platelet count 377.  Sodium is 140, potassium is 4, chloride is  110, CO2  is 24, BUN is 24, creatinine is 1.3, glucose is 177.  PT is 14.2, INR is  1.1, calcium 8.8, BNP is 278.   IMPRESSION:  1. Hemoptysis.  2. Progressive dyspnea.  3. 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  He      seems to have improved symptomatically with his increase in dose of      his systemic steroids as well as additional course of antibiotics.      I will have him undergo a CT scan of the chest with contrast to      further evaluate his lung parenchyma.  Then depending upon his      clinical course as well as his findings on CT scan of the chest.  I      will determine if he will need to have a more invasive procedures      with bronchoscopy.  4. Obstructive sleep apnea.  He seems to be reasonably asymptomatic at      the present time.  I would refrain from using CPAP therapy until      his coughing and hemoptysis have improved.  5. Coronary artery disease status post myocardial infarction with      systolic dysfunction with an ejection fraction of 48%.  6. Hypertension and hyperlipidemia.  This will be further managed by      his primary care.  7. History of renal cell carcinoma status post partial nephrectomy.  8. Left eye enucleation.  9. Complete heart block status post pacemaker placement.     Coralyn Helling, MD  Electronically Signed    VS/MEDQ  D:  12/14/2006  T:  12/15/2006  Job:  [id number]

## 2011-01-24 NOTE — Discharge Summary (Signed)
NAME:  [name], [name]                          ACCOUNT NO.:  [id number]   MEDICAL RECORD NO.:  [id number]                   PATIENT TYPE:  INP   LOCATION:  3736                                 FACILITY:  [facility]   PHYSICIAN:  Carole Binning, M.D. [facility]         DATE OF BIRTH:  [birth year]   DATE OF ADMISSION:  05/18/2003  DATE OF DISCHARGE:  05/19/2003                           DISCHARGE SUMMARY - REFERRING   SUMMARY OF HISTORY:  Mr. Knippel is a 77 year old white male who presented on  May 18, 2003 after he was awakened from sleep at 1 a.m. with chest  tightness associated with shortness of breath and tingling in the left arm  and hand. He took nitroglycerin, and his symptoms resolved. He was admitted  for further evaluation. His history is notable for non-Q-wave myocardial  infarction in February 2001. At that time, he had three-vessel coronary  artery disease and EF of 60%. He underwent bypass surgery without  difficulty. Subsequently, he has had diagnosis of renal cell carcinoma and  has had a kidney removed by Dr. Patsi Sears. His history is also notable for  a left eye tumor status post radiation therapy, nasal surgery, TURP, and  left partial nephrectomy.   LABORATORY DATA:  Admission H and H was 13.6 and 40.1, normal indices,  platelets 228, WBCs 5.6. Sodium 142, potassium 3.8, BUN 19, creatinine 1.2,  glucose 105. Normal LFTs. CK-MBs both in the ER and regular were within  normal limits. EKG showed sinus bradycardia, left axis deviation, left  anterior hemiblock, nonspecific ST-T wave changes.   HOSPITAL COURSE:  Mr. Sinkfield is admitted to 3700. Overnight, he did not have  any further chest discomfort. On telemetry, he did have a six beat run of  wide complex tachycardia for which he was asymptomatic. After review by Dr.  Gerri Spore on September 9, he agreed with Dr. Lubertha Basque initial plan. For this  history, it was felt that he should have a Cardiolite here in the hospital;  however, the patient refused to stay to have this on September 11 secondary  to his schedule both in the office and the hospital unable to do on  September 10. Thus, he was discharged home.   DISCHARGE DIAGNOSES:  1. Chest discomfort of undetermined etiology, no myocardial infarction.  2. Nonsustained ventricular tachycardia with a normal potassium.  3. History as previously.   DISPOSITION:  He is discharged home. He is asked to continue aspirin 325 mg  q.d., Lipitor 40 mg q.h.s., Prilosec q.d., folic acid q.d., nitroglycerin  0.4 as needed, and received a new prescription for Toprol-XL 25 mg q.d. He  was asked to maintain a low salt, fat, and cholesterol diet. He will have a  stress Cardiolite on Tuesday, September 14 at 1 p.m. He was advised nothing  to eat or drink after midnight on Monday, but he may take his medications on  the morning of Tuesday. He will follow  up with Dr. Joseph Berkshire. on  Monday, September 20 at 12:30 to review the stress Cardiolite. Arrangements  will also be made for an outpatient Holter monitor to further evaluate the  frequency  of wide complex tachycardia. This will also be reviewed at the time of  followup. At the time of followup with Dr. Joseph Berkshire., review should be  given as to when the patient's last lipids were checked and reinforced  cardiac risk factor modification.      Joellyn Rued, P.A. [facility]                    Carole Binning, M.D. [facility]    EW/MEDQ  D:  05/19/2003  T:  05/20/2003  Job:  [id number]   cc:   Titus Dubin. Alwyn Ren, M.D. [facility]   [place], [place].D.

## 2011-01-24 NOTE — Discharge Summary (Signed)
NAME:  [name], [name] NO.:  [id number]   MEDICAL RECORD NO.:  [id number]          PATIENT TYPE:  INP   LOCATION:  3735                         FACILITY:  [facility]   PHYSICIAN:  Maple Mirza, PA   DATE OF BIRTH:  1923-03-20   DATE OF ADMISSION:  08/10/2006  DATE OF DISCHARGE:  08/13/2006                               DISCHARGE SUMMARY   ADDENDUM:  This concerns an x-ray report. X-ray taken after pacemaker implantation  on December 5. The study shows that there are probable areas of scarring  bilaterally. Pacemaker is present with both leads in appropriate  position.  No pneumothorax. There is also noted a focal right perihilar  area of atelectasis or infiltrate in the area of the right middle lobe.  This may be due to fibrosis or mild edema according to the radiology  readers.      Maple Mirza, PA     GM/MEDQ  D:  08/13/2006  T:  08/13/2006  Job:  [id number]   cc:   Ellin Saba., MD  C. Danice Goltz, MD  Madolyn Frieze. Jens Som, MD, [facility]

## 2011-01-24 NOTE — Patient Instructions (Addendum)
Omnicef 300mg Twice daily  For 7 days  Mucinex DM Twice daily  As needed  Cough/congestion  Fluids and rest  Please contact office for sooner follow up if symptoms do not improve or worsen or seek emergency care  follow up Dr. Sood  As planned and As needed     

## 2011-01-24 NOTE — Assessment & Plan Note (Signed)
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  These are usually  steroid responsive.  The patient had a recent flare earlier in the month due  to pneumonia.  He presented on August 16 with complaints of cough with  mucopurulent sputum.  He was treated with Avelox and prednisone.  He  subsequently developed dizziness which he was told was labyrinthitis;  however, this was temporarily related to his use of Avelox and I suspect  that he had a side effect from this medication.   The patient, today, presents without any complaints whatsoever.  His  respiratory issues are now resolved.   CURRENT MEDICATIONS:  Are as noted on the intake sheet.  These have been  reviewed and are accurate.   PHYSICAL EXAMINATION:  VITAL SIGNS:  Are as noted.  Oxygen saturation is 96%  on room air.  GENERAL:  This is a very well-developed, well-nourished, elderly male who is  in no acute distress.  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  NECK:  Supple.  No adenopathy noted.  No JVD.  LUNGS:  Clear to auscultation bilaterally.  CARDIAC:  Regular rate, rhythm.  No rubs, murmurs, gallops heard.  EXTREMITIES:  The patient has no cyanosis, no clubbing, no edema noted.   We did obtain a chest x-ray today, which shows improvement on his previously  noted right lower lobe infiltrate.   IMPRESSION:  1. Recent community-acquired pneumonia, status post therapy.  The patient      markedly improved.  2. 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   PLAN:  1. Plan will be to decrease the patient's prednisone back to the 5  mg      every other day.  2. Followup will be in 2 months' time with chest x-ray at that time.                                   Gailen Shelter, MD   CLG/MedQ  DD:  05/11/2006  DT:  05/11/2006  Job #:  [id number]

## 2011-01-24 NOTE — Cardiovascular Report (Signed)
[place]  Patient:    [name], [name]                         MRN: [id number] Proc. Date: 10/14/99 Adm. Date:  [id number] Disc. Date: [id number] Attending:  Waldo Laine CC:         Mahala Menghini. Evonnie Dawes, M.D. [facility]             Daisey Must, M.D. [facility]             Cardiac Catheterization Laboratory                        Cardiac Catheterization  PROCEDURES PERFORMED:  Left heart catheterization with coronary angiography, left ventriculography, and abdominal aortography.  INDICATIONS:  Mr. Kuhl is a 77 year old male with a history of nocturnal chest  pain.  An exercise Cardiolite revealed posterolateral ischemia.  The patient was originally advised to undergo cardiac catheterization, however, he initially refused.  He subsequently presented to the [facility] Emergency Room with chest pain, where he was admitted and ruled in for a non-Q wave myocardial infarction. Following this event, he consented to undergo a cardiac catheterization.  PROCEDURAL NOTE:  A 6-French sheath was placed in the right femoral artery. Standard Judkins 6-French catheters were utilized.  Contrast was Omnipaque. There were no complications.  RESULTS:  HEMODYNAMICS:  Left ventricular pressure 150/25.  Aortic pressure 142/72.  No aortic valve gradient.  LEFT VENTRICULOGRAM:  There is mild hypokinesis of the posterolateral wall. Ejection fraction is calculated at 51%.  There is 1+ mild mitral regurgitation.   LEFT SUBCLAVIAN ARTERIOGRAM:  This reveals a kink in the proximal left subclavian artery, with no obstructive plaque.  The left internal mammary artery is patent.  ABDOMINAL AORTOGRAM:  This reveals a patent abdominal aorta, iliac arteries, and renal arteries.  CORONARY ARTERIOGRAPHY (Right dominant):  The coronaries are very calcified.  1. The left main has an ostial 50% stenosis, followed by a diffuse 50% stenosis  in the body.  2. The left  anterior descending artery has a proximal 30% stenosis, followed by a    90% stenosis in the proximal vessel just prior to the origin of the normal    sized first diagonal branch.  The mid LAD has a 90% stenosis, followed by a 0%    stenosis.  The distal LAD has a tubular 60% stenosis extending across the origin    of a small second diagonal branch.  The first diagonal is normal sized with  40% stenosis at its origin.  The second and third diagonal branches are small.  3. The left circumflex gives rise to a single large bifurcating obtuse marginal    branch.  There is a 50% stenosis in the proximal vessel, followed by a 99%    stenosis at the origin of the large OM-1.  There is TIMI-2 flow beyond this    lesion.  The OM-1 itself has a 30% stenosis, followed by a diffuse 25% stenosis.  4. The right coronary artery is a dominant vessel.  It has a tubular 80% stenosis    in the proximal vessel, followed by a diffuse 30% stenosis in the mid vessel.    There is a normal sized posterior descending artery and three small    posterolateral branches.  IMPRESSIONS: 1. Mildly decreased left ventricular systolic function. 2. Severe three-vessel coronary artery disease, as described.  PLAN:  The  patient will be referred for coronary artery bypass grafting.  He will be maintained on Lovenox until the time of surgery. DD:  10/14/99 TD:  10/14/99 Job: [id number]

## 2011-01-24 NOTE — Op Note (Signed)
[facility]  Patient:    [name], [name]                         MRN: [id number] Proc. Date: 12/21/00 Adm. Date:  [id number] Attending:  Laqueta Jean CC:         Claretta Fraise, M.D.  Julieanne Cotton, M.D.   Operative Report  PREOPERATIVE DIAGNOSES:  Small left renal cell carcinoma.  POSTOPERATIVE DIAGNOSES:  Small left renal cell carcinoma.  OPERATION PERFORMED:  Partial nephrectomy.  SURGEON:  Dr. Patsi Sears.  ASSISTANT:  Dr. Barron Alvine.  ESTIMATED BLOOD LOSS:  Less than 500 cc.  PREPARATIONS:  After appropriate preanesthesia, the patient was brought to the operating room and placed on the operating table in the dorsal supine position where general endotracheal anesthesia was introduced. The left side was marked preoperatively, and x-rays were available with interpretation in the operating room. The patient has a small 1.7 cm exophytic lower pole lateral renal cell carcinoma by biopsy on previous hospitalization at [facility]. He is now for partial nephrectomy.  DESCRIPTION OF PROCEDURE:  The left flank was prepped with Betadine solution and draped in the usual fashion.  The area of the left twelfth rib was marked, an incision, measuring approximately 8 cm was then made over the tip of the twelfth rib, and the subcutaneous tissue dissected with the electrosurgical unit. The tip of the twelfth rib was dissected using the Alexander retraction, and rib cutters. Once the rib was cut, the flank was incised into the retroperitoneum. Care was taken to avoid any injury to the peritoneum, or to the spleen. The kidney was then dissected, and area of calcification noted on the lower pole kidney was identified, consistent with an area of calcification on the surface of the kidney. The renal tumor was lateral to this, and was identified with dissection of the fat on the kidney. The tissue was then dissected, the tumor removed, and  the base of the kidney was coagulated with the electron beam coagulator. The base of the tumor was sent to the laboratory for a frozen section evaluation. The frozen section eventually showed that the patient had highly suspicious margin, but at that time (35 minutes) the patient was closed. Also, it is noted that the dissection was felt to carry into the collection system. The collection system was closed with a single 3-0 Vicryl suture, and indigo carmine was given, and while the urine turned blue, there was no blue drainage from the kidney. Coagulum was also used with the Gelfoam, to cover the area of dissection, and not bleeding was noted at the end of the case. The wound was irrigated, and there was no evidence of any pleural injury. It was elected not to drain the kidney because there was no blue indigo carmine seen to indicate a leak.  The wound was closed in the following fashion:  CLOSURE:  The wound was closed in multiple layers with #1 PDS suture, including the transverse abdominis muscle, the fascia of the internal oblique, and the external oblique. The rib bed, and external iliac were both closed with #1 PDS running suture. The Marcaine pump was placed in the flank, and the skin was closed with skin staples. The patient was awakened and taken to the recovery room after being given 15 mg of Toradol IV as well as a B&O suppository. He is awakened and taken to the recovery room in good condition. DD:  12/21/00 TD:  12/21/00 Job: [id number]

## 2011-01-24 NOTE — H&P (Signed)
NAME:  [name], [name]                          ACCOUNT NO.:  [id number]   MEDICAL RECORD NO.:  [id number]                   PATIENT TYPE:  EMS   LOCATION:  [facility]                                 FACILITY:  [facility]   PHYSICIAN:  Doylene Canning. Ladona Ridgel, M.D.               DATE OF BIRTH:  1923/09/01   DATE OF ADMISSION:  05/18/2003  DATE OF DISCHARGE:                                HISTORY & PHYSICAL   ADMISSION DIAGNOSIS:  Unstable angina.   CHIEF COMPLAINT:  Chest tightness, shortness of breath, and left arm and  hand tingling.   HISTORY OF PRESENT ILLNESS:  The patient is a very pleasant 77 year old man  who has a history of known coronary artery disease, status post non-Q wave  myocardial infarction back in February of 2001.  At that time, he was found  to have three vessel coronary artery disease and very minimal LV dysfunction  with an EF of 50%. He underwent bypass surgery without particular  difficulty.  He was subsequently found to have renal cell carcinoma and this  was removed by Sigmund I. Patsi Sears, M.D.  The patient has done well with  regard to his cardiac disease until the day of admission. He was awakened  from sleep at 1 a.m. and had chest tightness associated with shortness of  breath.  This was also associated with tingling in the left arm and hand.  With nitroglycerin his symptoms resolved.  He was admitted for additional  evaluation.  The patient denies nausea, vomiting, fevers, chills, or night  sweats. He denies arthritic complaints. He denies syncope or claudication.  He denies any skin changes.   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 He had nasal surgery. He had a  TURP.  He is status post left partial nephrectomy.   SOCIAL HISTORY:  The patient is married. He denies tobacco or ethanol use.  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   FAMILY HISTORY:  Noncontributory at his  age.   REVIEW OF SYSTEMS:  Overall, the patient has episodes of knee discomfort,  but denies joint swelling.  The patient has reduced auditory acuity in both  ears. He does not see out of his left eye secondary to a tumor that was  treated as a child. He denies nausea, vomiting, diarrhea, or constipation.  He denies problems with swallowing.  Except as previously noted in the HPI,  he has had no chest pain or other cardiopulmonary symptoms until today.  He  denies polyuria or polydipsia. He denies any recent weight changes. He  denies any neurologic symptoms. The rest of his review of systems was all  negative.   PHYSICAL EXAMINATION:  GENERAL: He is a pleasant, elderly appearing man in  no acute distress.  VITAL SIGNS: Blood pressure today was 143/72 with a pulse  of 73 and regular,  respirations 18.  HEENT:  Normocephalic and atraumatic.  His left eye does not move.  Oropharynx is moist. The sclerae were anicteric.  NECK:  No jugular venous distention. There is no thyromegaly.  There are  very faint carotid bruits.  Trachea was midline.  LUNGS:  Clear bilaterally with no wheezes, rales, or rhonchi.  HEART:  Regular rate and rhythm with a normal S1 and S2. I do not appreciate  an S3 or S4 gallop, nor did I hear any murmurs today.  ABDOMEN:  Soft, nontender, and nondistended. There was no organomegaly.  EXTREMITIES: No cyanosis, clubbing, or edema.  Pulses 2+ and symmetric.  NEUROLOGY:  He was alert and oriented x3 with Cranial nerves II-XII grossly  intact.  Strength was 5/5 and symmetric.   EKG demonstrates normal sinus rhythm with sinus bradycardia, but otherwise  normal. There are nonspecific lateral ST T abnormalities.   Initial cardiac enzymes were negative.   IMPRESSION:  1. Recurrent episode of chest tightness with shortness of breath and left     arm discomfort.  2. Status post bypass surgery in 2001.  3. History of renal cell carcinoma, status post partial  nephrectomy.   DISCUSSION:  The patient's new onset symptoms are concerning for unstable  angina.  His symptoms are somewhat atypical and the patient is a difficult  historian.  I would recommend that he be admitted to the hospital and  undergo serial cardiac enzymes evaluation. Will treat him with aspirin, beta  blockers, and heparin initially.  If his cardiac enzymes are negative and  his symptoms are resolved, then I would recommend discharge with outpatient  Cardiolite in our office in the next several days.  If his enzymes are  positive, then I would recommend catheterization.                                                Doylene Canning. Ladona Ridgel, M.D.    GWT/MEDQ  D:  05/18/2003  T:  05/18/2003  Job:  [id number]   cc:   Olga Millers, M.D.   Titus Dubin. Alwyn Ren, M.D. [facility]

## 2011-01-24 NOTE — Assessment & Plan Note (Signed)
[facility]                            CARDIOLOGY OFFICE NOTE   NAME:[name]                       MRN:          [id number]  DATE:09/21/2006                            DOB:          07-12-1923    [name] is a gentleman that I have seen for bradycardia status post  pacemaker placement and coronary disease. I last saw him on September 17, 2006. Please refer to my note for details. Since that time, he has felt  well with no dyspnea, chest pain, palpitations or syncope and his  weakness has improved. There is no pedal edema. However, he has checked  his pulse at home and it is occasionally 40. He called the office today  and was very concerned about this and wanted to be added on. Note there  is no syncope.   MEDICATIONS:  1. Multivitamin daily.  2. Vitamin E.  3. Lipitor 80 mg p.o. daily.  4. Cozaar 50 mg p.o. daily.  5. Lasix 20 mg p.o. daily.  6. Prednisone 20 mg p.o. daily.  7. Coenzyme Q.  8. Flax seed oil.  9. Aspirin.  10.Digestive enzymes.   PHYSICAL EXAMINATION:  VITAL SIGNS:  Blood pressure 150/60 and his pulse  is 83. He weighs 169 pounds.  NECK:  Supple.  CHEST:  Clear.  CARDIOVASCULAR:  Regular rate and rhythm.  EXTREMITIES:  Show no edema.   His electrocardiogram shows a sinus rhythm at a rate of 83. There is a  left anterior fascicular block and left ventricular hypertrophy. A prior  septal infarct cannot be excluded. Followup electrocardiogram revealed  atrial pacing with ventricular bigeminy.   DIAGNOSES:  1. Status post pacemaker placement secondary to heart block.  2. Hypertension.  3. Hyperlipidemia.  4. Pulmonary infiltrate secondary to immune vasculitis.  5. History of partial nephrectomy secondary to renal cell carcinoma.  6. History of prostate cancer.  7. History of cough __________ .  8. History of bronchiectasis.   PLAN:  [name] is concerned about bradycardia that he has noted on  physical  examination at home and by his monitor which takes his blood  pressure. However, it appears that this is most likely related to his  ventricular bigeminy where he is not palpating his PVCs. We will have  his pacemaker checked today. Note he otherwise feels fine with no  dyspnea, chest pain or syncope. I have explained to him that this is  benign. His blood pressure is elevated today and we will increase his  Cozaar to 100 mg p.o. daily. We will check a BMET in 1 week to follow  his potassium and renal function. He is scheduled for an echocardiogram  given his recent pedal edema. I will see him back as scheduled.     Madolyn Frieze Jens Som, MD, [facility]  Electronically Signed    BSC/MedQ  DD: 09/21/2006  DT: 09/22/2006  Job #: [id number]

## 2011-01-24 NOTE — Assessment & Plan Note (Signed)
[facility]                               [name], [name]                       MRN:          [id number]  DATE:04/23/2006                            DOB:          12/27/1922    Mr. Senters is a very pleasant, 77 year old gentleman, who I see here for a  pauciimmune vasculitis.  The patient presents today with the complaint of  cough productive of greenish sputum.  He feels generalized malaise.  He has  had no hemoptysis, has had again, as noted, mucopurulent sputum.  The  patient denies any other symptomatology.  I do not have his chart available  for my review today.   CURRENT MEDICATIONS:  As noted on the progress note.   PHYSICAL EXAMINATION:  VITAL SIGNS:  Noted on the progress note.  The  patient is afebrile.  GENERAL:  This is an elderly, white male, who is in no acute distress.  He  is fully ambulatory.  He does appear somewhat acutely ill but not toxic.  HEENT:  Remarkable for a prosthetic left eye.  NECK:  Supple, no adenopathy noted, no JVD.  LUNGS:  He has rhonchi over the right lower lung field.  The left is clear.  CARDIAC:  Regular rate and rhythm, no rubs, murmurs, or gallops heard.  EXTREMITIES:  The patient has no cyanosis, no clubbing, no edema noted.   I did obtain a chest x-ray, which shows a right lower lobe patchy infiltrate  that is consistent with bronchial pneumonia.   IMPRESSION:  1. Community-acquired pneumonia a patient with pauciimmune vasculitis.  2. Pauciimmune vasculitis with flare secondary to the above.   PLAN:  1. Place the patient on prednisone taper.  He is currently on 5 mg every      other day of prednisone, but we will give him a boost during this      current illness.  2. We will treat him with Avelox 400 mg daily x7 days.  3. Followup will be in one to two weeks time.  He is to contact us prior      to that time should any problems arise.      Gailen Shelter, MD   CLG/MedQ  DD:  05/08/2006  DT:  05/08/2006  Job #:  [id number]

## 2011-01-24 NOTE — Progress Notes (Signed)
Subjective:    Patient ID: [name], male    DOB: [birth year], 77 y.o.   MRN: [id number]  HPI 77 year old male with known history of pauci-immune vasculitis with pulmonary fibrosis, hypoxemia, and sleep apnea unable to tolerate BIPAP.   01/24/11 Acute OV  Presents for work in visit. Complains of increased SOB, prod cough with yellow/green mucus, sinus congestion with yellow/green mucus x2days. Started mucinex with some help. NO recent abx or travel  . NO chest pain or edema.  Cough is getting worse today . Some sinus drainage.      Past Medical History   Diagnosis  Date   .  Pauci-immune vasculitis      3/02 P-ANCA>1:640, Myeloperoxidase Ab 17.6, ANA neg, RPR negative, RF<20. 7/09 ANCA<1:20, PFT 7/09 FEV1 1.90(81%), FVC 2.66 (71%), TLC 5.08 (87%), DLCO 42%, MTX started 06/18/09   .  Pulmonary fibrosis    .  Community acquired pneumonia    .  Chronic hypoxemic respiratory failure      4 liters oxygen   .  Chronic rhinitis    .  Prostate cancer      s/p external radiation and hormonal therapy   .  Renal cell carcinoma    .  History of enucleation of left eyeball      age 77   .  Congestive heart failure      Dr. Jens Som: ECHO 07/09 EF 50%   .  Coronary artery disease    .  Ventricular tachyarrhythmia    .  Second degree AV block    .  Aortic regurgitation    .  Hypertension    .  Hyperlipidemia    .  Anxiety    .  Osteoporosis    .  OSA (obstructive sleep apnea)    .  Bursitis of elbow    .  Gout    .  Thyroid cyst    .  GERD (gastroesophageal reflux disease)     Family History   Problem  Relation  Age of Onset   .  Coronary artery disease  Father    .  Tuberculosis  Father    .  Colon cancer  Mother     History    Social History   .  Marital Status:  Widowed     Spouse Name:  N/A     Number of Children:  3   .  Years of Education:  N/A    Occupational       Review of Systems Constitutional:   No  weight loss, night sweats,  Fevers, chills,   HEENT:    No headaches,  Difficulty swallowing,  Tooth/dental problems, or  Sore throat,                No sneezing, itching, ear ache, nasal congestion, post nasal drip,   CV:  No chest pain,  Orthopnea, PND, swelling in lower extremities, anasarca, dizziness, palpitations, syncope.   GI  No heartburn, indigestion, abdominal pain, nausea, vomiting, diarrhea, change in bowel habits, loss of appetite, bloody stools.   Resp: No excess mucus, no productive cough,  No non-productive cough,  No coughing up of blood.    No wheezing.  No chest wall deformity  Skin: no rash or lesions.  GU: no dysuria, change in color of urine, no urgency or frequency.  No flank pain, no hematuria   MS:  No joint pain or swelling.  No decreased range of motion.  Psych:  No change in mood or affect. No depression or anxiety.  No memory loss.         Objective:   Physical Exam GEN: A/Ox3; pleasant , NAD, elderly chronically ill appearing.   HEENT:  /AT,  EACs-clear, TMs-wnl, NOSE-clear, THROAT-clear, no lesions, no postnasal drip or exudate noted.   NECK:  Supple w/ fair ROM; no JVD; normal carotid impulses w/o bruits; no thyromegaly or nodules palpated; no lymphadenopathy.  RESP  Coarse BS w/ few rhonchino accessory muscle use, no dullness to percussion  CARD:  RRR, no m/r/g  , no peripheral edema, pulses intact, no cyanosis or clubbing.  GI:   Soft & nt; nml bowel sounds; no organomegaly or masses detected.  Musco: Warm bil, no deformities or joint swelling noted.   Neuro: alert, no focal deficits noted.    Skin: Warm, no lesions or rashes         Assessment & Plan:

## 2011-01-24 NOTE — Assessment & Plan Note (Signed)
[facility]                            CARDIOLOGY OFFICE NOTE   NAME:Benham, DEQUINCY BORN                       MRN:          [id number]  DATE:09/17/2006                            DOB:          Feb 12, 1923    Mr. Fleck was recently admitted to [facility] with complaints  of fatigue and not feeling himself.  His heart rate was in the 40s and  50s, and his electrocardiogram revealed heart block with Wenckebach  apparently.  Dr. Graciela Husbands saw the patient in consultation and the patient  subsequently had a pacemaker placed.  He did rule out for myocardial  infarction with serial enzymes.  Following discharge, he did have an  episode of pedal edema, more than usual, after driving to the beach.  He  was placed on Lasix.  Since then, there is no dyspnea, chest pain,  palpitations or syncope.   His medications at present include multivitamin 1 p.o. daily, vitamin E,  Lipitor 80 mg p.o. daily, Cozaar 50 mg p.o. daily, Lasix 20 mg p.o.  daily, prednisone p.o. daily, coenzyme Q, flax seed oil, aspirin 81 mg  p.o. daily, and digestive enzymes.   PHYSICAL EXAMINATION:  Shows a blood pressure of 170/80.  His pulse is  70.  His neck is supple.  There is a right carotid bruit.  His chest is clear.  His cardiovascular exam reveals a regular rate and rhythm.  His extremities show no edema.   His electrocardiogram shows a sinus rhythm at a rate of 70.  There is  occasional atrial pacing.  There is left ventricular hypertrophy and a  prior septal infarct cannot be excluded.  Note, he did have a BMET  performed on September 10, 2006 after a week on Lasix.  His potassium was  4.3, and his BUN and creatinine were 21 and 1.3.  Also of note, he did  have a cholesterol check in early December when he was in the hospital,  and his LDL was 125.   DIAGNOSES:  1. Status post pacemaker secondary to heart block.  2. Hyperlipidemia.  3. Hypertension.  4. Recent pedal edema.  5. 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  6. History of partial nephrectomy secondary to renal cell carcinoma.  7. History of prostate cancer.  8. History of cough with ACE inhibition.  9. History of bronchiectasis.   PLAN:  Mr. Maahs appears to be doing well from a symptomatic standpoint.  We will continue his present medications here.  His blood pressure is  elevated today, but he will continue to track this at home.  If it  continues to be elevated, we will increase his Cozaar.  Note, his renal  function and potassium were normal after the addition of his Lasix.  We  will plan to repeat an echocardiogram given the recent increase in his  pedal edema.  His LDL was greater than 70, and I suggested Zetia today,  but he is unwilling to try this.  He will otherwise continue with risk  factor modification, and he will be followed in the pacemaker  clinic.  I  will see him back in 3 months.     Madolyn Frieze Jens Som, MD, [facility]  Electronically Signed    BSC/MedQ  DD: 09/17/2006  DT: 09/17/2006  Job #: [id number]

## 2011-01-24 NOTE — H&P (Signed)
[facility]  Patient:    [name], [name]                         MRN: [id number] Adm. Date:  [id number] Attending:  Laqueta Jean CC:         Claretta Fraise, M.D.  Pollyann Savoy, M.D.   History and Physical  HISTORY OF PRESENT ILLNESS:  [name] is a 77 year old married white male from [place], [place], recently admitted to [facility] on November 22, 2000 with a two-week history of hemoptysis and pneumonia treated with Ceftin, and subsequently Cipro.  The patient developed polyarthralgia, right elbow and right upper extremity pain, worsening with movement.  In the emergency room, the patient was barely able to raise his head and his symptoms resolved overnight but recurred in the next 36 hours in the left upper extremity and elbow.  He also complained of pain in the soles of his feet causing mobilization with limping.  He was admitted with rheumatology evaluation and abdominal CT and ultrasound.  This showed a 17 mm left lower pole exophytic renal mass, appearing solid and possibly complex and cystic in nature.  The patient underwent biopsy of the mass, showing it was renal cell carcinoma, and is now admitted to the hospital for partial nephrectomy.  PAST MEDICAL HISTORY: 1. TURP (remote history, ? M.D.). 2. Left eye malignancy. 3. Septoplasty. 4. Right tympanoplasty--removed. 5. Right ear deafness. 6. Left tympanic membrane implantation. 7. Coronary bypass graft 2001. 8. Gastroesophageal reflux disease.  ALLERGIES:  PENICILLIN.  REVIEW OF SYSTEMS:  Significant for a history of hemoptysis.  SOCIAL HISTORY:  The patient is married.  The patient is an Technical sales engineer.  He uses no tobacco or alcohol.  FAMILY HISTORY:  Significant for father with heart attack and TB, mother with colon cancer.  DIET:  Low fat and low salt.  MEDICATIONS: 1. Folic acid 1 mg per day. 2. Baby aspirin 81 mg per day (on hold). 3. Pravachol 20 mg  h.s. 4. Altace 5 mg per day.  PHYSICAL EXAMINATION:  GENERAL:  A well-developed white male in no acute distress.  VITAL SIGNS:  Blood pressure 134/70, respirations 20, pulse 84, temperature 97.3.  O2 saturation 96%.  HEENT:  PERRL, EOM full.  NECK:  Supple and nontender.  CHEST:  Distant breath sounds.  FLANK:  No CVA pain.  ABDOMEN:  Soft.  Positive bowel sounds without organomegaly and without masses.  GENITALIA:  Normal male external genitalia.  RECTAL:  As per examination of November 26, 2000.  NEUROLOGIC:  Physiologic.  IMPRESSION:  Status post partial nephrectomy for a small, exophytic low-pole left renal mass. DD:  12/21/00 TD:  12/21/00 Job: [id number]

## 2011-01-24 NOTE — Op Note (Signed)
NAME:  [name], [name]                ACCOUNT NO.:  [id number]   MEDICAL RECORD NO.:  [id number]          PATIENT TYPE:  OUT   LOCATION:  CARD                         FACILITY:  [facility]   PHYSICIAN:  Oley Balm. Sung Amabile, M.D. [facility] OF BIRTH:  August 27, 1923   DATE OF PROCEDURE:  08/12/2005  DATE OF DISCHARGE:  08/08/2005                                 OPERATIVE REPORT   INDICATION FOR TESTING:  Dyspnea.   PROCEDURE:  Cardiopulmonary stress testing was performed on a graded  treadmill. Testing was stopped due to dyspnea and heart rate goal. Effort  was maximal. At peak exercise oxygen uptake was 19.7 mL/kg/min or 123% of  predicted maximum indicating normal exercise tolerance.   At peak exercise, heart rate was 151 beats/min or 107% of predicted maximum  indicating that cardiovascular limitation was reached. Oxygen pulse was  normal, suggesting normal left ventricular function. Blood pressure response  was normal. EKG tracings revealed no abnormalities.   At peak exercise minute ventilation was 77.4 L/min or 72% of maximum  voluntary ventilation indicating that ventilatory limitation was approached  but not reached. Gas exchange parameters revealed increased dead space and  mild hypoxemia (desaturation to 90%). Baseline pulmonary function tests  revealed minimal obstruction, mild restriction, normal diffuse capacity.  Postexercise spirometry revealed no exercise-induced bronchospasm.   SUMMARY:  Normal exercise tolerance. Limiting factor is the cardiovascular  system which is normal. However, pulmonary function testing revealed a mild  obstruction/restriction pattern. During exercise there was increased dead  space ventilation and mild desaturation. This is a nonspecific pattern of  exercise testing but suggests mild underlying lung disease. One could  consider a trial of bronchodilator therapy. Also, weight loss and  reconditioning might be indicated.     ______________________________  Oley Balm. Sung Amabile, M.D. [facility]     DBS/MEDQ  D:  08/12/2005  T:  08/13/2005  Job:  [id number]   cc:   Danice Goltz, M.D. [facility]  [address] [place], [place] [address]   Cardiopulmonary Department

## 2011-02-18 ENCOUNTER — Other Ambulatory Visit: Payer: Self-pay | Admitting: Family Medicine

## 2011-03-06 ENCOUNTER — Encounter: Payer: Self-pay | Admitting: Pulmonary Disease

## 2011-03-06 ENCOUNTER — Ambulatory Visit (INDEPENDENT_AMBULATORY_CARE_PROVIDER_SITE_OTHER)
Admission: RE | Admit: 2011-03-06 | Discharge: 2011-03-06 | Disposition: A | Discharge status: Home / Self Care | Payer: Medicare Other | Source: Ambulatory Visit | Attending: Pulmonary Disease | Admitting: Pulmonary Disease

## 2011-03-06 ENCOUNTER — Ambulatory Visit (INDEPENDENT_AMBULATORY_CARE_PROVIDER_SITE_OTHER): Payer: Medicare Other | Admitting: Pulmonary Disease

## 2011-03-06 ENCOUNTER — Other Ambulatory Visit (INDEPENDENT_AMBULATORY_CARE_PROVIDER_SITE_OTHER): Payer: Medicare Other

## 2011-03-06 VITALS — BP 90/58 | HR 83 | Temp 97.4°F | Ht 66.0 in | Wt 157.8 lb

## 2011-03-06 DIAGNOSIS — J8410 Pulmonary fibrosis, unspecified: Secondary | ICD-10-CM

## 2011-03-06 DIAGNOSIS — I509 Heart failure, unspecified: Secondary | ICD-10-CM

## 2011-03-06 DIAGNOSIS — J9610 Chronic respiratory failure, unspecified whether with hypoxia or hypercapnia: Secondary | ICD-10-CM

## 2011-03-06 DIAGNOSIS — R0602 Shortness of breath: Secondary | ICD-10-CM

## 2011-03-06 LAB — COMPREHENSIVE METABOLIC PANEL
ALT: 15 U/L (ref 0–53)
AST: 18 U/L (ref 0–37)
Alkaline Phosphatase: 78 U/L (ref 39–117)
BUN: 19 mg/dL (ref 6–23)
Chloride: 98 mEq/L (ref 96–112)
Creatinine, Ser: 1.8 mg/dL — ABNORMAL HIGH (ref 0.4–1.5)

## 2011-03-06 LAB — CBC WITH DIFFERENTIAL/PLATELET
Basophils Absolute: 0.1 10*3/uL (ref 0.0–0.1)
Basophils Relative: 1.3 % (ref 0.0–3.0)
Eosinophils Absolute: 0.3 10*3/uL (ref 0.0–0.7)
HCT: 36.3 % — ABNORMAL LOW (ref 39.0–52.0)
Hemoglobin: 12.6 g/dL — ABNORMAL LOW (ref 13.0–17.0)
Lymphs Abs: 0.7 10*3/uL (ref 0.7–4.0)
MCHC: 34.7 g/dL (ref 30.0–36.0)
MCV: 95.1 fl (ref 78.0–100.0)
Monocytes Absolute: 0.5 10*3/uL (ref 0.1–1.0)
Neutro Abs: 5.1 10*3/uL (ref 1.4–7.7)
RBC: 3.82 Mil/uL — ABNORMAL LOW (ref 4.22–5.81)
RDW: 14.5 % (ref 11.5–14.6)

## 2011-03-06 MED ORDER — PREDNISONE 10 MG PO TABS: ORAL_TABLET | ORAL | Status: AC

## 2011-03-06 NOTE — Assessment & Plan Note (Signed)
He has increased dyspnea.  Will give him short course of prednisone.  Continue current dose of MTX for now.  Will repeat labs and chest xray, and call him with results.

## 2011-03-06 NOTE — Progress Notes (Signed)
Addended by: Michel Bickers A on: 03/06/2011 02:45 PM   Modules accepted: Orders

## 2011-03-06 NOTE — Patient Instructions (Signed)
Chest xray and lab tests today Prednisone 10 mg pills: 3 pills for 3 days, 2 pills for 3 days, 1 pill for 3 days, 1/2 pill for 3 days Follow up in 2 months

## 2011-03-06 NOTE — Progress Notes (Signed)
Subjective:    Patient ID: [name], male    DOB: [birth year], 77 y.o.   MRN: [id number]  HPI  77 year old male with known history of pauci-immune vasculitis with pulmonary fibrosis, hypoxemia, and sleep apnea unable to tolerate BIPAP.  More short of breath, 3 weeks, no cough, occ sneeze, no chest pain, no fever, no leg swelling.  No difference with proair.  Using xanax occasionally.  Review of Systems     Objective:   Physical Exam  BP 90/58  Pulse 83  Temp(Src) 97.4 F (36.3 C) (Oral)  Ht 5\' 6"  (1.676 m)  Wt 157 lb 12.8 oz (71.578 kg)  BMI 25.47 kg/m2  SpO2 96%  GEN: A/Ox3; chronically ill appearing.  HEENT: [place]/AT, , EACs-clear, TMs-wnl, NOSE-clear, THROAT-mild redness, no exudate  NECK: Supple w/ fair ROM; no JVD; normal carotid impulses w/o bruits; no thyromegaly or nodules palpated; right cervical adenopathy.  RESP Coarse BS w/ few rhonchi , no wheezing  CARD: RRR, no m/r/g  GI: Soft & nt; nml bowel sounds; no organomegaly or masses detected.  Musco: Warm bil, no calf tenderness clubbing, pulses intact , trace edema.  Neuro: intact        Assessment & Plan:   Postinflammatory pulmonary fibrosis He has increased dyspnea.  Will give him short course of prednisone.  Continue current dose of MTX for now.  Will repeat labs and chest xray, and call him with results.  Chronic respiratory failure He is to continue on 3 to 4 liters oxygen 24/7.      Updated Medication List Outpatient Encounter Prescriptions as of 03/06/2011  Medication Sig Dispense Refill  . albuterol (PROAIR HFA) 108 (90 BASE) MCG/ACT inhaler Inhale 2 puffs into the lungs every 6 (six) hours as needed.        . ALPRAZolam (XANAX) 0.25 MG tablet Take 0.25 mg by mouth 3 (three) times daily as needed.        Marland Kitchen aspirin 81 MG tablet Take 81 mg by mouth daily.        Marland Kitchen atorvastatin (LIPITOR) 80 MG tablet Take 80 mg by mouth daily.        . Calcium Carbonate-Vitamin D (CALCIUM 600/VITAMIN D) 600-400  MG-UNIT per tablet Take 2 tablets by mouth daily.        . carvedilol (COREG) 12.5 MG tablet Take 12.5 mg by mouth 2 (two) times daily with a meal.        . Coenzyme Q10 (COQ10) 100 MG CAPS Take 1 capsule by mouth daily.        Marland Kitchen dextromethorphan-guaiFENesin (MUCINEX DM) 30-600 MG per 12 hr tablet 1 every 12 hrs as needed      . folic acid (FOLVITE) 1 MG tablet Take 1 mg by mouth daily.        . furosemide (LASIX) 40 MG tablet TAKE 4 TABLETS BY MOUTH TWICE DAILY  240 tablet  0  . isosorbide mononitrate (IMDUR) 60 MG 24 hr tablet TAKE 1 TABLET BY MOUTH DAILY  30 tablet  10  . methotrexate (RHEUMATREX) 2.5 MG tablet Take 3 tablets by mouth every Tuesday       . mometasone (NASONEX) 50 MCG/ACT nasal spray 2 sprays by Nasal route daily. 2 sprays daily as needed  17 g  6  . Multiple Vitamins-Minerals (MULTIVITAMIN WITH MINERALS) tablet Take 1 tablet by mouth daily.        . nitroGLYCERIN (NITROSTAT) 0.4 MG SL tablet Place 0.4 mg under the tongue  every 5 (five) minutes as needed.        . potassium chloride SA (K-DUR,KLOR-CON) 20 MEQ tablet Take 2 tablets in AM and 1 tablet in the afternoon       . predniSONE (DELTASONE) 10 MG tablet 3 pills for 3 days, 2 pills for 3 days, 1 pill for 3 days, 1/2 pill for 3 days  20 tablet  0  . DISCONTD: methotrexate (RHEUMATREX) 2.5 MG tablet Take 3 pills per week  12 tablet  6

## 2011-03-06 NOTE — Assessment & Plan Note (Signed)
He is to continue on 3 to 4 liters oxygen 24/7.

## 2011-03-07 ENCOUNTER — Telehealth: Payer: Self-pay | Admitting: Pulmonary Disease

## 2011-03-07 NOTE — Telephone Encounter (Signed)
D/W pt over phone results of CXR and labs from 6/29.  Labs unremarkable, and no change to fibrosis pattern on CXR compared to Aug 2011.  No change to treatment plan as detailed on 6/29.

## 2011-03-14 ENCOUNTER — Ambulatory Visit (INDEPENDENT_AMBULATORY_CARE_PROVIDER_SITE_OTHER): Payer: Medicare Other | Admitting: Internal Medicine

## 2011-03-14 ENCOUNTER — Encounter: Payer: Self-pay | Admitting: Internal Medicine

## 2011-03-14 DIAGNOSIS — I441 Atrioventricular block, second degree: Secondary | ICD-10-CM

## 2011-03-14 DIAGNOSIS — I255 Ischemic cardiomyopathy: Secondary | ICD-10-CM

## 2011-03-14 DIAGNOSIS — I2589 Other forms of chronic ischemic heart disease: Secondary | ICD-10-CM

## 2011-03-14 DIAGNOSIS — Z950 Presence of cardiac pacemaker: Secondary | ICD-10-CM

## 2011-03-14 DIAGNOSIS — I442 Atrioventricular block, complete: Secondary | ICD-10-CM

## 2011-03-14 LAB — PACEMAKER DEVICE OBSERVATION
AL IMPEDENCE PM: 360 Ohm
AL THRESHOLD: 0.5 V
ATRIAL PACING PM: 92.47
BATTERY VOLTAGE: 2.99 V
RV LEAD IMPEDENCE PM: 736 Ohm
VENTRICULAR PACING PM: 0.15

## 2011-03-14 NOTE — Assessment & Plan Note (Addendum)
Stable on current meds; it might be appropriate to begin ACE therapy  Will defer to Dr [facility]

## 2011-03-14 NOTE — Assessment & Plan Note (Signed)
The patient's device was interrogated.  The information was reviewed. No changes were made in the programming.    

## 2011-03-14 NOTE — Assessment & Plan Note (Signed)
Stable post pacing 

## 2011-03-14 NOTE — Patient Instructions (Signed)
Your physician wants you to follow-up in: 6 months with Dr. Jens Som. (January 2013) You will receive a reminder letter in the mail two months in advance. If you don't receive a letter, please call our office to schedule the follow-up appointment.  Your physician recommends that you continue on your current medications as directed. Please refer to the Current Medication list given to you today.

## 2011-03-14 NOTE — Progress Notes (Signed)
HPI  [name] is a 77 y.o. male seen in followup for ischemic heart disease with prior CABG, bradycardia and intermittent complete heart block status post pacemaker implantation.  Myoview 2011 showed low risk scan, without ischemia and EF 45% ind infusion perfusion defects    He also is on chronic oxygen because of pulmonary fibrosis and vasculitis. His breathing is relatively stable.  He denies intercurrent chest pain and there've been no palpitations or syncope.    Past Medical History  Diagnosis Date  . Pauci-immune vasculitis     3/02 P-ANCA>1:640, Myeloperoxidase Ab 17.6, ANA neg, RPR negative, RF<20. 7/09 ANCA<1:20, PFT 7/09 FEV1 1.90(81%), FVC 2.66 (71%), TLC 5.08 (87%), DLCO 42%, MTX started 06/18/09  . Pulmonary fibrosis   . Community acquired pneumonia   . Chronic hypoxemic respiratory failure     4 liters oxygen  . Chronic rhinitis   . Prostate cancer     s/p external radiation and hormonal therapy  . Renal cell carcinoma   . History of enucleation of left eyeball     age 26  . Congestive heart failure     Dr. Jens Som: ECHO 07/09 EF 50%  . Coronary artery disease   . Ventricular tachyarrhythmia   . Second degree AV block   . Aortic regurgitation   . Hypertension   . Hyperlipidemia   . Anxiety   . Osteoporosis   . OSA (obstructive sleep apnea)   . Bursitis of elbow   . Gout   . Thyroid cyst   . GERD (gastroesophageal reflux disease)     Past Surgical History  Procedure Date  . Coronary artery bypass graft 2/01    Dr. Sheliah Plane  . Nephrectomy 2002    left partial  . Transurethral resection of prostate   . Septoplasty   . Tympanoplasty   . Tympanic membrane repair     implant  . Pacemaker insertion     12/07  . Kyphosis surgery 4.09    Dr. Colon Branch    Current Outpatient Prescriptions  Medication Sig Dispense Refill  . albuterol (PROAIR HFA) 108 (90 BASE) MCG/ACT inhaler Inhale 2 puffs into the lungs every 6 (six) hours as needed.         . ALPRAZolam (XANAX) 0.25 MG tablet Take 0.25 mg by mouth 3 (three) times daily as needed.        Marland Kitchen aspirin 81 MG tablet Take 81 mg by mouth daily.        Marland Kitchen atorvastatin (LIPITOR) 80 MG tablet Take 80 mg by mouth daily.        . Calcium Carbonate-Vitamin D (CALCIUM 600/VITAMIN D) 600-400 MG-UNIT per tablet Take 2 tablets by mouth daily.        . carvedilol (COREG) 12.5 MG tablet Take 12.5 mg by mouth 2 (two) times daily with a meal.        . Coenzyme Q10 (COQ10) 100 MG CAPS Take 1 capsule by mouth daily.        . folic acid (FOLVITE) 1 MG tablet Take 1 mg by mouth daily.        . furosemide (LASIX) 40 MG tablet TAKE 4 TABLETS BY MOUTH TWICE DAILY  240 tablet  0  . isosorbide mononitrate (IMDUR) 60 MG 24 hr tablet TAKE 1 TABLET BY MOUTH DAILY  30 tablet  10  . methotrexate (RHEUMATREX) 2.5 MG tablet Take 3 tablets by mouth every Tuesday       . mometasone (NASONEX) 50 MCG/ACT  nasal spray 2 sprays by Nasal route daily. 2 sprays daily as needed  17 g  6  . Multiple Vitamins-Minerals (MULTIVITAMIN WITH MINERALS) tablet Take 1 tablet by mouth daily.        . nitroGLYCERIN (NITROSTAT) 0.4 MG SL tablet Place 0.4 mg under the tongue every 5 (five) minutes as needed.        . potassium chloride SA (K-DUR,KLOR-CON) 20 MEQ tablet Take 2 tablets in AM and 1 tablet in the afternoon       . predniSONE (DELTASONE) 10 MG tablet 3 pills for 3 days, 2 pills for 3 days, 1 pill for 3 days, 1/2 pill for 3 days  20 tablet  0  . DISCONTD: dextromethorphan-guaiFENesin (MUCINEX DM) 30-600 MG per 12 hr tablet 1 every 12 hrs as needed      . DISCONTD: methotrexate (RHEUMATREX) 2.5 MG tablet Take 3 pills per week  12 tablet  6    Allergies  Allergen Reactions  . Ciprofloxacin     myalgia  . Levofloxacin     REACTION: hives  . Penicillins     rash    Review of Systems negative except from HPI and PMH  Physical Exam Well developed and well nourished in no acute distress HENT normal He hasw radio shack ear  phones on E scleral and icterus clear Neck Supple JVP flat; carotids brisk and full deccreadsed breath sounds Regular rate and rhythm, no murmurs gallops or rub Soft with active bowel sounds No clubbing cyanosis and edema Alert and oriented, grossly normal motor and sensory function Skin Warm and Dry    Assessment and  Plan

## 2011-03-19 ENCOUNTER — Other Ambulatory Visit: Payer: Self-pay | Admitting: Pulmonary Disease

## 2011-03-26 ENCOUNTER — Telehealth: Payer: Self-pay | Admitting: Internal Medicine

## 2011-03-26 NOTE — Telephone Encounter (Signed)
LOV faxed to [facility] @ [phone]  03/26/11/km

## 2011-04-21 ENCOUNTER — Other Ambulatory Visit: Payer: Self-pay | Admitting: Family Medicine

## 2011-04-21 NOTE — Telephone Encounter (Signed)
Refill request for Lasix 40 mg, the directions read take 4 tabs bid. I just want to verify that?

## 2011-04-22 NOTE — Telephone Encounter (Signed)
I will defer this refill to Dr. Jens Som, since I have not seen this patient in years

## 2011-04-22 NOTE — Telephone Encounter (Signed)
Please refill patient's lasix. [name]

## 2011-04-24 ENCOUNTER — Other Ambulatory Visit: Payer: Self-pay | Admitting: Family Medicine

## 2011-04-24 NOTE — Telephone Encounter (Signed)
Pt said that [facility] on [address] and [address] [phone] sent a refill req over on Monday 04/21/11 for pts furosemide (LASIX) 40 MG tablet . Pt checked with pharmacy and nothing has been called in yet. Pls call in asap.

## 2011-04-25 NOTE — Telephone Encounter (Signed)
I called the pharmacy twice this week. Dr. Clent Ridges does not handle the refills on this type of medication. I advised pharmacy to send the request to the cardiologist.

## 2011-05-01 ENCOUNTER — Encounter: Payer: Self-pay | Admitting: Pulmonary Disease

## 2011-05-01 ENCOUNTER — Ambulatory Visit (INDEPENDENT_AMBULATORY_CARE_PROVIDER_SITE_OTHER): Payer: Medicare Other | Admitting: Pulmonary Disease

## 2011-05-01 DIAGNOSIS — J9610 Chronic respiratory failure, unspecified whether with hypoxia or hypercapnia: Secondary | ICD-10-CM

## 2011-05-01 DIAGNOSIS — J8410 Pulmonary fibrosis, unspecified: Secondary | ICD-10-CM

## 2011-05-01 NOTE — Patient Instructions (Signed)
Follow up in 3 months

## 2011-05-01 NOTE — Assessment & Plan Note (Signed)
He had improved in his dyspnea after treatment with prednisone.  He is now on prednisone for his ear, but thinks this is helping his breathing also.  Advised him to call once his current course of prednisone is completed to determine if he may need to remain on low dose prednisone with methotrexate or increase dose of methotrexate.

## 2011-05-01 NOTE — Progress Notes (Signed)
Subjective:    Patient ID: [name], male    DOB: 06-21-1923, 77 y.o.   MRN: [id number]  HPI 77 year old male with known history of pauci-immune vasculitis with pulmonary fibrosis, hypoxemia, and sleep apnea unable to tolerate BIPAP.  He felt improvement after course of prednisone from last visit.  He is currently using prednisone for his ear problem.  He thinks his breathing is some better with this course of prednisone.    He gets occasional cough, but not much sputum.  He denies sore throat, or skin rashes.  Past Medical History  Diagnosis Date  . Pauci-immune vasculitis     3/02 P-ANCA>1:640, Myeloperoxidase Ab 17.6, ANA neg, RPR negative, RF<20. 7/09 ANCA<1:20, PFT 7/09 FEV1 1.90(81%), FVC 2.66 (71%), TLC 5.08 (87%), DLCO 42%, MTX started 06/18/09  . Pulmonary fibrosis   . Community acquired pneumonia   . Chronic hypoxemic respiratory failure     4 liters oxygen  . Chronic rhinitis   . Prostate cancer     s/p external radiation and hormonal therapy  . Renal cell carcinoma   . History of enucleation of left eyeball     age 58  . Congestive heart failure     Dr. Jens Som: ECHO 07/09 EF 50%  . Coronary artery disease   . Ventricular tachyarrhythmia   . Second degree AV block   . Aortic regurgitation   . Hypertension   . Hyperlipidemia   . Anxiety   . Osteoporosis   . OSA (obstructive sleep apnea)   . Bursitis of elbow   . Gout   . Thyroid cyst   . GERD (gastroesophageal reflux disease)      Family History  Problem Relation Age of Onset  . Coronary artery disease Father   . Tuberculosis Father   . Colon cancer Mother      History   Social History  . Marital Status: Widowed    Number of Children: 3   Occupational History  . architect     retired   Social History Main Topics  . Smoking status: Never Smoker        Allergies  Allergen Reactions  . Ciprofloxacin     myalgia  . Levofloxacin     REACTION: hives  . Penicillins     rash    Review  of Systems     Objective:   Physical Exam BP 124/76  Pulse 79  Temp(Src) 98.6 F (37 C) (Oral)  Wt 156 lb 12.8 oz (71.124 kg)  SpO2 98%  General - wearing supplemental oxygen HEENT - Lt eye prosthesis, no sinus tenderness, no oral exudate, no LAN Cardiac - s1s1 no murmur Chest - coarse breath sounds with basilar crackles, no wheeze Abd - soft, nontender Ext - no edema Neuro - normal strength Skin - no rashes Psych - normal mood/behavior     Assessment & Plan:   Postinflammatory pulmonary fibrosis He had improved in his dyspnea after treatment with prednisone.  He is now on prednisone for his ear, but thinks this is helping his breathing also.  Advised him to call once his current course of prednisone is completed to determine if he may need to remain on low dose prednisone with methotrexate or increase dose of methotrexate.  Chronic respiratory failure He is to continue on 3 to 4 liters oxygen 24/7.  Updated Medication List Outpatient Encounter Prescriptions as of 05/01/2011  Medication Sig Dispense Refill  . albuterol (PROAIR HFA) 108 (90 BASE) MCG/ACT inhaler  Inhale 2 puffs into the lungs every 6 (six) hours as needed.        . ALPRAZolam (XANAX) 0.25 MG tablet Take 0.25 mg by mouth 3 (three) times daily as needed.        Marland Kitchen aspirin 81 MG tablet Take 81 mg by mouth daily.        Marland Kitchen atorvastatin (LIPITOR) 80 MG tablet Take 80 mg by mouth daily.        . Calcium Carbonate-Vitamin D (CALCIUM 600/VITAMIN D) 600-400 MG-UNIT per tablet Take 2 tablets by mouth daily.        . carvedilol (COREG) 12.5 MG tablet Take 12.5 mg by mouth 2 (two) times daily with a meal.        . Coenzyme Q10 (COQ10) 100 MG CAPS Take 1 capsule by mouth daily.        . folic acid (FOLVITE) 1 MG tablet TAKE 1 TABLET BY MOUTH EVERY DAY  30 tablet  5  . furosemide (LASIX) 40 MG tablet TAKE 4 TABLETS BY MOUTH TWICE DAILY  240 tablet  0  . guaiFENesin (MUCINEX) 600 MG 12 hr tablet Take 600 mg by mouth 2 (two)  times daily.        . isosorbide mononitrate (IMDUR) 60 MG 24 hr tablet TAKE 1 TABLET BY MOUTH DAILY  30 tablet  10  . methotrexate (RHEUMATREX) 2.5 MG tablet Take 3 tablets by mouth every Tuesday       . mometasone (NASONEX) 50 MCG/ACT nasal spray 2 sprays by Nasal route daily. 2 sprays daily as needed  17 g  6  . Multiple Vitamins-Minerals (MULTIVITAMIN WITH MINERALS) tablet Take 1 tablet by mouth daily.        . nitroGLYCERIN (NITROSTAT) 0.4 MG SL tablet Place 0.4 mg under the tongue every 5 (five) minutes as needed.        . potassium chloride SA (K-DUR,KLOR-CON) 20 MEQ tablet Take 2 tablets in AM and 1 tablet in the afternoon       . predniSONE (DELTASONE) 10 MG tablet 3 pills for 3 days, 2 pills for 3 days, 1 pill for 3 days, 1/2 pill for 3 days  20 tablet  0

## 2011-05-01 NOTE — Assessment & Plan Note (Signed)
He is to continue on 3 to 4 liters oxygen 24/7.

## 2011-05-23 ENCOUNTER — Ambulatory Visit (HOSPITAL_COMMUNITY)
Admission: RE | Admit: 2011-05-23 | Discharge: 2011-05-23 | Disposition: A | Discharge status: Home / Self Care | Payer: Medicare Other | Source: Ambulatory Visit | Attending: Emergency Medicine | Admitting: Emergency Medicine

## 2011-05-23 DIAGNOSIS — M7989 Other specified soft tissue disorders: Secondary | ICD-10-CM | POA: Insufficient documentation

## 2011-06-03 LAB — CARDIAC PANEL(CRET KIN+CKTOT+MB+TROPI)
CK, MB: 1.4
Relative Index: INVALID
Total CK: 33
Troponin I: 0.01

## 2011-06-03 LAB — BASIC METABOLIC PANEL
BUN: 27 — ABNORMAL HIGH
BUN: 28 — ABNORMAL HIGH
CO2: 28
Chloride: 101
Chloride: 102
Glucose, Bld: 110 — ABNORMAL HIGH
Glucose, Bld: 93
Potassium: 3.5
Potassium: 4.7

## 2011-06-03 LAB — CBC
HCT: 42.6
Hemoglobin: 13.7
MCHC: 34.2
MCHC: 34.3
MCV: 93.4
Platelets: 217
RBC: 4.31
RDW: 16.2 — ABNORMAL HIGH
WBC: 12.3 — ABNORMAL HIGH

## 2011-06-03 LAB — URINALYSIS, ROUTINE W REFLEX MICROSCOPIC
Glucose, UA: NEGATIVE
Ketones, ur: NEGATIVE
pH: 6

## 2011-06-03 LAB — POCT I-STAT, CHEM 8
BUN: 28 — ABNORMAL HIGH
Calcium, Ion: 1.12
Chloride: 105
Creatinine, Ser: 1.6 — ABNORMAL HIGH
Glucose, Bld: 98

## 2011-06-03 LAB — DIFFERENTIAL
Basophils Relative: 0
Lymphocytes Relative: 15
Monocytes Absolute: 0.4
Monocytes Relative: 5
Neutro Abs: 7.6

## 2011-06-03 LAB — TROPONIN I: Troponin I: 0.01

## 2011-06-12 ENCOUNTER — Encounter: Payer: Medicare Other | Admitting: *Deleted

## 2011-06-13 LAB — CULTURE, BLOOD (ROUTINE X 2): Culture: NO GROWTH

## 2011-06-13 LAB — BASIC METABOLIC PANEL
BUN: 27 — ABNORMAL HIGH
CO2: 25
Calcium: 9.4
Chloride: 106
Creatinine, Ser: 1.4
GFR calc Af Amer: 59 — ABNORMAL LOW
GFR calc non Af Amer: 49 — ABNORMAL LOW
Glucose, Bld: 115 — ABNORMAL HIGH
Potassium: 3.6
Sodium: 141
Sodium: 141

## 2011-06-13 LAB — DIFFERENTIAL
Eosinophils Absolute: 0.2
Lymphocytes Relative: 5 — ABNORMAL LOW
Lymphs Abs: 0.4 — ABNORMAL LOW
Monocytes Relative: 4
Neutrophils Relative %: 89 — ABNORMAL HIGH

## 2011-06-13 LAB — CBC
HCT: 34.9 — ABNORMAL LOW
Hemoglobin: 12.1 — ABNORMAL LOW
Hemoglobin: 12.4 — ABNORMAL LOW
MCHC: 35.8
MCV: 91.4
Platelets: 241
RBC: 3.72 — ABNORMAL LOW
RDW: 13.9
WBC: 8.5

## 2011-06-13 LAB — CARDIAC PANEL(CRET KIN+CKTOT+MB+TROPI)
Relative Index: INVALID
Relative Index: INVALID
Total CK: 46
Total CK: 54
Troponin I: 0.02
Troponin I: 0.03

## 2011-06-13 LAB — CORTISOL: Cortisol, Plasma: 27

## 2011-06-13 LAB — TROPONIN I: Troponin I: 0.05

## 2011-06-13 LAB — PHOSPHORUS: Phosphorus: 4.3

## 2011-06-13 LAB — B-NATRIURETIC PEPTIDE (CONVERTED LAB): Pro B Natriuretic peptide (BNP): 228 — ABNORMAL HIGH

## 2011-06-13 LAB — MAGNESIUM: Magnesium: 2.2

## 2011-06-16 ENCOUNTER — Encounter: Payer: Self-pay | Admitting: *Deleted

## 2011-06-20 LAB — COMPREHENSIVE METABOLIC PANEL
ALT: 19
AST: 15
Alkaline Phosphatase: 46
CO2: 28
Chloride: 104
GFR calc Af Amer: 44 — ABNORMAL LOW
GFR calc non Af Amer: 36 — ABNORMAL LOW
Potassium: 4.9
Sodium: 139
Total Bilirubin: 1.2

## 2011-06-20 LAB — APTT: aPTT: 24

## 2011-06-20 LAB — BASIC METABOLIC PANEL
BUN: 56 — ABNORMAL HIGH
CO2: 28
Calcium: 8.3 — ABNORMAL LOW
Calcium: 9.1
Calcium: 9.1
Chloride: 104
Creatinine, Ser: 1.42
Creatinine, Ser: 1.58 — ABNORMAL HIGH
GFR calc Af Amer: 58 — ABNORMAL LOW
GFR calc Af Amer: 51 — ABNORMAL LOW
GFR calc Af Amer: 52 — ABNORMAL LOW
GFR calc non Af Amer: 48 — ABNORMAL LOW
GFR calc non Af Amer: 37 — ABNORMAL LOW
GFR calc non Af Amer: 43 — ABNORMAL LOW
Glucose, Bld: 93
Glucose, Bld: 112 — ABNORMAL HIGH
Glucose, Bld: 184 — ABNORMAL HIGH
Potassium: 4.6
Potassium: 5
Potassium: 5
Sodium: 137
Sodium: 142

## 2011-06-20 LAB — CBC
HCT: 38.8 — ABNORMAL LOW
HCT: 40.1
Hemoglobin: 13.8
MCHC: 35.3
MCHC: 34.9
Platelets: 231
Platelets: 245
RBC: 4.44
RBC: 4.45
RDW: 14.3 — ABNORMAL HIGH
RDW: 14.4 — ABNORMAL HIGH
RDW: 14.8 — ABNORMAL HIGH
WBC: 13.8 — ABNORMAL HIGH
WBC: 8
WBC: 8.3

## 2011-06-20 LAB — B-NATRIURETIC PEPTIDE (CONVERTED LAB): Pro B Natriuretic peptide (BNP): 121 — ABNORMAL HIGH

## 2011-06-20 LAB — MAGNESIUM: Magnesium: 2.1

## 2011-06-23 ENCOUNTER — Encounter: Payer: Self-pay | Admitting: Internal Medicine

## 2011-06-23 ENCOUNTER — Ambulatory Visit (INDEPENDENT_AMBULATORY_CARE_PROVIDER_SITE_OTHER): Payer: Medicare Other | Admitting: *Deleted

## 2011-06-23 ENCOUNTER — Other Ambulatory Visit: Payer: Self-pay | Admitting: Internal Medicine

## 2011-06-23 DIAGNOSIS — I442 Atrioventricular block, complete: Secondary | ICD-10-CM

## 2011-06-23 LAB — COMPREHENSIVE METABOLIC PANEL
ALT: 19
Alkaline Phosphatase: 49
BUN: 29 — ABNORMAL HIGH
CO2: 26
Chloride: 104
Glucose, Bld: 147 — ABNORMAL HIGH
Potassium: 4.6
Sodium: 139
Total Bilirubin: 0.8
Total Protein: 6.5

## 2011-06-23 LAB — DIFFERENTIAL
Basophils Absolute: 0
Basophils Relative: 0
Eosinophils Absolute: 0
Monocytes Relative: 2 — ABNORMAL LOW
Neutro Abs: 8.4 — ABNORMAL HIGH
Neutrophils Relative %: 96 — ABNORMAL HIGH

## 2011-06-23 LAB — BASIC METABOLIC PANEL
BUN: 35 — ABNORMAL HIGH
BUN: 37 — ABNORMAL HIGH
CO2: 22
CO2: 29
Calcium: 8.9
Calcium: 9.2
Calcium: 9.4
Chloride: 105
Chloride: 107
Creatinine, Ser: 1.36
Creatinine, Ser: 1.48
Creatinine, Ser: 1.53 — ABNORMAL HIGH
GFR calc Af Amer: 53 — ABNORMAL LOW
GFR calc Af Amer: >60
GFR calc non Af Amer: 44 — ABNORMAL LOW
GFR calc non Af Amer: 50 — ABNORMAL LOW
Glucose, Bld: 133 — ABNORMAL HIGH
Potassium: 4.9
Sodium: 139

## 2011-06-23 LAB — CBC
HCT: 40.7
HCT: 44
Hemoglobin: 14
Hemoglobin: 15
MCHC: 34.3
MCV: 91.5
Platelets: 268
RBC: 4.44
RBC: 4.77
RDW: 14.5 — ABNORMAL HIGH
RDW: 14.7 — ABNORMAL HIGH
WBC: 10.3
WBC: 14.9 — ABNORMAL HIGH
WBC: 8.8

## 2011-06-23 LAB — URINE MICROSCOPIC-ADD ON

## 2011-06-23 LAB — B-NATRIURETIC PEPTIDE (CONVERTED LAB)
Pro B Natriuretic peptide (BNP): 455 — ABNORMAL HIGH
Pro B Natriuretic peptide (BNP): 552 — ABNORMAL HIGH

## 2011-06-23 LAB — URINALYSIS, ROUTINE W REFLEX MICROSCOPIC
Bilirubin Urine: NEGATIVE
Glucose, UA: NEGATIVE
Ketones, ur: NEGATIVE
Leukocytes, UA: NEGATIVE
Nitrite: NEGATIVE
Protein, ur: NEGATIVE
Specific Gravity, Urine: 1.019
Urobilinogen, UA: 0.2
pH: 5

## 2011-06-23 LAB — APTT: aPTT: 25

## 2011-06-24 LAB — REMOTE PACEMAKER DEVICE
AL IMPEDENCE PM: 376 Ohm
BAMS-0001: 170 {beats}/min
RV LEAD AMPLITUDE: 4.2 mv

## 2011-06-25 ENCOUNTER — Other Ambulatory Visit: Payer: Self-pay | Admitting: Cardiology

## 2011-06-25 NOTE — Progress Notes (Signed)
Pacer remote check  

## 2011-07-04 ENCOUNTER — Telehealth: Payer: Self-pay | Admitting: Family Medicine

## 2011-07-04 NOTE — Telephone Encounter (Signed)
Pt would like to switch to Dr Drue Novel due to location. Pt lives in [place] and per pt Dr Drue Novel will accept him.

## 2011-07-07 NOTE — Telephone Encounter (Signed)
That would be okay with me.

## 2011-07-08 NOTE — Telephone Encounter (Signed)
Pt is aware okay with dr fry

## 2011-07-18 ENCOUNTER — Ambulatory Visit (INDEPENDENT_AMBULATORY_CARE_PROVIDER_SITE_OTHER): Payer: Medicare Other | Admitting: Adult Health

## 2011-07-18 ENCOUNTER — Encounter: Payer: Self-pay | Admitting: Adult Health

## 2011-07-18 VITALS — BP 138/80 | HR 81 | Temp 96.5°F | Ht 65.0 in | Wt 158.0 lb

## 2011-07-18 DIAGNOSIS — J209 Acute bronchitis, unspecified: Secondary | ICD-10-CM | POA: Insufficient documentation

## 2011-07-18 MED ORDER — PREDNISONE 10 MG PO TABS: ORAL_TABLET | ORAL | Status: AC

## 2011-07-18 MED ORDER — DOXYCYCLINE HYCLATE 100 MG PO TABS: 100.0000 mg | ORAL_TABLET | Freq: Two times a day (BID) | ORAL | Status: AC

## 2011-07-18 NOTE — Progress Notes (Signed)
Reviewed and agree with plan.

## 2011-07-18 NOTE — Progress Notes (Deleted)
Subjective:    Patient ID: [name], male    DOB: September 08, 1923, 77 y.o.   MRN: [id number]  HPI    Review of Systems     Objective:   Physical Exam        Assessment & Plan:   Subjective:    Patient ID: [name], male    DOB: 1923-03-27, 77 y.o.   MRN: [id number]  HPI 77 year old male with known history of pauci-immune vasculitis with pulmonary fibrosis, hypoxemia, and sleep apnea unable to tolerate BIPAP.   01/24/11 Acute OV  Presents for work in visit. Complains of increased SOB, prod cough with yellow/green mucus, sinus congestion with yellow/green mucus x2days. Started mucinex with some help. NO recent abx or travel  . NO chest pain or edema.  Cough is getting worse today . Some sinus drainage.  >>  07/18/2011     Past Medical History   Diagnosis  Date   .  Pauci-immune vasculitis      3/02 P-ANCA>1:640, Myeloperoxidase Ab 17.6, ANA neg, RPR negative, RF<20. 7/09 ANCA<1:20, PFT 7/09 FEV1 1.90(81%), FVC 2.66 (71%), TLC 5.08 (87%), DLCO 42%, MTX started 06/18/09   .  Pulmonary fibrosis    .  Community acquired pneumonia    .  Chronic hypoxemic respiratory failure      4 liters oxygen   .  Chronic rhinitis    .  Prostate cancer      s/p external radiation and hormonal therapy   .  Renal cell carcinoma    .  History of enucleation of left eyeball      age 6   .  Congestive heart failure      Dr. Jens Som: ECHO 07/09 EF 50%   .  Coronary artery disease    .  Ventricular tachyarrhythmia    .  Second degree AV block    .  Aortic regurgitation    .  Hypertension    .  Hyperlipidemia    .  Anxiety    .  Osteoporosis    .  OSA (obstructive sleep apnea)    .  Bursitis of elbow    .  Gout    .  Thyroid cyst    .  GERD (gastroesophageal reflux disease)     Family History   Problem  Relation  Age of Onset   .  Coronary artery disease  Father    .  Tuberculosis  Father    .  Colon cancer  Mother     History    Social History   .  Marital Status:   Widowed     Spouse Name:  N/A     Number of Children:  3   .  Years of Education:  N/A    Occupational       Review of Systems Constitutional:   No  weight loss, night sweats,  Fevers, chills,   HEENT:   No headaches,  Difficulty swallowing,  Tooth/dental problems, or  Sore throat,                No sneezing, itching, ear ache, nasal congestion, post nasal drip,   CV:  No chest pain,  Orthopnea, PND, swelling in lower extremities, anasarca, dizziness, palpitations, syncope.   GI  No heartburn, indigestion, abdominal pain, nausea, vomiting, diarrhea, change in bowel habits, loss of appetite, bloody stools.   Resp: No excess mucus, no productive cough,  No non-productive cough,  No coughing  up of blood.    No wheezing.  No chest wall deformity  Skin: no rash or lesions.  GU: no dysuria, change in color of urine, no urgency or frequency.  No flank pain, no hematuria   MS:  No joint pain or swelling.  No decreased range of motion.    Psych:  No change in mood or affect. No depression or anxiety.  No memory loss.         Objective:   Physical Exam GEN: A/Ox3; pleasant , NAD, elderly chronically ill appearing.   HEENT:  /AT,  EACs-clear, TMs-wnl, NOSE-clear, THROAT-clear, no lesions, no postnasal drip or exudate noted.   NECK:  Supple w/ fair ROM; no JVD; normal carotid impulses w/o bruits; no thyromegaly or nodules palpated; no lymphadenopathy.  RESP  Coarse BS w/ few rhonchino accessory muscle use, no dullness to percussion  CARD:  RRR, no m/r/g  , no peripheral edema, pulses intact, no cyanosis or clubbing.  GI:   Soft & nt; nml bowel sounds; no organomegaly or masses detected.  Musco: Warm bil, no deformities or joint swelling noted.   Neuro: alert, no focal deficits noted.    Skin: Warm, no lesions or rashes         Assessment & Plan:

## 2011-07-18 NOTE — Patient Instructions (Signed)
Doxycycline 100mg  Twice daily  For 7 days  Mucinex DM Twice daily  As needed  Cough/congestion  Fluids and rest  Prednisone taper over next week.  Please contact office for sooner follow up if symptoms do not improve or worsen or seek emergency care   follow up Dr. Craige Cotta  In 3 weeks as planned and  As needed

## 2011-07-18 NOTE — Assessment & Plan Note (Addendum)
Acute bronchitis with underlying fibrosis and vasculitis.  Plan;  Doxycycline 100mg  Twice daily  For 7 days  Mucinex DM Twice daily  As needed  Cough/congestion  Fluids and rest  Prednisone taper over next week.  Please contact office for sooner follow up if symptoms do not improve or worsen or seek emergency care   follow up Dr. Craige Cotta  In 3 weeks as planned and  As needed

## 2011-07-18 NOTE — Progress Notes (Signed)
Subjective:    Patient ID: [name], male    DOB: 07-25-1923, 77 y.o.   MRN: [id number]  HPI 77 year old male with known history of pauci-immune vasculitis with pulmonary fibrosis, hypoxemia, and sleep apnea unable to tolerate BIPAP.   01/24/11 Acute OV  Presents for work in visit. Complains of increased SOB, prod cough with yellow/green mucus, sinus congestion with yellow/green mucus x2days. Started mucinex with some help. NO recent abx or travel  . NO chest pain or edema.  Cough is getting worse today . Some sinus drainage.  >>rx omnicef x 7 d  07/18/2011 Acute OV Pt complains of c/o incresed SOB with any exertion, productive cough with yellow mucus, chest tightness x 1 week. Using mucinex with out much help. Cough has been getting worse for the last 2 days. He is having difficulty getting up thick mucus. Shortness of breath has been more prevalent for the last 2 mornings. He denies any hemoptysis, chest pain, orthopnea, PND, or increased leg swelling. He does report he has cellulitis in his legs. one month ago and was given antibiotic therapy with complete resolution. Denies any recent travel. He remains on methotrexate regimen. Last chest x-ray was in June of this year with chronic changes only. Appetite remains good. No nausea, vomiting, or diarrhea. No recent steroid tapers.     Past Medical History   Diagnosis  Date   .  Pauci-immune vasculitis      3/02 P-ANCA>1:640, Myeloperoxidase Ab 17.6, ANA neg, RPR negative, RF<20. 7/09 ANCA<1:20, PFT 7/09 FEV1 1.90(81%), FVC 2.66 (71%), TLC 5.08 (87%), DLCO 42%, MTX started 06/18/09   .  Pulmonary fibrosis    .  Community acquired pneumonia    .  Chronic hypoxemic respiratory failure      4 liters oxygen   .  Chronic rhinitis    .  Prostate cancer      s/p external radiation and hormonal therapy   .  Renal cell carcinoma    .  History of enucleation of left eyeball      age 34   .  Congestive heart failure      Dr. Jens Som: ECHO  07/09 EF 50%   .  Coronary artery disease    .  Ventricular tachyarrhythmia    .  Second degree AV block    .  Aortic regurgitation    .  Hypertension    .  Hyperlipidemia    .  Anxiety    .  Osteoporosis    .  OSA (obstructive sleep apnea)    .  Bursitis of elbow    .  Gout    .  Thyroid cyst    .  GERD (gastroesophageal reflux disease)     Family History   Problem  Relation  Age of Onset   .  Coronary artery disease  Father    .  Tuberculosis  Father    .  Colon cancer  Mother     History    Social History   .  Marital Status:  Widowed     Spouse Name:  N/A     Number of Children:  3   .  Years of Education:  N/A    Occupational       Review of Systems Constitutional:   No  weight loss, night sweats,  Fevers, chills,   HEENT:   No headaches,  Difficulty swallowing,  Tooth/dental problems, or  Sore throat,  No sneezing, itching, ear ache, nasal congestion, post nasal drip,   CV:  No chest pain,  Orthopnea, PND, swelling in lower extremities, anasarca, dizziness, palpitations, syncope.   GI  No heartburn, indigestion, abdominal pain, nausea, vomiting, diarrhea, change in bowel habits, loss of appetite, bloody stools.   Resp: No coughing up of blood.     No chest wall deformity  Skin: no rash or lesions.  GU: no dysuria, change in color of urine, no urgency or frequency.  No flank pain, no hematuria   MS:  No joint pain or swelling.  No decreased range of motion.    Psych:  No change in mood or affect. No depression or anxiety.  No memory loss.         Objective:   Physical Exam GEN: A/Ox3; pleasant , NAD, elderly chronically ill appearing.   HEENT:  [place]/AT,  EACs-clear, TMs-wnl, NOSE-clear, THROAT-clear, no lesions, no postnasal drip or exudate noted.   NECK:  Supple w/ fair ROM; no JVD; normal carotid impulses w/o bruits; no thyromegaly or nodules palpated; no lymphadenopathy.  RESP  Coarse BS w/ bibasilar crackles, no accessory muscle  use, no dullness to percussion  CARD:  RRR, no m/r/g  , no peripheral edema, pulses intact, no cyanosis or clubbing.  GI:   Soft & nt; nml bowel sounds; no organomegaly or masses detected.  Musco: Warm bil, no deformities or joint swelling noted.   Neuro: alert, no focal deficits noted.    Skin: Warm, no lesions or rashes         Assessment & Plan:

## 2011-07-26 ENCOUNTER — Encounter: Payer: Self-pay | Admitting: Cardiology

## 2011-07-28 ENCOUNTER — Telehealth: Payer: Self-pay | Admitting: Cardiology

## 2011-07-29 ENCOUNTER — Encounter: Payer: Self-pay | Admitting: Cardiology

## 2011-07-29 ENCOUNTER — Telehealth: Payer: Self-pay | Admitting: *Deleted

## 2011-07-29 ENCOUNTER — Ambulatory Visit (INDEPENDENT_AMBULATORY_CARE_PROVIDER_SITE_OTHER): Payer: Medicare Other | Admitting: Cardiology

## 2011-07-29 DIAGNOSIS — I509 Heart failure, unspecified: Secondary | ICD-10-CM

## 2011-07-29 DIAGNOSIS — E876 Hypokalemia: Secondary | ICD-10-CM

## 2011-07-29 DIAGNOSIS — R0989 Other specified symptoms and signs involving the circulatory and respiratory systems: Secondary | ICD-10-CM

## 2011-07-29 DIAGNOSIS — R0600 Dyspnea, unspecified: Secondary | ICD-10-CM

## 2011-07-29 LAB — BRAIN NATRIURETIC PEPTIDE: Pro B Natriuretic peptide (BNP): 71 pg/mL (ref 0.0–100.0)

## 2011-07-29 LAB — BASIC METABOLIC PANEL
Chloride: 100 mEq/L (ref 96–112)
Creatinine, Ser: 1.4 mg/dL (ref 0.4–1.5)

## 2011-07-29 MED ORDER — FUROSEMIDE 40 MG PO TABS: 80.0000 mg | ORAL_TABLET | Freq: Two times a day (BID) | ORAL | Status: DC

## 2011-07-29 MED ORDER — POTASSIUM CHLORIDE CRYS ER 20 MEQ PO TBCR: 40.0000 meq | EXTENDED_RELEASE_TABLET | Freq: Two times a day (BID) | ORAL | Status: DC

## 2011-07-29 MED ORDER — NITROGLYCERIN 0.4 MG SL SUBL: 0.4000 mg | SUBLINGUAL_TABLET | SUBLINGUAL | Status: AC | PRN

## 2011-07-29 NOTE — Telephone Encounter (Signed)
Spoke with DOD/ Dr Graciela Husbands, pt to increase K+ to BID x two days, then stay at 40 meq BID, app to have bmet 08/01/11. MAR changed to reflect new total dose.

## 2011-07-29 NOTE — Assessment & Plan Note (Signed)
Continue statin. 

## 2011-07-29 NOTE — Assessment & Plan Note (Signed)
Continue present medications. 

## 2011-07-29 NOTE — Patient Instructions (Signed)
Dr. Jens Som will see you back in January at your previously scheduled appointment.  Your physician recommends that you have lab work today for a bmp, bnp  Your physician has recommended you make the following change in your medication:  Decrease Lasix (furosemide) You may take an extra 80 mg of lasix daily as needed

## 2011-07-29 NOTE — Assessment & Plan Note (Signed)
Blood pressure controlled. Continue present medications. 

## 2011-07-29 NOTE — Assessment & Plan Note (Signed)
Patient does not appear to be volume overloaded on examination. His dyspnea could also be explained by his pulmonary status. Check potassium, renal function and BNP. Decrease Lasix to 80 mg p.o. B.i.d. Take an additional 80 mg daily as needed.

## 2011-07-29 NOTE — Assessment & Plan Note (Signed)
Followed by electrophysiology. 

## 2011-07-29 NOTE — Progress Notes (Signed)
HPI:[name] is a pleasant gentleman who has a history of coronary artery disease status post coronary bypass and graft, ischemic cardiomyopathy, combined systolic/diastolic heart failure, and history of pacemaker placement.  He also has pulmonary fibrosis and vasculitis.  His most recent Myoview was performed in May of 2011.  At that time, the ejection fraction was 56%.  There was a prior inferior and apical infarct but no ischemia. An echocardiogram performed on March 13, 2008 showed an ejection fraction of 50%. There was mild aortic insufficiency. I last saw him in Feb 2012. Since then he continues to have dyspnea on exertion relieved with rest.  He was seen at urgent care and felt to possibly have congestive heart failure. His Lasix was increased to 160 mg p.o. B.i.d. His symptoms are unchanged. He denies orthopnea, PND, pedal edema or chest pain. Note he was also recently given steroids and antibiotics for his pulmonary disease. Current Outpatient Prescriptions  Medication Sig Dispense Refill  . albuterol (PROAIR HFA) 108 (90 BASE) MCG/ACT inhaler Inhale 2 puffs into the lungs every 6 (six) hours as needed.        . ALPRAZolam (XANAX) 0.25 MG tablet Take 0.25 mg by mouth 3 (three) times daily as needed.        Marland Kitchen aspirin 81 MG tablet Take 81 mg by mouth daily.        Marland Kitchen atorvastatin (LIPITOR) 80 MG tablet Take 80 mg by mouth daily.        . Calcium Carbonate-Vitamin D (CALCIUM 600/VITAMIN D) 600-400 MG-UNIT per tablet Take 2 tablets by mouth daily.        . carvedilol (COREG) 12.5 MG tablet Take 12.5 mg by mouth 2 (two) times daily with a meal.        . Coenzyme Q10 (COQ10) 100 MG CAPS Take 1 capsule by mouth daily.        . folic acid (FOLVITE) 1 MG tablet TAKE 1 TABLET BY MOUTH EVERY DAY  30 tablet  5  . furosemide (LASIX) 40 MG tablet TAKE 4 TABLETS BY MOUTH TWICE DAILY  240 tablet  0  . guaiFENesin (MUCINEX) 600 MG 12 hr tablet Take 600 mg by mouth 2 (two) times daily.        . isosorbide  mononitrate (IMDUR) 60 MG 24 hr tablet TAKE 1 TABLET BY MOUTH DAILY  30 tablet  10  . methotrexate (RHEUMATREX) 2.5 MG tablet Take 3 tablets by mouth every Tuesday       . mometasone (NASONEX) 50 MCG/ACT nasal spray 2 sprays by Nasal route daily. 2 sprays daily as needed  17 g  6  . Multiple Vitamins-Minerals (MULTIVITAMIN WITH MINERALS) tablet Take 1 tablet by mouth daily.        . nitroGLYCERIN (NITROSTAT) 0.4 MG SL tablet Place 0.4 mg under the tongue every 5 (five) minutes as needed.        . potassium chloride SA (K-DUR,KLOR-CON) 20 MEQ tablet Take 2 tablets in AM and 1 tablet in the afternoon       . doxycycline (VIBRA-TABS) 100 MG tablet Take 1 tablet (100 mg total) by mouth 2 (two) times daily.  14 tablet  0  . predniSONE (DELTASONE) 10 MG tablet 4 tabs for 2 days, then 3 tabs for 2 days, 2 tabs for 2 days, then 1 tab for 2 days, then stop  20 tablet  0     Past Medical History  Diagnosis Date  . Pauci-immune vasculitis  3/02 P-ANCA>1:640, Myeloperoxidase Ab 17.6, ANA neg, RPR negative, RF<20. 7/09 ANCA<1:20, PFT 7/09 FEV1 1.90(81%), FVC 2.66 (71%), TLC 5.08 (87%), DLCO 42%, MTX started 06/18/09  . Pulmonary fibrosis   . Community acquired pneumonia   . Chronic hypoxemic respiratory failure     4 liters oxygen  . Chronic rhinitis   . Prostate cancer     s/p external radiation and hormonal therapy  . Renal cell carcinoma   . History of enucleation of left eyeball     age 38  . Congestive heart failure     Dr. Jens Som: ECHO 07/09 EF 50%  . Coronary artery disease   . Ventricular tachyarrhythmia   . Second degree AV block   . Aortic regurgitation   . Hypertension   . Hyperlipidemia   . Anxiety   . Osteoporosis   . OSA (obstructive sleep apnea)   . Bursitis of elbow   . Gout   . Thyroid cyst   . GERD (gastroesophageal reflux disease)     Past Surgical History  Procedure Date  . Coronary artery bypass graft 2/01    Dr. Sheliah Plane  . Nephrectomy 2002    left  partial  . Transurethral resection of prostate   . Septoplasty   . Tympanoplasty   . Tympanic membrane repair     implant  . Pacemaker insertion     12/07  . Kyphosis surgery 4.09    Dr. Colon Branch    History   Social History  . Marital Status: Widowed    Spouse Name: N/A    Number of Children: 3  . Years of Education: N/A   Occupational History  . architect     retired   Social History Main Topics  . Smoking status: Never Smoker   . Smokeless tobacco: Never Used  . Alcohol Use: No  . Drug Use: Not on file  . Sexually Active: Not on file   Other Topics Concern  . Not on file   Social History Narrative  . No narrative on file    ROS: no fevers or chills, productive cough, hemoptysis, dysphasia, odynophagia, melena, hematochezia, dysuria, hematuria, rash, seizure activity, orthopnea, PND, pedal edema, claudication. Remaining systems are negative.  Physical Exam: Well-developed chronically ill in no acute distress.  Skin is warm and dry.  Neck is supple. No thyromegaly.  Chest is diminished breath sounds  Cardiovascular exam is regular rate and rhythm.  Abdominal exam nontender or distended. No masses palpated. Extremities show no edema. neuro grossly intact  ECG atrial pacing, right bundle branch block, left anterior fascicular block, left ventricular hypertrophy, prior septal infarct.

## 2011-08-01 ENCOUNTER — Other Ambulatory Visit: Payer: Medicare Other | Admitting: *Deleted

## 2011-08-04 ENCOUNTER — Encounter: Payer: Self-pay | Admitting: Cardiology

## 2011-08-05 ENCOUNTER — Encounter: Payer: Self-pay | Admitting: Cardiology

## 2011-08-07 ENCOUNTER — Other Ambulatory Visit (INDEPENDENT_AMBULATORY_CARE_PROVIDER_SITE_OTHER): Payer: Medicare Other

## 2011-08-07 ENCOUNTER — Ambulatory Visit (INDEPENDENT_AMBULATORY_CARE_PROVIDER_SITE_OTHER): Payer: Medicare Other | Admitting: Pulmonary Disease

## 2011-08-07 ENCOUNTER — Encounter: Payer: Self-pay | Admitting: Pulmonary Disease

## 2011-08-07 VITALS — BP 118/84 | HR 80 | Temp 97.8°F | Ht 65.0 in | Wt 155.4 lb

## 2011-08-07 DIAGNOSIS — J9610 Chronic respiratory failure, unspecified whether with hypoxia or hypercapnia: Secondary | ICD-10-CM

## 2011-08-07 DIAGNOSIS — E876 Hypokalemia: Secondary | ICD-10-CM

## 2011-08-07 DIAGNOSIS — J8410 Pulmonary fibrosis, unspecified: Secondary | ICD-10-CM

## 2011-08-07 LAB — BASIC METABOLIC PANEL
CO2: 29 mEq/L (ref 19–32)
Calcium: 9.4 mg/dL (ref 8.4–10.5)
GFR: 45.01 mL/min — ABNORMAL LOW (ref >60.00)
Sodium: 142 mEq/L (ref 135–145)

## 2011-08-07 LAB — CBC WITH DIFFERENTIAL/PLATELET
Basophils Absolute: 0 10*3/uL (ref 0.0–0.1)
Eosinophils Absolute: 0 10*3/uL (ref 0.0–0.7)
HCT: 38.7 % — ABNORMAL LOW (ref 39.0–52.0)
Hemoglobin: 13.1 g/dL (ref 13.0–17.0)
Lymphs Abs: 0.3 10*3/uL — ABNORMAL LOW (ref 0.7–4.0)
MCHC: 33.8 g/dL (ref 30.0–36.0)
MCV: 96.3 fl (ref 78.0–100.0)
Monocytes Absolute: 0.1 10*3/uL (ref 0.1–1.0)
Neutro Abs: 8.2 10*3/uL — ABNORMAL HIGH (ref 1.4–7.7)
RDW: 15 % — ABNORMAL HIGH (ref 11.5–14.6)

## 2011-08-07 LAB — HEPATIC FUNCTION PANEL
Alkaline Phosphatase: 79 U/L (ref 39–117)
Bilirubin, Direct: 0.1 mg/dL (ref 0.0–0.3)
Total Protein: 7.5 g/dL (ref 6.0–8.3)

## 2011-08-07 NOTE — Progress Notes (Signed)
Chief Complaint  Patient presents with  . Follow-up    Pt states he has his good and bad days w/ his breathing. Pt states he has occas chest tightness.    History of Present Illness: [name] is a 77 y.o. male known history of pauci-immune vasculitis with pulmonary fibrosis, hypoxemia, and sleep apnea unable to tolerate BIPAP.  He was treated for a bronchitis earlier this month.  He has since improved.  He still has occasional cough and chest congestion, but not much sputum.  His activity level is about the same.  He has not had much leg swelling.  He was started on prednisone for gout.  He denies fever, or hemoptysis.  Past Medical History  Diagnosis Date  . Pauci-immune vasculitis     3/02 P-ANCA>1:640, Myeloperoxidase Ab 17.6, ANA neg, RPR negative, RF<20. 7/09 ANCA<1:20, PFT 7/09 FEV1 1.90(81%), FVC 2.66 (71%), TLC 5.08 (87%), DLCO 42%, MTX started 06/18/09  . Pulmonary fibrosis   . Community acquired pneumonia   . Chronic hypoxemic respiratory failure     4 liters oxygen  . Chronic rhinitis   . Prostate cancer     s/p external radiation and hormonal therapy  . Renal cell carcinoma   . History of enucleation of left eyeball     age 54  . Congestive heart failure     Dr. Jens Som: ECHO 07/09 EF 50%  . Coronary artery disease   . Ventricular tachyarrhythmia   . Second degree AV block   . Aortic regurgitation   . Hypertension   . Hyperlipidemia   . Anxiety   . Osteoporosis   . OSA (obstructive sleep apnea)   . Bursitis of elbow   . Gout   . Thyroid cyst   . GERD (gastroesophageal reflux disease)     Past Surgical History  Procedure Date  . Coronary artery bypass graft 2/01    Dr. Sheliah Plane  . Nephrectomy 2002    left partial  . Transurethral resection of prostate   . Septoplasty   . Tympanoplasty   . Tympanic membrane repair     implant  . Pacemaker insertion     12/07  . Kyphosis surgery 4.09    Dr. Colon Branch    Current Outpatient  Prescriptions on File Prior to Visit  Medication Sig Dispense Refill  . albuterol (PROAIR HFA) 108 (90 BASE) MCG/ACT inhaler Inhale 2 puffs into the lungs every 6 (six) hours as needed.        . ALPRAZolam (XANAX) 0.25 MG tablet Take 0.25 mg by mouth 3 (three) times daily as needed.        Marland Kitchen aspirin 81 MG tablet Take 81 mg by mouth daily.        Marland Kitchen atorvastatin (LIPITOR) 80 MG tablet Take 80 mg by mouth daily.        . Calcium Carbonate-Vitamin D (CALCIUM 600/VITAMIN D) 600-400 MG-UNIT per tablet Take 2 tablets by mouth daily.        . carvedilol (COREG) 12.5 MG tablet Take 12.5 mg by mouth 2 (two) times daily with a meal.        . Coenzyme Q10 (COQ10) 100 MG CAPS Take 1 capsule by mouth daily.        . folic acid (FOLVITE) 1 MG tablet TAKE 1 TABLET BY MOUTH EVERY DAY  30 tablet  5  . furosemide (LASIX) 40 MG tablet Take 2 tablets (80 mg total) by mouth 2 (two) times daily. May take  an extra 80mg  as needed   240 tablet  0  . guaiFENesin (MUCINEX) 600 MG 12 hr tablet Take 600 mg by mouth 2 (two) times daily.        . methotrexate (RHEUMATREX) 2.5 MG tablet Take 3 tablets by mouth every Tuesday       . mometasone (NASONEX) 50 MCG/ACT nasal spray 2 sprays by Nasal route daily. 2 sprays daily as needed  17 g  6  . Multiple Vitamins-Minerals (MULTIVITAMIN WITH MINERALS) tablet Take 1 tablet by mouth daily.        . nitroGLYCERIN (NITROSTAT) 0.4 MG SL tablet Place 1 tablet (0.4 mg total) under the tongue every 5 (five) minutes as needed.  25 tablet  11  . potassium chloride SA (K-DUR,KLOR-CON) 20 MEQ tablet Take 2 tablets (40 mEq total) by mouth 2 (two) times daily.  120 tablet  5  . isosorbide mononitrate (IMDUR) 60 MG 24 hr tablet TAKE 1 TABLET BY MOUTH DAILY  30 tablet  10    Allergies  Allergen Reactions  . Ciprofloxacin     myalgia  . Levofloxacin     REACTION: hives  . Penicillins     rash    Physical Exam:  Blood pressure 118/84, pulse 80, temperature 97.8 F (36.6 C), temperature  source Oral, height 5\' 5"  (1.651 m), weight 155 lb 6.4 oz (70.489 kg), SpO2 98.00%.  General - wearing supplemental oxygen  HEENT - Lt eye prosthesis, no sinus tenderness, no oral exudate, no LAN  Cardiac - s1s1 no murmur  Chest - coarse breath sounds with basilar crackles, no wheeze  Abd - soft, nontender  Ext - no edema  Neuro - normal strength  Skin - no rashes  Psych - normal mood/behavior  CMP     Component Value Date/Time   NA 140 07/29/2011 1027   K 3.1* 07/29/2011 1027   CL 100 07/29/2011 1027   CO2 29 07/29/2011 1027   GLUCOSE 94 07/29/2011 1027   BUN 24* 07/29/2011 1027   CREATININE 1.4 07/29/2011 1027   CALCIUM 8.8 07/29/2011 1027   PROT 6.7 03/06/2011 1506   ALBUMIN 4.0 03/06/2011 1506   AST 18 03/06/2011 1506   ALT 15 03/06/2011 1506   ALKPHOS 78 03/06/2011 1506   BILITOT 0.9 03/06/2011 1506   GFRNONAA 44.81 05/06/2010 1217   GFRAA  Value: 42        The eGFR has been calculated using the MDRD equation. This calculation has not been validated in all clinical situations. eGFR's persistently <60 mL/min signify possible Chronic Kidney Disease.* 01/04/2010 0137    Lab Results  Component Value Date   WBC 6.6 03/06/2011   HGB 12.6* 03/06/2011   HCT 36.3* 03/06/2011   MCV 95.1 03/06/2011   PLT 255.0 03/06/2011   Lab Results  Component Value Date   ESRSEDRATE 32* 03/06/2011     Assessment/Plan:  Postinflammatory pulmonary fibrosis Clinically stable.  Will check his LFT and CBC, and call him with results to discuss plan for MTX.  Chronic respiratory failure He is to continue on 3 to 4 liters oxygen 24/7.           Outpatient Encounter Prescriptions as of 08/07/2011  Medication Sig Dispense Refill  . albuterol (PROAIR HFA) 108 (90 BASE) MCG/ACT inhaler Inhale 2 puffs into the lungs every 6 (six) hours as needed.        . ALPRAZolam (XANAX) 0.25 MG tablet Take 0.25 mg by mouth 3 (three) times daily as needed.        Marland Kitchen  aspirin 81 MG tablet Take 81 mg by mouth  daily.        Marland Kitchen atorvastatin (LIPITOR) 80 MG tablet Take 80 mg by mouth daily.        . Calcium Carbonate-Vitamin D (CALCIUM 600/VITAMIN D) 600-400 MG-UNIT per tablet Take 2 tablets by mouth daily.        . carvedilol (COREG) 12.5 MG tablet Take 12.5 mg by mouth 2 (two) times daily with a meal.        . Coenzyme Q10 (COQ10) 100 MG CAPS Take 1 capsule by mouth daily.        . folic acid (FOLVITE) 1 MG tablet TAKE 1 TABLET BY MOUTH EVERY DAY  30 tablet  5  . furosemide (LASIX) 40 MG tablet Take 2 tablets (80 mg total) by mouth 2 (two) times daily. May take an extra 80mg  as needed   240 tablet  0  . guaiFENesin (MUCINEX) 600 MG 12 hr tablet Take 600 mg by mouth 2 (two) times daily.        . methotrexate (RHEUMATREX) 2.5 MG tablet Take 3 tablets by mouth every Tuesday       . mometasone (NASONEX) 50 MCG/ACT nasal spray 2 sprays by Nasal route daily. 2 sprays daily as needed  17 g  6  . Multiple Vitamins-Minerals (MULTIVITAMIN WITH MINERALS) tablet Take 1 tablet by mouth daily.        . nitroGLYCERIN (NITROSTAT) 0.4 MG SL tablet Place 1 tablet (0.4 mg total) under the tongue every 5 (five) minutes as needed.  25 tablet  11  . potassium chloride SA (K-DUR,KLOR-CON) 20 MEQ tablet Take 2 tablets (40 mEq total) by mouth 2 (two) times daily.  120 tablet  5  . predniSONE (DELTASONE) 10 MG tablet Taper as directed      . isosorbide mononitrate (IMDUR) 60 MG 24 hr tablet TAKE 1 TABLET BY MOUTH DAILY  30 tablet  10    Maryruth Apple Pager:  [phone] 08/07/2011, 12:15 PM

## 2011-08-07 NOTE — Assessment & Plan Note (Signed)
Clinically stable.  Will check his LFT and CBC, and call him with results to discuss plan for MTX.

## 2011-08-07 NOTE — Assessment & Plan Note (Signed)
He is to continue on 3 to 4 liters oxygen 24/7.   

## 2011-08-07 NOTE — Patient Instructions (Signed)
Lab tests today>>will call with results Follow up in 4 months

## 2011-08-12 ENCOUNTER — Ambulatory Visit (INDEPENDENT_AMBULATORY_CARE_PROVIDER_SITE_OTHER): Payer: Medicare Other | Admitting: Internal Medicine

## 2011-08-12 ENCOUNTER — Telehealth: Payer: Self-pay | Admitting: Pulmonary Disease

## 2011-08-12 ENCOUNTER — Encounter: Payer: Self-pay | Admitting: Internal Medicine

## 2011-08-12 VITALS — BP 102/60 | HR 83 | Temp 97.4°F | Wt 153.8 lb

## 2011-08-12 DIAGNOSIS — Z23 Encounter for immunization: Secondary | ICD-10-CM

## 2011-08-12 DIAGNOSIS — J9610 Chronic respiratory failure, unspecified whether with hypoxia or hypercapnia: Secondary | ICD-10-CM

## 2011-08-12 DIAGNOSIS — I5042 Chronic combined systolic (congestive) and diastolic (congestive) heart failure: Secondary | ICD-10-CM

## 2011-08-12 DIAGNOSIS — J8410 Pulmonary fibrosis, unspecified: Secondary | ICD-10-CM

## 2011-08-12 MED ORDER — METHOTREXATE 2.5 MG PO TABS: 7.5000 mg | ORAL_TABLET | ORAL | Status: DC

## 2011-08-12 NOTE — Assessment & Plan Note (Signed)
F/u closely by pulmonary

## 2011-08-12 NOTE — Progress Notes (Signed)
Subjective:    Patient ID: [name], male    DOB: 09/04/1923, 77 y.o.   MRN: [id number]  HPI Here with his daughter [name], to get establish In general doing well, no major concerns. His past medical history and past surgical history are reviewed  Past Medical History  Diagnosis Date  . Pauci-immune vasculitis     3/02 P-ANCA>1:640, Myeloperoxidase Ab 17.6, ANA neg, RPR negative, RF<20. 7/09 ANCA<1:20, PFT 7/09 FEV1 1.90(81%), FVC 2.66 (71%), TLC 5.08 (87%), DLCO 42%, MTX started 06/18/09  . Pulmonary fibrosis   . Community acquired pneumonia   . Chronic hypoxemic respiratory failure     4 liters oxygen  . Chronic rhinitis   . Prostate cancer     s/p external radiation and hormonal therapy  . Renal cell carcinoma   . History of enucleation of left eyeball     age 48  . Congestive heart failure     Dr. Jens Som: ECHO 07/09 EF 50%  . Coronary artery disease   . Ventricular tachyarrhythmia   . Second degree AV block   . Aortic regurgitation   . Hypertension   . Hyperlipidemia   . Anxiety   . Osteoporosis   . OSA (obstructive sleep apnea)   . Bursitis of elbow   . Gout   . Thyroid cyst   . GERD (gastroesophageal reflux disease)    Past Surgical History  Procedure Date  . Coronary artery bypass graft 2/01    Dr. Sheliah Plane  . Nephrectomy 2002    left partial  . Transurethral resection of prostate   . Septoplasty   . Tympanoplasty   . Tympanic membrane repair     implant  . Pacemaker insertion     12/07  . Kyphosis surgery 4.09    Dr. Colon Branch   History   Social History  . Marital Status: Widowed    Spouse Name: N/A    Number of Children: 3  . Years of Education: N/A   Occupational History  . 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 Smoking status: Never Smoker   . Smokeless tobacco: Never Used  . Alcohol Use: Yes     rarely has a glass of wine   . Drug Use: No  . Sexually Active: Not on file   Other Topics  Concern  . Not on file   Social History Narrative   Lives by himself, 2 sons, 1 daughter [name] (lives close by).---Drives sometimes     Review of Systems We went over his medicines, good medication compliance. He lives by himself, still drives rarely, he gets a lot of help from his daughter and son, both live in town. He has mild lower extremity edema for which he took some Lasix lately, edema is better (worse on the right leg, which is usual for him since he had the CABG)     Objective:   Physical Exam  Constitutional: He is oriented to person, place, and time.  Cardiovascular:       Seems regular, trace systolic murmur  Pulmonary/Chest:       Decreased breath sounds, few rhonchi. No wheezing  Musculoskeletal:       Mild edema at  the right leg  Neurological: He is alert and oriented to person, place, and time.  Psychiatric: He has a normal mood and affect. His behavior is normal.          Assessment & Plan:  New patient , previously sees by Dr Clent Ridges , this office is closer. He has a number of medical problems and sees different specialist, fortunately is clinically stable. Plan to give him a flu shot today RTC 6 months and prn

## 2011-08-12 NOTE — Assessment & Plan Note (Signed)
F/u by cards

## 2011-08-12 NOTE — Telephone Encounter (Signed)
CMP     Component Value Date/Time   NA 142 08/07/2011 1226   K 4.5 08/07/2011 1226   CL 101 08/07/2011 1226   CO2 29 08/07/2011 1226   GLUCOSE 108* 08/07/2011 1226   BUN 30* 08/07/2011 1226   CREATININE 1.6* 08/07/2011 1226   CALCIUM 9.4 08/07/2011 1226   PROT 7.5 08/07/2011 1226   ALBUMIN 4.0 08/07/2011 1226   AST 19 08/07/2011 1226   ALT 19 08/07/2011 1226   ALKPHOS 79 08/07/2011 1226   BILITOT 0.6 08/07/2011 1226   GFRNONAA 44.81 05/06/2010 1217   GFRAA  Value: 42        The eGFR has been calculated using the MDRD equation. This calculation has not been validated in all clinical situations. eGFR's persistently <60 mL/min signify possible Chronic Kidney Disease.* 01/04/2010 0137    Lab Results  Component Value Date   WBC 8.6 08/07/2011   HGB 13.1 08/07/2011   HCT 38.7* 08/07/2011   MCV 96.3 08/07/2011   PLT 408.0* 08/07/2011   Left message detailing results and that refill for methotrexate will be sent to his pharmacy.

## 2011-08-20 ENCOUNTER — Ambulatory Visit (INDEPENDENT_AMBULATORY_CARE_PROVIDER_SITE_OTHER): Payer: Medicare Other | Admitting: Internal Medicine

## 2011-08-20 VITALS — BP 118/56 | HR 78 | Temp 98.4°F | Ht 65.0 in

## 2011-08-20 DIAGNOSIS — M109 Gout, unspecified: Secondary | ICD-10-CM

## 2011-08-20 MED ORDER — PREDNISONE 10 MG PO TABS: ORAL_TABLET | ORAL | Status: DC

## 2011-08-20 NOTE — Progress Notes (Signed)
  Subjective:    Patient ID: [name], male    DOB: Jul 06, 1923, 77 y.o.   MRN: [id number]  HPI The patient suspected gout episode, developed pain redness and  swelling of the left foot. Pain was initially on the right foot as well but it didn't swell up.  Past Medical History: PULMONARY Pauci-immune vasculitis with pulmonary fibrosis      - Dr. Coralyn Helling      - 03/02 P-ANCA > 1:640, Myeloperoxidase Ab 17.6, ANA negative, RPR negative, RF < 20      - 07/09 ANCA < 1:20      - PFT 07/09 FEV1 1.90 (81%), FVC 2.66 (71%), TLC 5.08 (87%), DLCO 42%      - MTX started 06/18/09 Chronic hypoxemic respiratory failure      - 4 liters oxygen  Obstructive sleep apnea      - PSG 01/08 AHI 11      - unable to tolerate BPAP due to ear problems  CARDIAC: Chronic systolic congestive heart failure                   - Dr. Olga Millers                    - Echo 07/09 EF 50% Coronary artery disease Ventricular Tachycardia Second degree AV block, Mobitz Type II Mild Aortic regurgitation OTHER: Prostate cancer s/p external radiation and hormonal therapy      - Dr. Jethro Bolus Renal cell carcinoma Left eye tumor s/p enucleation age 27 Hypertension Hyperlipidemia Anxiety Osteoporosis      - T12, L1, L2 compression fracture Gout Thyroid cyst GERD Chronic rhinitis   Past Surgical History: Reviewed history from 12/18/2008 and no changes required. CABG 02/01      - Dr. Sheliah Plane Left partial nephrectomy 04/02      - Dr. Jethro Bolus TURP Septoplasty Right tympanoplasty Left tympanic implant Pacemaker insertion 12/07 Kyphoplasty 04/09     - Dr. Colon Branch   Review of Systems He was taking prednisone at 22 a few days ago. Denies any fever, chills?. Denies any feet injury     Objective:   Physical Exam  Constitutional: He appears well-developed.  Musculoskeletal:       Feet:       No peripheral edema, right foot normal, see graphic            Assessment & Plan:

## 2011-08-20 NOTE — Patient Instructions (Signed)
Elevate the foot, ice, rest Take prednisone as follows: 4 tabs a day x 2 days, then 3 tabs a day x 2 days, 2 tabs a day x 2 days, 1 tab a day x 2 days, then stop Call if not improving soon or if problem resurface You will need a different medicine if you have more gout episodes See the diet information

## 2011-08-20 NOTE — Assessment & Plan Note (Signed)
Sx c/w gout, pt and son report similar episodes before. Creatinine is elevated thus will avoid NSAIDs Plan:  Prednisone, see instructions Labs

## 2011-09-08 ENCOUNTER — Telehealth: Payer: Self-pay | Admitting: Pulmonary Disease

## 2011-09-08 ENCOUNTER — Ambulatory Visit (INDEPENDENT_AMBULATORY_CARE_PROVIDER_SITE_OTHER): Payer: Medicare Other | Admitting: Internal Medicine

## 2011-09-08 ENCOUNTER — Encounter: Payer: Self-pay | Admitting: Internal Medicine

## 2011-09-08 VITALS — BP 112/72 | HR 80 | Temp 97.9°F | Wt 158.2 lb

## 2011-09-08 DIAGNOSIS — J9610 Chronic respiratory failure, unspecified whether with hypoxia or hypercapnia: Secondary | ICD-10-CM

## 2011-09-08 MED ORDER — DOXYCYCLINE HYCLATE 100 MG PO TABS: 100.0000 mg | ORAL_TABLET | Freq: Two times a day (BID) | ORAL | Status: DC

## 2011-09-08 MED ORDER — PREDNISONE 5 MG PO TABS: ORAL_TABLET | ORAL | Status: DC

## 2011-09-08 NOTE — Telephone Encounter (Signed)
Called spoke with patient's daughter Jari Pigg who stated that patient is having a "hard time breathing" x6days, with DOE, chest congestion, difficulty breathing and low grade temp over the weekend.  Daughter stated that she has not heard pt wheezing but pt did mention that "it feels weird when he breaths" and she felt that this may have been some wheezing.  VS not in the office, will forward to doc of the day for recs.  Dr Sherene Sires please advise, thanks.  Allergies  Allergen Reactions  . Ciprofloxacin     myalgia  . Levofloxacin     REACTION: hives  . Penicillins     rash   WG at [address] and [address].

## 2011-09-08 NOTE — Telephone Encounter (Signed)
I spoke with daughter and is aware of MW recs. She voiced her understanding and will talk with her father and see what he wants to do.

## 2011-09-08 NOTE — Telephone Encounter (Signed)
Can't treat this over the phone, needs to be seen here or UC or if worse go to ER

## 2011-09-08 NOTE — Patient Instructions (Signed)
Continue all meds and oxygen Doxycycline x 1 week Restart prednisone, see prescription ER if you get worse Watch your R leg, call if swelling worse or you develop pain

## 2011-09-08 NOTE — Progress Notes (Signed)
  Subjective:    Patient ID: [name], male    DOB: 01-08-1923, 77 y.o.   MRN: [id number]  HPI Acute visit. Not feeling well for the last week, increased cough and sputum production, more wheezy than usual, dyspnea on exertion more than baseline. He is currently off prednisone but the daughter reports that he was breathing better while on it.  Past Medical History:  PULMONARY  Pauci-immune vasculitis with pulmonary fibrosis  - Dr. Coralyn Helling  - 03/02 P-ANCA > 1:640, Myeloperoxidase Ab 17.6, ANA negative, RPR negative, RF < 20  - 07/09 ANCA < 1:20  - PFT 07/09 FEV1 1.90 (81%), FVC 2.66 (71%), TLC 5.08 (87%), DLCO 42%  - MTX started 06/18/09  Chronic hypoxemic respiratory failure  - 4 liters oxygen  Obstructive sleep apnea  - PSG 01/08 AHI 11  - unable to tolerate BPAP due to ear problems  CARDIAC:  Chronic systolic congestive heart failure  - Dr. Olga Millers  - Echo 07/09 EF 50%  Coronary artery disease  Ventricular Tachycardia  Second degree AV block, Mobitz Type II  Mild Aortic regurgitation  OTHER:  Prostate cancer s/p external radiation and hormonal therapy  - Dr. Jethro Bolus  Renal cell carcinoma  Left eye tumor s/p enucleation age 68  Hypertension  Hyperlipidemia  Anxiety  Osteoporosis  - T12, L1, L2 compression fracture  Gout  Thyroid cyst  GERD  Chronic rhinitis   Past Surgical History:  Reviewed history from 12/18/2008 and no changes required.  CABG 02/01  - Dr. Sheliah Plane  Left partial nephrectomy 04/02  - Dr. Jethro Bolus  TURP  Septoplasty  Right tympanoplasty  Left tympanic implant  Pacemaker insertion 12/07  Kyphoplasty 04/09  - Dr. Colon Branch    Review of Systems Apparently had a elevated temperature 2 days ago but no further fevers. Occasional chills Mild nausea, decreased appetite but no vomiting or diarrhea. I noted his  right leg to be swollen, according to the daughter is on and off for finding.      Objective:   Physical Exam  Constitutional: He appears well-developed.       On oxygen, on no acute distress  Cardiovascular: Normal rate, regular rhythm and normal heart sounds.   Pulmonary/Chest:       No increased work of breathing, he has dry crackles at the bases (half way up), mild end expiratory wheezing.  Musculoskeletal:       He has edema at the right leg, calf is not tender. Left leg is free of edema.      Assessment & Plan:  Today , I spent more than 25 min with the patient, >50% of the time counseling the family about pan of care and  reviewing the chart

## 2011-09-08 NOTE — Assessment & Plan Note (Addendum)
Seen acutely today with increased respiratory symptoms from baseline, reportedly had fever 2 days ago. Interestingly he also finished steroids last week. Respiratory physical exam is at baseline ( I compare my exam with previous entries) He probably has a respiratory infection in the setting of severe lung disease. It is also possible that he has become prednisone dependent and symptoms are a reflection of the lack of prednisone. I'm somehow concerned about the right leg swelling however the daughter reports that that is a chronic, on and off finding Plan: Doxycycline Low dose prednisone Recommended to see his pulmonary doctor next week. See instructions

## 2011-09-09 ENCOUNTER — Encounter: Payer: Self-pay | Admitting: Internal Medicine

## 2011-09-11 ENCOUNTER — Other Ambulatory Visit: Payer: Self-pay | Admitting: Cardiology

## 2011-09-15 ENCOUNTER — Encounter: Payer: Self-pay | Admitting: Adult Health

## 2011-09-15 ENCOUNTER — Ambulatory Visit (INDEPENDENT_AMBULATORY_CARE_PROVIDER_SITE_OTHER): Payer: Medicare Other | Admitting: Adult Health

## 2011-09-15 VITALS — BP 104/64 | HR 82 | Temp 96.7°F | Ht 65.0 in | Wt 153.2 lb

## 2011-09-15 DIAGNOSIS — J8410 Pulmonary fibrosis, unspecified: Secondary | ICD-10-CM

## 2011-09-15 DIAGNOSIS — J209 Acute bronchitis, unspecified: Secondary | ICD-10-CM

## 2011-09-15 MED ORDER — METHYLPREDNISOLONE ACETATE 80 MG/ML IJ SUSP
80.0000 mg | Freq: Once | INTRAMUSCULAR | Status: AC
  Administered 2011-09-15: 80 mg via INTRAMUSCULAR

## 2011-09-15 MED ORDER — LEVALBUTEROL HCL 0.63 MG/3ML IN NEBU
0.6300 mg | INHALATION_SOLUTION | Freq: Once | RESPIRATORY_TRACT | Status: AC
  Administered 2011-09-15: 0.63 mg via RESPIRATORY_TRACT

## 2011-09-15 MED ORDER — PREDNISONE 10 MG PO TABS: ORAL_TABLET | ORAL | Status: DC

## 2011-09-15 MED ORDER — CEFDINIR 300 MG PO CAPS: 300.0000 mg | ORAL_CAPSULE | Freq: Two times a day (BID) | ORAL | Status: AC

## 2011-09-15 NOTE — Progress Notes (Signed)
Subjective:    Patient ID: [name], male    DOB: Aug 14, 1923, 78 y.o.   MRN: [id number]  HPI 78 year old male with known history of pauci-immune vasculitis with pulmonary fibrosis, hypoxemia, and sleep apnea unable to tolerate BIPAP.   01/24/11 Acute OV  Presents for work in visit. Complains of increased SOB, prod cough with yellow/green mucus, sinus congestion with yellow/green mucus x2days. Started mucinex with some help. NO recent abx or travel  . NO chest pain or edema.  Cough is getting worse today . Some sinus drainage.  >>rx omnicef x 7 d  07/18/2011 Acute OV Pt complains of c/o incresed SOB with any exertion, productive cough with yellow mucus, chest tightness x 1 week. Using mucinex with out much help. Cough has been getting worse for the last 2 days. He is having difficulty getting up thick mucus. Shortness of breath has been more prevalent for the last 2 mornings. He denies any hemoptysis, chest pain, orthopnea, PND, or increased leg swelling. He does report he has cellulitis in his legs. one month ago and was given antibiotic therapy with complete resolution. Denies any recent travel. He remains on methotrexate regimen. Last chest x-ray was in June of this year with chronic changes only. Appetite remains good. No nausea, vomiting, or diarrhea. No recent steroid tapers. >>Doxycycline and steroid burst  09/15/2011 Acute OV  Complains of prod cough with light yellow mucus, increased SOB, occ wheezing and tightness in chest x2weeks. Mostly dry in nature. Seen by PCP 12.31.12 and was given doxycycline and pred taper. Has couple days of prednisone left.  Does have some nasal drainage. No fever. No hemoptysis.  ABX did not help much. No swelling. Appetite is okay, no n/v/d.     ROS:  Constitutional:   No  weight loss, night sweats,  Fevers, chills,  +fatigue, or  lassitude.  HEENT:   No headaches,  Difficulty swallowing,  Tooth/dental problems, or  Sore throat,                No  sneezing, itching, ear ache,  +nasal congestion, post nasal drip,   CV:  No chest pain,  Orthopnea, PND, swelling in lower extremities, anasarca, dizziness, palpitations, syncope.   GI  No heartburn, indigestion, abdominal pain, nausea, vomiting, diarrhea, change in bowel habits, loss of appetite, bloody stools.   Resp:    No coughing up of blood.   No chest wall deformity  Skin: no rash or lesions.  GU: no dysuria, change in color of urine, no urgency or frequency.  No flank pain, no hematuria   MS:  No joint pain or swelling.  No decreased range of motion.     Psych:  No change in mood or affect. No depression or anxiety.  No memory loss.        Past Medical History   Diagnosis  Date   .  Pauci-immune vasculitis      3/02 P-ANCA>1:640, Myeloperoxidase Ab 17.6, ANA neg, RPR negative, RF<20. 7/09 ANCA<1:20, PFT 7/09 FEV1 1.90(81%), FVC 2.66 (71%), TLC 5.08 (87%), DLCO 42%, MTX started 06/18/09   .  Pulmonary fibrosis    .  Community acquired pneumonia    .  Chronic hypoxemic respiratory failure      4 liters oxygen   .  Chronic rhinitis    .  Prostate cancer      s/p external radiation and hormonal therapy   .  Renal cell carcinoma    .  History of enucleation of left eyeball      age 54   .  Congestive heart failure      Dr. Jens Som: ECHO 07/09 EF 50%   .  Coronary artery disease    .  Ventricular tachyarrhythmia    .  Second degree AV block    .  Aortic regurgitation    .  Hypertension    .  Hyperlipidemia    .  Anxiety    .  Osteoporosis    .  OSA (obstructive sleep apnea)    .  Bursitis of elbow    .  Gout    .  Thyroid cyst    .  GERD (gastroesophageal reflux disease)     Family History   Problem  Relation  Age of Onset   .  Coronary artery disease  Father    .  Tuberculosis  Father    .  Colon cancer  Mother     History    Social History   .  Marital Status:  Widowed     Spouse Name:  N/A     Number of Children:  3   .  Years of Education:  N/A      Occupational                Objective:   Physical Exam GEN: A/Ox3; pleasant , NAD, elderly chronically ill appearing.   HEENT:  [place]/AT,  EACs-clear, TMs-wnl, NOSE-clear, THROAT-clear, no lesions, no postnasal drip or exudate noted.   NECK:  Supple w/ fair ROM; no JVD; normal carotid impulses w/o bruits; no thyromegaly or nodules palpated; no lymphadenopathy.  RESP  Coarse BS w/ bibasilar crackles, no accessory muscle use, no dullness to percussion  CARD:  RRR, no m/r/g  , no peripheral edema, pulses intact, no cyanosis or clubbing.  GI:   Soft & nt; nml bowel sounds; no organomegaly or masses detected.  Musco: Warm bil, no deformities or joint swelling noted.   Neuro: alert, no focal deficits noted.    Skin: Warm, no lesions or rashes         Assessment & Plan:

## 2011-09-15 NOTE — Patient Instructions (Addendum)
Omnicef 300mg  Twice daily  For 10 days  Mucinex DM Twice daily  As needed  Cough/congestion  Prednisone taper over next week.  Fluids and rest  follow up Dr. Craige Cotta  In 4 weeks and As needed   Please contact office for sooner follow up if symptoms do not improve or worsen or seek emergency care

## 2011-09-18 ENCOUNTER — Other Ambulatory Visit: Payer: Self-pay | Admitting: Cardiology

## 2011-09-18 ENCOUNTER — Encounter: Payer: Self-pay | Admitting: Cardiology

## 2011-09-18 ENCOUNTER — Ambulatory Visit (INDEPENDENT_AMBULATORY_CARE_PROVIDER_SITE_OTHER): Payer: Medicare Other | Admitting: Cardiology

## 2011-09-18 VITALS — BP 122/76 | HR 77 | Ht 64.0 in | Wt 153.0 lb

## 2011-09-18 DIAGNOSIS — I2510 Atherosclerotic heart disease of native coronary artery without angina pectoris: Secondary | ICD-10-CM

## 2011-09-18 DIAGNOSIS — I5042 Chronic combined systolic (congestive) and diastolic (congestive) heart failure: Secondary | ICD-10-CM

## 2011-09-18 DIAGNOSIS — E785 Hyperlipidemia, unspecified: Secondary | ICD-10-CM

## 2011-09-18 DIAGNOSIS — Z950 Presence of cardiac pacemaker: Secondary | ICD-10-CM

## 2011-09-18 DIAGNOSIS — I2589 Other forms of chronic ischemic heart disease: Secondary | ICD-10-CM

## 2011-09-18 DIAGNOSIS — J209 Acute bronchitis, unspecified: Secondary | ICD-10-CM | POA: Insufficient documentation

## 2011-09-18 DIAGNOSIS — I255 Ischemic cardiomyopathy: Secondary | ICD-10-CM

## 2011-09-18 DIAGNOSIS — I10 Essential (primary) hypertension: Secondary | ICD-10-CM

## 2011-09-18 MED ORDER — FUROSEMIDE 40 MG PO TABS: ORAL_TABLET | ORAL | Status: DC

## 2011-09-18 NOTE — Patient Instructions (Signed)
Your physician wants you to follow-up in: 3 months. You will receive a reminder letter in the mail two months in advance. If you don't receive a letter, please call our office to schedule the follow-up appointment.   

## 2011-09-18 NOTE — Assessment & Plan Note (Signed)
Continue aspirin and statin. Conservative measures given multiple medical issues including severe pulmonary disease.

## 2011-09-18 NOTE — Assessment & Plan Note (Signed)
Blood pressure controlled. Continue present medications. 

## 2011-09-18 NOTE — Assessment & Plan Note (Signed)
Management per electrophysiology. 

## 2011-09-18 NOTE — Assessment & Plan Note (Signed)
Euvolemic on examination. Recent BNP normal. Continue present dose of Lasix.

## 2011-09-18 NOTE — Progress Notes (Signed)
HPI: [name] is a pleasant gentleman who has a history of coronary artery disease status post coronary bypass and graft, ischemic cardiomyopathy, combined systolic/diastolic heart failure, and history of pacemaker placement. He also has pulmonary fibrosis and vasculitis. His most recent Myoview was performed in May of 2011. At that time, the ejection fraction was 56%. There was a prior inferior and apical infarct but no ischemia. An echocardiogram performed on March 13, 2008 showed an ejection fraction of 50%. There was mild aortic insufficiency. I last saw him in Nov 2012. His BNP was normal and we decreased his lasix. Since then he continues to have significant dyspnea on exertion. He is on prednisone per pulmonary. There is no orthopnea, PND, increased pedal edema. He occasionally feels tight in his chest when he is congested but otherwise no chest pain.  Current Outpatient Prescriptions  Medication Sig Dispense Refill  . albuterol (PROAIR HFA) 108 (90 BASE) MCG/ACT inhaler Inhale 2 puffs into the lungs every 6 (six) hours as needed.        . ALPRAZolam (XANAX) 0.25 MG tablet Take 0.25 mg by mouth 3 (three) times daily as needed.        Marland Kitchen aspirin 81 MG tablet Take 81 mg by mouth daily.        Marland Kitchen atorvastatin (LIPITOR) 80 MG tablet Take 80 mg by mouth daily.        . Calcium Carbonate-Vitamin D (CALCIUM 600/VITAMIN D) 600-400 MG-UNIT per tablet Take 2 tablets by mouth daily.        . carvedilol (COREG) 12.5 MG tablet Take 12.5 mg by mouth 2 (two) times daily with a meal.        . cefdinir (OMNICEF) 300 MG capsule Take 1 capsule (300 mg total) by mouth 2 (two) times daily.  20 capsule  0  . Coenzyme Q10 (COQ10) 100 MG CAPS Take 1 capsule by mouth daily.        . folic acid (FOLVITE) 1 MG tablet TAKE 1 TABLET BY MOUTH EVERY DAY  30 tablet  5  . furosemide (LASIX) 40 MG tablet Take 2 tablets (80 mg total) by mouth 2 (two) times daily. May take an extra 80mg  as needed  270 tablet  3  . guaiFENesin  (MUCINEX) 600 MG 12 hr tablet Take 600 mg by mouth 2 (two) times daily.        . isosorbide mononitrate (IMDUR) 60 MG 24 hr tablet TAKE 1 TABLET BY MOUTH DAILY  30 tablet  10  . methotrexate (RHEUMATREX) 2.5 MG tablet Take 3 tablets (7.5 mg total) by mouth once a week. Take 3 tablets by mouth every Tuesday  15 tablet  3  . mometasone (NASONEX) 50 MCG/ACT nasal spray 2 sprays by Nasal route daily. 2 sprays daily as needed  17 g  6  . Multiple Vitamins-Minerals (MULTIVITAMIN WITH MINERALS) tablet Take 1 tablet by mouth daily.        . nitroGLYCERIN (NITROSTAT) 0.4 MG SL tablet Place 1 tablet (0.4 mg total) under the tongue every 5 (five) minutes as needed.  25 tablet  11  . potassium chloride SA (K-DUR,KLOR-CON) 20 MEQ tablet Take 2 tablets (40 mEq total) by mouth 2 (two) times daily.  120 tablet  5  . predniSONE (DELTASONE) 10 MG tablet 4 tabs for 2 days, then 3 tabs for 2 days, 2 tabs for 2 days, then 1 tab for 2 days, then stop  20 tablet  0  . triamcinolone ointment (KENALOG)  0.1 % Apply 1 application topically 2 (two) times daily.       Marland Kitchen DISCONTD: furosemide (LASIX) 40 MG tablet Take 2 tablets (80 mg total) by mouth 2 (two) times daily. May take an extra 80mg  as needed   240 tablet  0  . DISCONTD: furosemide (LASIX) 40 MG tablet Take 2 tablets (80 mg total) by mouth 2 (two) times daily. May take an extra 80mg  as needed    240 tablet  0     Past Medical History  Diagnosis Date  . Pauci-immune vasculitis     3/02 P-ANCA>1:640, Myeloperoxidase Ab 17.6, ANA neg, RPR negative, RF<20. 7/09 ANCA<1:20, PFT 7/09 FEV1 1.90(81%), FVC 2.66 (71%), TLC 5.08 (87%), DLCO 42%, MTX started 06/18/09  . Pulmonary fibrosis   . Community acquired pneumonia   . Chronic hypoxemic respiratory failure     4 liters oxygen  . Chronic rhinitis   . Prostate cancer     s/p external radiation and hormonal therapy  . Renal cell carcinoma   . History of enucleation of left eyeball     age 7  . Congestive heart  failure     Dr. Jens Som: ECHO 07/09 EF 50%  . Coronary artery disease   . Ventricular tachyarrhythmia   . Second degree AV block   . Aortic regurgitation   . Hypertension   . Hyperlipidemia   . Anxiety   . Osteoporosis   . OSA (obstructive sleep apnea)   . Bursitis of elbow   . Gout   . Thyroid cyst   . GERD (gastroesophageal reflux disease)     Past Surgical History  Procedure Date  . Coronary artery bypass graft 2/01    Dr. Sheliah Plane  . Nephrectomy 2002    left partial  . Transurethral resection of prostate   . Septoplasty   . Tympanoplasty   . Tympanic membrane repair     implant  . Pacemaker insertion     12/07  . Kyphosis surgery 4.09    Dr. Colon Branch    History   Social History  . Marital Status: Widowed    Spouse Name: N/A    Number of Children: 3  . Years of Education: N/A   Occupational History  . 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 Smoking status: Never Smoker   . Smokeless tobacco: Never Used  . Alcohol Use: Yes     rarely has a glass of wine   . Drug Use: No  . Sexually Active: Not on file   Other Topics Concern  . Not on file   Social History Narrative   Lives by himself, 2 sons, 1 daughter Jari Pigg (lives close by).---Drives sometimes     ROS: no fevers or chills, productive cough, hemoptysis, dysphasia, odynophagia, melena, hematochezia, dysuria, hematuria, rash, seizure activity, orthopnea, PND, pedal edema, claudication. Remaining systems are negative.  Physical Exam: Well-developed well-nourished in no acute distress.  Skin is warm and dry.  HEENT is normal.  Neck is supple. No thyromegaly.  Chest is basilar crackles Cardiovascular exam is regular rate and rhythm.  Abdominal exam nontender or distended. No masses palpated. Extremities show trace edema. neuro grossly intact  ECG atrial paced rhythm at a rate of 77. Left anterior fascicular block. Right bundle branch block. Left  ventricular hypertrophy.

## 2011-09-18 NOTE — Assessment & Plan Note (Signed)
Slow to resolve   Plan;  Omnicef 300mg  Twice daily  For 10 days  Mucinex DM Twice daily  As needed  Cough/congestion  Prednisone taper over next week.  Fluids and rest  follow up Dr. Craige Cotta  In 4 weeks and As needed   Please contact office for sooner follow up if symptoms do not improve or worsen or seek emergency care

## 2011-09-18 NOTE — Assessment & Plan Note (Signed)
Continue statin. 

## 2011-09-18 NOTE — Progress Notes (Signed)
Reviewed, and agree with plan.

## 2011-09-25 ENCOUNTER — Ambulatory Visit (INDEPENDENT_AMBULATORY_CARE_PROVIDER_SITE_OTHER): Payer: Medicare Other | Admitting: *Deleted

## 2011-09-25 ENCOUNTER — Encounter: Payer: Self-pay | Admitting: Internal Medicine

## 2011-09-25 DIAGNOSIS — I442 Atrioventricular block, complete: Secondary | ICD-10-CM

## 2011-09-28 ENCOUNTER — Inpatient Hospital Stay (HOSPITAL_COMMUNITY)
Admission: EM | Admit: 2011-09-28 | Discharge: 2011-10-07 | DRG: 189 | Disposition: A | Discharge status: Hospice | Payer: Medicare Other | Attending: Internal Medicine | Admitting: Internal Medicine

## 2011-09-28 ENCOUNTER — Emergency Department (HOSPITAL_COMMUNITY): Payer: Medicare Other

## 2011-09-28 DIAGNOSIS — J84112 Idiopathic pulmonary fibrosis: Secondary | ICD-10-CM | POA: Diagnosis present

## 2011-09-28 DIAGNOSIS — Z8546 Personal history of malignant neoplasm of prostate: Secondary | ICD-10-CM

## 2011-09-28 DIAGNOSIS — S32009A Unspecified fracture of unspecified lumbar vertebra, initial encounter for closed fracture: Secondary | ICD-10-CM | POA: Insufficient documentation

## 2011-09-28 DIAGNOSIS — C649 Malignant neoplasm of unspecified kidney, except renal pelvis: Secondary | ICD-10-CM | POA: Insufficient documentation

## 2011-09-28 DIAGNOSIS — J209 Acute bronchitis, unspecified: Secondary | ICD-10-CM

## 2011-09-28 DIAGNOSIS — J8410 Pulmonary fibrosis, unspecified: Secondary | ICD-10-CM

## 2011-09-28 DIAGNOSIS — I2510 Atherosclerotic heart disease of native coronary artery without angina pectoris: Secondary | ICD-10-CM | POA: Diagnosis present

## 2011-09-28 DIAGNOSIS — M25559 Pain in unspecified hip: Secondary | ICD-10-CM

## 2011-09-28 DIAGNOSIS — I5042 Chronic combined systolic (congestive) and diastolic (congestive) heart failure: Secondary | ICD-10-CM

## 2011-09-28 DIAGNOSIS — R0902 Hypoxemia: Secondary | ICD-10-CM | POA: Diagnosis present

## 2011-09-28 DIAGNOSIS — M2550 Pain in unspecified joint: Secondary | ICD-10-CM

## 2011-09-28 DIAGNOSIS — I255 Ischemic cardiomyopathy: Secondary | ICD-10-CM | POA: Insufficient documentation

## 2011-09-28 DIAGNOSIS — I288 Other diseases of pulmonary vessels: Secondary | ICD-10-CM | POA: Diagnosis present

## 2011-09-28 DIAGNOSIS — Z66 Do not resuscitate: Secondary | ICD-10-CM | POA: Diagnosis present

## 2011-09-28 DIAGNOSIS — M109 Gout, unspecified: Secondary | ICD-10-CM

## 2011-09-28 DIAGNOSIS — E041 Nontoxic single thyroid nodule: Secondary | ICD-10-CM

## 2011-09-28 DIAGNOSIS — Z950 Presence of cardiac pacemaker: Secondary | ICD-10-CM | POA: Insufficient documentation

## 2011-09-28 DIAGNOSIS — R0989 Other specified symptoms and signs involving the circulatory and respiratory systems: Secondary | ICD-10-CM

## 2011-09-28 DIAGNOSIS — J9610 Chronic respiratory failure, unspecified whether with hypoxia or hypercapnia: Secondary | ICD-10-CM

## 2011-09-28 DIAGNOSIS — K219 Gastro-esophageal reflux disease without esophagitis: Secondary | ICD-10-CM

## 2011-09-28 DIAGNOSIS — I359 Nonrheumatic aortic valve disorder, unspecified: Secondary | ICD-10-CM | POA: Insufficient documentation

## 2011-09-28 DIAGNOSIS — J9620 Acute and chronic respiratory failure, unspecified whether with hypoxia or hypercapnia: Secondary | ICD-10-CM

## 2011-09-28 DIAGNOSIS — I509 Heart failure, unspecified: Secondary | ICD-10-CM | POA: Diagnosis present

## 2011-09-28 DIAGNOSIS — Z951 Presence of aortocoronary bypass graft: Secondary | ICD-10-CM

## 2011-09-28 DIAGNOSIS — I10 Essential (primary) hypertension: Secondary | ICD-10-CM | POA: Diagnosis present

## 2011-09-28 DIAGNOSIS — L509 Urticaria, unspecified: Secondary | ICD-10-CM

## 2011-09-28 DIAGNOSIS — M810 Age-related osteoporosis without current pathological fracture: Secondary | ICD-10-CM

## 2011-09-28 DIAGNOSIS — E785 Hyperlipidemia, unspecified: Secondary | ICD-10-CM

## 2011-09-28 DIAGNOSIS — I6529 Occlusion and stenosis of unspecified carotid artery: Secondary | ICD-10-CM

## 2011-09-28 DIAGNOSIS — R609 Edema, unspecified: Secondary | ICD-10-CM

## 2011-09-28 DIAGNOSIS — R627 Adult failure to thrive: Secondary | ICD-10-CM | POA: Diagnosis present

## 2011-09-28 DIAGNOSIS — I442 Atrioventricular block, complete: Secondary | ICD-10-CM

## 2011-09-28 DIAGNOSIS — F411 Generalized anxiety disorder: Secondary | ICD-10-CM

## 2011-09-28 DIAGNOSIS — R0600 Dyspnea, unspecified: Secondary | ICD-10-CM

## 2011-09-28 DIAGNOSIS — G4733 Obstructive sleep apnea (adult) (pediatric): Secondary | ICD-10-CM

## 2011-09-28 LAB — CBC
HCT: 40 % (ref 39.0–52.0)
HCT: 39.6 % (ref 39.0–52.0)
Hemoglobin: 13.7 g/dL (ref 13.0–17.0)
MCH: 31 pg (ref 26.0–34.0)
MCHC: 34.3 g/dL (ref 30.0–36.0)
MCV: 90.6 fL (ref 78.0–100.0)
RBC: 4.42 MIL/uL (ref 4.22–5.81)
RBC: 4.37 MIL/uL (ref 4.22–5.81)
RDW: 14.7 % (ref 11.5–15.5)
WBC: 8.1 10*3/uL (ref 4.0–10.5)

## 2011-09-28 LAB — REMOTE PACEMAKER DEVICE
AL IMPEDENCE PM: 392 Ohm
ATRIAL PACING PM: 72.33
BATTERY VOLTAGE: 2.99 V
VENTRICULAR PACING PM: 0.15

## 2011-09-28 LAB — LEGIONELLA ANTIGEN, URINE: Legionella Antigen, Urine: NEGATIVE

## 2011-09-28 LAB — BASIC METABOLIC PANEL
BUN: 19 mg/dL (ref 6–23)
Chloride: 97 mEq/L (ref 96–112)
Creatinine, Ser: 1.34 mg/dL (ref 0.50–1.35)
Glucose, Bld: 84 mg/dL (ref 70–99)
Potassium: 3.3 mEq/L — ABNORMAL LOW (ref 3.5–5.1)

## 2011-09-28 LAB — APTT: aPTT: 30 seconds (ref 24–37)

## 2011-09-28 LAB — COMPREHENSIVE METABOLIC PANEL
BUN: 17 mg/dL (ref 6–23)
CO2: 27 mEq/L (ref 19–32)
Calcium: 9.1 mg/dL (ref 8.4–10.5)
Creatinine, Ser: 1.22 mg/dL (ref 0.50–1.35)
GFR calc Af Amer: 60 mL/min — ABNORMAL LOW (ref >90)
GFR calc non Af Amer: 52 mL/min — ABNORMAL LOW (ref >90)
Glucose, Bld: 101 mg/dL — ABNORMAL HIGH (ref 70–99)
Sodium: 134 mEq/L — ABNORMAL LOW (ref 135–145)
Total Protein: 6.9 g/dL (ref 6.0–8.3)

## 2011-09-28 LAB — DIFFERENTIAL
Basophils Relative: 0 % (ref 0–1)
Eosinophils Absolute: 0.5 10*3/uL (ref 0.0–0.7)
Lymphs Abs: 0.5 10*3/uL — ABNORMAL LOW (ref 0.7–4.0)
Monocytes Absolute: 1.2 10*3/uL — ABNORMAL HIGH (ref 0.1–1.0)
Monocytes Relative: 13 % — ABNORMAL HIGH (ref 3–12)
Neutro Abs: 6.7 10*3/uL (ref 1.7–7.7)
Neutrophils Relative %: 75 % (ref 43–77)

## 2011-09-28 LAB — PHOSPHORUS: Phosphorus: 3.5 mg/dL (ref 2.3–4.6)

## 2011-09-28 LAB — STREP PNEUMONIAE URINARY ANTIGEN: Strep Pneumo Urinary Antigen: NEGATIVE

## 2011-09-28 LAB — POCT I-STAT TROPONIN I: Troponin i, poc: 0 ng/mL (ref 0.00–0.08)

## 2011-09-28 LAB — PROTIME-INR: INR: 1.1 (ref 0.00–1.49)

## 2011-09-28 LAB — MAGNESIUM: Magnesium: 2.3 mg/dL (ref 1.5–2.5)

## 2011-09-28 LAB — LACTIC ACID, PLASMA: Lactic Acid, Venous: 1.5 mmol/L (ref 0.5–2.2)

## 2011-09-28 LAB — CORTISOL: Cortisol, Plasma: 11.6 ug/dL

## 2011-09-28 MED ORDER — MULTI-VITAMIN/MINERALS PO TABS: 1.0000 | ORAL_TABLET | Freq: Every day | ORAL | Status: DC

## 2011-09-28 MED ORDER — METHOTREXATE 2.5 MG PO TABS: 7.5000 mg | ORAL_TABLET | ORAL | Status: DC

## 2011-09-28 MED ORDER — CALCIUM CARBONATE-VITAMIN D 500-200 MG-UNIT PO TABS
2.0000 | ORAL_TABLET | Freq: Every day | ORAL | Status: DC
  Administered 2011-09-29 – 2011-10-03 (×5): 2 via ORAL
  Filled 2011-09-28 (×6): qty 2

## 2011-09-28 MED ORDER — VANCOMYCIN HCL 1000 MG IV SOLR
750.0000 mg | Freq: Two times a day (BID) | INTRAVENOUS | Status: DC
  Administered 2011-09-28 – 2011-09-30 (×4): 750 mg via INTRAVENOUS
  Filled 2011-09-28 (×6): qty 750

## 2011-09-28 MED ORDER — ADULT MULTIVITAMIN W/MINERALS CH
1.0000 | ORAL_TABLET | Freq: Every day | ORAL | Status: DC
  Administered 2011-09-29 – 2011-10-03 (×5): 1 via ORAL
  Filled 2011-09-28 (×5): qty 1

## 2011-09-28 MED ORDER — FOLIC ACID 1 MG PO TABS
1.0000 mg | ORAL_TABLET | Freq: Every day | ORAL | Status: DC
  Administered 2011-09-29 – 2011-10-03 (×5): 1 mg via ORAL
  Filled 2011-09-28 (×5): qty 1

## 2011-09-28 MED ORDER — ALPRAZOLAM 0.25 MG PO TABS
0.2500 mg | ORAL_TABLET | Freq: Three times a day (TID) | ORAL | Status: DC | PRN
  Administered 2011-10-02 – 2011-10-07 (×6): 0.25 mg via ORAL
  Filled 2011-09-28 (×6): qty 1

## 2011-09-28 MED ORDER — PANTOPRAZOLE SODIUM 40 MG PO TBEC
40.0000 mg | DELAYED_RELEASE_TABLET | Freq: Every day | ORAL | Status: DC
  Administered 2011-09-28 – 2011-09-29 (×2): 40 mg via ORAL
  Filled 2011-09-28 (×2): qty 1

## 2011-09-28 MED ORDER — ASPIRIN 81 MG PO CHEW
81.0000 mg | CHEWABLE_TABLET | Freq: Every day | ORAL | Status: DC
  Administered 2011-09-29 – 2011-10-07 (×9): 81 mg via ORAL
  Filled 2011-09-28 (×9): qty 1

## 2011-09-28 MED ORDER — SODIUM CHLORIDE 0.9 % IV SOLN
Freq: Once | INTRAVENOUS | Status: AC
  Administered 2011-09-28: 14:00:00 via INTRAVENOUS

## 2011-09-28 MED ORDER — POTASSIUM CHLORIDE CRYS ER 20 MEQ PO TBCR
40.0000 meq | EXTENDED_RELEASE_TABLET | Freq: Two times a day (BID) | ORAL | Status: DC
  Administered 2011-09-28 – 2011-09-29 (×2): 40 meq via ORAL
  Filled 2011-09-28 (×3): qty 2

## 2011-09-28 MED ORDER — ASPIRIN 81 MG PO CHEW
324.0000 mg | CHEWABLE_TABLET | ORAL | Status: AC
Start: 2011-09-28 — End: 2011-09-28
  Administered 2011-09-28: 324 mg via ORAL
  Filled 2011-09-28: qty 4

## 2011-09-28 MED ORDER — SODIUM CHLORIDE 0.9 % IV SOLN: INTRAVENOUS | Status: DC

## 2011-09-28 MED ORDER — FUROSEMIDE 80 MG PO TABS
80.0000 mg | ORAL_TABLET | Freq: Two times a day (BID) | ORAL | Status: DC
  Administered 2011-09-28 – 2011-09-29 (×2): 80 mg via ORAL
  Filled 2011-09-28 (×3): qty 1

## 2011-09-28 MED ORDER — ALBUTEROL SULFATE HFA 108 (90 BASE) MCG/ACT IN AERS: 2.0000 | INHALATION_SPRAY | Freq: Four times a day (QID) | RESPIRATORY_TRACT | Status: DC | PRN

## 2011-09-28 MED ORDER — SODIUM CHLORIDE 0.9 % IV SOLN: 250.0000 mL | INTRAVENOUS | Status: DC | PRN

## 2011-09-28 MED ORDER — CALCIUM CARBONATE-VITAMIN D 600-400 MG-UNIT PO TABS
2.0000 | ORAL_TABLET | Freq: Every day | ORAL | Status: DC
  Filled 2011-09-28: qty 2

## 2011-09-28 MED ORDER — HEPARIN SODIUM (PORCINE) 5000 UNIT/ML IJ SOLN
5000.0000 [IU] | Freq: Three times a day (TID) | INTRAMUSCULAR | Status: DC
  Administered 2011-09-28 – 2011-10-06 (×23): 5000 [IU] via SUBCUTANEOUS
  Filled 2011-09-28 (×28): qty 1

## 2011-09-28 MED ORDER — NITROGLYCERIN 0.4 MG SL SUBL: 0.4000 mg | SUBLINGUAL_TABLET | SUBLINGUAL | Status: DC | PRN

## 2011-09-28 MED ORDER — ASPIRIN 300 MG RE SUPP
300.0000 mg | RECTAL | Status: AC
Start: 2011-09-28 — End: 2011-09-28

## 2011-09-28 MED ORDER — TRIAMCINOLONE ACETONIDE 0.1 % EX OINT
1.0000 "application " | TOPICAL_OINTMENT | Freq: Two times a day (BID) | CUTANEOUS | Status: DC
  Administered 2011-09-28 – 2011-10-06 (×12): 1 via TOPICAL
  Filled 2011-09-28 (×2): qty 15

## 2011-09-28 MED ORDER — GUAIFENESIN ER 600 MG PO TB12
600.0000 mg | ORAL_TABLET | Freq: Two times a day (BID) | ORAL | Status: DC
  Administered 2011-09-28 – 2011-10-07 (×18): 600 mg via ORAL
  Filled 2011-09-28 (×19): qty 1

## 2011-09-28 MED ORDER — COQ10 100 MG PO CAPS: 1.0000 | ORAL_CAPSULE | Freq: Every day | ORAL | Status: DC

## 2011-09-28 MED ORDER — DEXTROSE 5 % IV SOLN
1.0000 g | Freq: Three times a day (TID) | INTRAVENOUS | Status: DC
  Administered 2011-09-28 – 2011-09-29 (×3): 1 g via INTRAVENOUS
  Filled 2011-09-28 (×6): qty 1

## 2011-09-28 MED ORDER — ISOSORBIDE MONONITRATE 15 MG HALF TABLET
15.0000 mg | ORAL_TABLET | Freq: Every day | ORAL | Status: DC
  Administered 2011-09-28 – 2011-10-07 (×10): 15 mg via ORAL
  Filled 2011-09-28 (×10): qty 1

## 2011-09-28 MED ORDER — ASPIRIN 81 MG PO TABS
81.0000 mg | ORAL_TABLET | Freq: Every day | ORAL | Status: DC
Start: 2011-09-28 — End: 2011-09-28

## 2011-09-28 MED ORDER — CARVEDILOL 12.5 MG PO TABS
12.5000 mg | ORAL_TABLET | Freq: Two times a day (BID) | ORAL | Status: DC
  Administered 2011-09-28 – 2011-09-29 (×2): 12.5 mg via ORAL
  Filled 2011-09-28 (×3): qty 1

## 2011-09-28 MED ORDER — IOHEXOL 300 MG/ML  SOLN
100.0000 mL | Freq: Once | INTRAMUSCULAR | Status: AC | PRN
  Administered 2011-09-28: 100 mL via INTRAVENOUS

## 2011-09-28 NOTE — Consult Note (Addendum)
. Name: [name] MRN: [id number] DOB: 11-28-1922    LOS: 0  PCCM ADMISSION NOTE  History of Present Illness: 78 year old male with known history of pauci-immune vasculitis with pulmonary fibrosis, hypoxemia, and sleep apnea unable to tolerate BIPAP.  He has been tx x 2 since 09/02/11 for bronchitis with abx and steroids. Every time he comes off steroids he develops increased hypoxia refractory to home O2 at 5 l/m. + for yellow sinus drainage on a daily basis. He presents to ED with sats on 76% on 5 l/m with activity.  He is a DNR per his request. PCCM asked to evaluate.   Subjective:  Pleasant 78 yo WM who is in no acute distress. Awake and articulate. REluctant to get admitted  Past Medical History  Diagnosis Date  . Pauci-immune vasculitis     3/02 P-ANCA>1:640, Myeloperoxidase Ab 17.6, ANA neg, RPR negative, RF<20. 7/09 ANCA<1:20, PFT 7/09 FEV1 1.90(81%), FVC 2.66 (71%), TLC 5.08 (87%), DLCO 42%, MTX started 06/18/09  . Pulmonary fibrosis   . Community acquired pneumonia   . Chronic hypoxemic respiratory failure     4 liters oxygen  . Chronic rhinitis   . Prostate cancer     s/p external radiation and hormonal therapy  . Renal cell carcinoma   . History of enucleation of left eyeball     age 51  . Congestive heart failure     Dr. Jens Som: ECHO 07/09 EF 50%  . Coronary artery disease   . Ventricular tachyarrhythmia   . Second degree AV block   . Aortic regurgitation   . Hypertension   . Hyperlipidemia   . Anxiety   . Osteoporosis   . OSA (obstructive sleep apnea)   . Bursitis of elbow   . Gout   . Thyroid cyst   . GERD (gastroesophageal reflux disease)    Past Surgical History  Procedure Date  . Coronary artery bypass graft 2/01    Dr. Sheliah Plane  . Nephrectomy 2002    left partial  . Transurethral resection of prostate   . Septoplasty   . Tympanoplasty   . Tympanic membrane repair     implant  . Pacemaker insertion     12/07  . Kyphosis surgery  4.09    Dr. Colon Branch   Prior to Admission medications   Medication Sig Start Date End Date Taking? Authorizing Provider  albuterol (PROAIR HFA) 108 (90 BASE) MCG/ACT inhaler Inhale 2 puffs into the lungs every 6 (six) hours as needed.     Yes Historical Provider, MD  ALPRAZolam (XANAX) 0.25 MG tablet Take 0.25 mg by mouth 3 (three) times daily as needed.     Yes Historical Provider, MD  aspirin 81 MG tablet Take 81 mg by mouth daily.     Yes Historical Provider, MD  atorvastatin (LIPITOR) 80 MG tablet Take 80 mg by mouth daily.     Yes Historical Provider, MD  Calcium Carbonate-Vitamin D (CALCIUM 600/VITAMIN D) 600-400 MG-UNIT per tablet Take 2 tablets by mouth daily.     Yes Historical Provider, MD  carvedilol (COREG) 12.5 MG tablet Take 12.5 mg by mouth 2 (two) times daily with a meal.     Yes Historical Provider, MD  cefdinir (OMNICEF) 300 MG capsule Take 300 mg by mouth 2 (two) times daily. Therapy completed 2 days ago   Yes Historical Provider, MD  Coenzyme Q10 (COQ10) 100 MG CAPS Take 1 capsule by mouth daily.     Yes  Historical Provider, MD  folic acid (FOLVITE) 1 MG tablet TAKE 1 TABLET BY MOUTH EVERY DAY 03/19/11  Yes Coralyn Helling, MD  furosemide (LASIX) 40 MG tablet Take 80 mg by mouth 2 (two) times daily. Take 2 tablets (80 mg total) by mouth 2 (two) times daily. May take an extra 80mg  as needed 09/18/11  Yes Lewayne Bunting, MD  guaiFENesin (MUCINEX) 600 MG 12 hr tablet Take 600 mg by mouth 2 (two) times daily.     Yes Historical Provider, MD  isosorbide mononitrate (IMDUR) 60 MG 24 hr tablet TAKE 1 TABLET BY MOUTH DAILY 01/24/11  Yes Lewayne Bunting, MD  methotrexate (RHEUMATREX) 2.5 MG tablet Take 7.5 mg by mouth once a week. Take 3 tablets by mouth every Tuesday 08/12/11 08/11/12 Yes Coralyn Helling, MD  mometasone (NASONEX) 50 MCG/ACT nasal spray 2 sprays by Nasal route daily. 2 sprays daily as needed 12/05/10 12/05/11 Yes Coralyn Helling, MD  Multiple Vitamins-Minerals (MULTIVITAMIN WITH  MINERALS) tablet Take 1 tablet by mouth daily.     Yes Historical Provider, MD  potassium chloride SA (K-DUR,KLOR-CON) 20 MEQ tablet Take 2 tablets (40 mEq total) by mouth 2 (two) times daily. 07/29/11  Yes Lewayne Bunting, MD  predniSONE (DELTASONE) 10 MG tablet 4 tabs for 2 days, then 3 tabs for 2 days, 2 tabs for 2 days, then 1 tab for 2 days, then stop 09/15/11  Yes Tammy Parrett, NP  triamcinolone ointment (KENALOG) 0.1 % Apply 1 application topically 2 (two) times daily.  09/17/11  Yes Historical Provider, MD  nitroGLYCERIN (NITROSTAT) 0.4 MG SL tablet Place 1 tablet (0.4 mg total) under the tongue every 5 (five) minutes as needed. 07/29/11   Lewayne Bunting, MD   Allergies Allergies  Allergen Reactions  . Ciprofloxacin     myalgia  . Levofloxacin     REACTION: hives  . Penicillins     rash    Family History Family History  Problem Relation Age of Onset  . Coronary artery disease Father   . Tuberculosis Father   . Colon cancer Mother     Social History  reports that he has never smoked. He has never used smokeless tobacco. He reports that he drinks alcohol. He reports that he does not use illicit drugs.  Review Of Systems  11 points review of systems is negative with an exception of listed in HPI.  Vital Signs: Temp:  [97.6 F (36.4 C)-97.8 F (36.6 C)] 97.6 F (36.4 C) (01/20 1204) Pulse Rate:  [80-86] 86  (01/20 1445) Resp:  [18-22] 18  (01/20 1445) BP: (111-129)/(59-63) 111/59 mmHg (01/20 1445) SpO2:  [96 %-97 %] 97 % (01/20 1445) Weight:  [141 lb (63.957 kg)] 141 lb (63.957 kg) (01/20 1204)    Physical Examination: General:  WM NAD at rest Neuro:  intact   HEENT:no jvd, blind in left eye Cardiovascular:  hsr rrr Lungs:  Decreased air movement Abdomen:  Soft +bs  Musculoskeletal:  intact Skin:  Warm , no edema       Labs and Imaging:  Dg Chest 2 View  09/28/2011  *RADIOLOGY REPORT*  Clinical Data: Shortness of breath, pulmonary fibrosis  CHEST - 2 VIEW   Comparison: 03/06/2011  Findings: Chronic interstitial markings/pulmonary fibrosis.  No superimposed opacities suspicious for pneumonia. No pleural effusion or pneumothorax.  The heart is top normal in size. Postsurgical changes related to prior CABG. Left subclavian pacemaker.  Degenerative changes of the visualized thoracolumbar spine.  Prior lower thoracolumbar  vertebral augmentation.  IMPRESSION: Chronic interstitial markings/pulmonary fibrosis.  No superimposed opacities suspicious pneumonia.  Original Report Authenticated By: Charline Bills, M.D.   Ct Angio Chest W/cm &/or Wo Cm  09/28/2011  *RADIOLOGY REPORT*  Clinical Data: Shortness of breath  CT ANGIOGRAPHY CHEST  Technique:  Multidetector CT imaging of the chest using the standard protocol during bolus administration of intravenous contrast. Multiplanar reconstructed images including MIPs were obtained and reviewed to evaluate the vascular anatomy.  Contrast: OMNIPAQUE IOHEXOL 300 MG/ML IV SOLN  Comparison: Chest radiograph same date  Findings: The study is of adequate technical quality for evaluation for pulmonary embolism up to and including the 3rd order pulmonary arteries. No focal filling defect is seen to suggest acute pulmonary embolism.  Heart size is upper limits of normal.  Evidence of CABG noted. Great vessels demonstrate mild ascending aortic ectasia measuring 3.8 cm maximally in AP diameter on image 42 at the level of the pulmonary arterial bifurcation.  No lymphadenopathy.  No pleural or pericardial effusion.  Left-sided pacer is in place.  Biapical pleural thickening noted.  Emphysematous changes are noted.  No focal pulmonary opacity.  Thoracic compression deformity at T9 again noted.  IMPRESSION: No acute cardiopulmonary process.  COPD.  No CT evidence for acute pulmonary embolism.  Original Report Authenticated By: Harrel Lemon, M.D.    Lab 09/28/11 1244  NA 136  K 3.3*  CL 97  CO2 29  BUN 19  CREATININE 1.34    GLUCOSE 84    Lab 09/28/11 1244  HGB 13.7  HCT 40.0  WBC 9.0  PLT 214   ABG    Component Value Date/Time   HCO3 29.8* 01/03/2010 1437   TCO2 31 01/03/2010 1437   O2SAT 67.0 01/03/2010 1437   .pb Assessment and Plan: Hypoxia in setting off Pulmonary Fibrosis. He becomes more hypoxic when off steroids. Current Acute on Chronic Respiratory Failure -check limited ct sinus -tx sinus infection if present -steroid taper -abx  If needed   Best practices / Disposition: -send home to follow up with Dr. Craige Cotta -->family updated at bedside  [facility] [place] [facility] ACNP Adolph Pollack PCCM Pager [phone] till 3 pm If no answer page [phone] 09/28/2011, 3:16 PM  STAFF NOTE: I, Dr Lavinia Sharps have personally reviewed patient's available data, including medical history, events of note, physical examination and test results as part of my evaluation. I have discussed with NP and other care providers such as pharmacist, RN and RRT.  In addition,  I personally evaluated patient and elicited key findings of patient with pauci immune vasculitis with diffuse parenchymal lung disease maintained on weekly solo methotrexate and with baseline honeycombing of subpleural biabasal areas with some scattered GGO as of 2011. Presents to ER with subacute worsening of dyspnea, hx of office visits and prednisone/antibiotic treatment. Also hx of noticing marked desaturations t0 70s even with very minimal exertion past few weeks,. CT chest shows no PE but worsening ground glass opacities compared to 2011. There is some sinus worsening but could represent worsening vasculitis. Diagnosis is Acute on Chronic Respiratory Failure. DDx is worsening vasculitis, opportunistic infection, standard CAP or viral or methotrexate pneumonitis, CHF. HE very reluctantly agreed to admission after his son/daughter convinced him. Plan to Rx for CAP initially with steroids but test labs to rule out MI, CHF, and worsening vasculitis and depending on course  consider bronch to rule out opportunistic infection. He is DNR but given cardiac history will move him to tele. Agree with sinus  workup.  Rest per [facility] note is outlined above and that I agree with  Dr. Kalman Shan, M.D., [facility].C.P Pulmonary and Critical Care Medicine Staff Physician [facility] Pager: [phone], If no answer or between  15:00h - 7:00h: call [phone]  09/28/2011 4:45 PM

## 2011-09-28 NOTE — ED Provider Notes (Signed)
History     CSN: [id number]  Arrival date & time 09/28/11  1127   First MD Initiated Contact with Patient 09/28/11 1203      Chief Complaint  Patient presents with  . Shortness of Breath    (Consider location/radiation/quality/duration/timing/severity/associated sxs/prior treatment) Patient is a 78 y.o. male presenting with shortness of breath. The history is provided by the patient and a relative.  Shortness of Breath  Associated symptoms include shortness of breath.   patient here with increased shortness of breath for the past 3 weeks. He has a history of pulmonary fibrosis and does use home oxygen. He denies any fever, however he has been treated for a suspected bronchitis recently with antibiotics and a prednisone taper. Denies chest pain or diaphoresis. No vomiting or diarrhea. He continues to be on 4 L of oxygen at home. Symptoms are worse with any type of exertion and better with rest and increased oxygen  Past Medical History  Diagnosis Date  . Pauci-immune vasculitis     3/02 P-ANCA>1:640, Myeloperoxidase Ab 17.6, ANA neg, RPR negative, RF<20. 7/09 ANCA<1:20, PFT 7/09 FEV1 1.90(81%), FVC 2.66 (71%), TLC 5.08 (87%), DLCO 42%, MTX started 06/18/09  . Pulmonary fibrosis   . Community acquired pneumonia   . Chronic hypoxemic respiratory failure     4 liters oxygen  . Chronic rhinitis   . Prostate cancer     s/p external radiation and hormonal therapy  . Renal cell carcinoma   . History of enucleation of left eyeball     age 57  . Congestive heart failure     Dr. Jens Som: ECHO 07/09 EF 50%  . Coronary artery disease   . Ventricular tachyarrhythmia   . Second degree AV block   . Aortic regurgitation   . Hypertension   . Hyperlipidemia   . Anxiety   . Osteoporosis   . OSA (obstructive sleep apnea)   . Bursitis of elbow   . Gout   . Thyroid cyst   . GERD (gastroesophageal reflux disease)     Past Surgical History  Procedure Date  . Coronary artery bypass graft  2/01    Dr. Sheliah Plane  . Nephrectomy 2002    left partial  . Transurethral resection of prostate   . Septoplasty   . Tympanoplasty   . Tympanic membrane repair     implant  . Pacemaker insertion     12/07  . Kyphosis surgery 4.09    Dr. Colon Branch    Family History  Problem Relation Age of Onset  . Coronary artery disease Father   . Tuberculosis Father   . Colon cancer Mother     History  Substance Use Topics  . Smoking status: Never Smoker   . Smokeless tobacco: Never Used  . Alcohol Use: Yes     rarely has a glass of wine       Review of Systems  Respiratory: Positive for shortness of breath.   All other systems reviewed and are negative.    Allergies  Ciprofloxacin; Levofloxacin; and Penicillins  Home Medications   Current Outpatient Rx  Name Route Sig Dispense Refill  . ALBUTEROL SULFATE HFA 108 (90 BASE) MCG/ACT IN AERS Inhalation Inhale 2 puffs into the lungs every 6 (six) hours as needed.      . ALPRAZOLAM 0.25 MG PO TABS Oral Take 0.25 mg by mouth 3 (three) times daily as needed.      . ASPIRIN 81 MG PO TABS Oral Take 81  mg by mouth daily.      . ATORVASTATIN CALCIUM 80 MG PO TABS Oral Take 80 mg by mouth daily.      Marland Kitchen CALCIUM CARBONATE-VITAMIN D 600-400 MG-UNIT PO TABS Oral Take 2 tablets by mouth daily.      Marland Kitchen CARVEDILOL 12.5 MG PO TABS Oral Take 12.5 mg by mouth 2 (two) times daily with a meal.      . CEFDINIR 300 MG PO CAPS Oral Take 300 mg by mouth 2 (two) times daily. Therapy completed 2 days ago    . COQ10 100 MG PO CAPS Oral Take 1 capsule by mouth daily.      Marland Kitchen FOLIC ACID 1 MG PO TABS  TAKE 1 TABLET BY MOUTH EVERY DAY 30 tablet 5  . FUROSEMIDE 40 MG PO TABS Oral Take 80 mg by mouth 2 (two) times daily. Take 2 tablets (80 mg total) by mouth 2 (two) times daily. May take an extra 80mg  as needed    . GUAIFENESIN ER 600 MG PO TB12 Oral Take 600 mg by mouth 2 (two) times daily.      . ISOSORBIDE MONONITRATE ER 60 MG PO TB24  TAKE 1 TABLET BY  MOUTH DAILY 30 tablet 10  . METHOTREXATE 2.5 MG PO TABS Oral Take 7.5 mg by mouth once a week. Take 3 tablets by mouth every Tuesday    . MOMETASONE FUROATE 50 MCG/ACT NA SUSP Nasal 2 sprays by Nasal route daily. 2 sprays daily as needed 17 g 6  . MULTI-VITAMIN/MINERALS PO TABS Oral Take 1 tablet by mouth daily.      Marland Kitchen POTASSIUM CHLORIDE CRYS ER 20 MEQ PO TBCR Oral Take 2 tablets (40 mEq total) by mouth 2 (two) times daily. 120 tablet 5  . PREDNISONE 10 MG PO TABS  4 tabs for 2 days, then 3 tabs for 2 days, 2 tabs for 2 days, then 1 tab for 2 days, then stop 20 tablet 0  . TRIAMCINOLONE ACETONIDE 0.1 % EX OINT Topical Apply 1 application topically 2 (two) times daily.     Marland Kitchen NITROGLYCERIN 0.4 MG SL SUBL Sublingual Place 1 tablet (0.4 mg total) under the tongue every 5 (five) minutes as needed. 25 tablet 11    BP 129/63  Pulse 80  Temp(Src) 97.6 F (36.4 C) (Oral)  Resp 22  Wt 141 lb (63.957 kg)  SpO2 96%  Physical Exam  Nursing note and vitals reviewed. Constitutional: He is oriented to person, place, and time. He appears well-developed and well-nourished.  Non-toxic appearance. No distress.  HENT:  Head: Normocephalic and atraumatic.  Eyes: Conjunctivae, EOM and lids are normal. Pupils are equal, round, and reactive to light.  Neck: Normal range of motion. Neck supple. No tracheal deviation present. No mass present.  Cardiovascular: Normal rate, regular rhythm and normal heart sounds.  Exam reveals no gallop.   No murmur heard. Pulmonary/Chest: Effort normal. No stridor. No respiratory distress. He has no decreased breath sounds. He has no wheezes. He has rhonchi. He has no rales.  Abdominal: Soft. Normal appearance and bowel sounds are normal. He exhibits no distension. There is no tenderness. There is no rebound and no CVA tenderness.  Musculoskeletal: Normal range of motion. He exhibits no edema and no tenderness.  Neurological: He is alert and oriented to person, place, and time. He  has normal strength. No cranial nerve deficit or sensory deficit. GCS eye subscore is 4. GCS verbal subscore is 5. GCS motor subscore is 6.  Skin: Skin is warm and dry. No abrasion and no rash noted.  Psychiatric: He has a normal mood and affect. His speech is normal and behavior is normal.    ED Course  Procedures (including critical care time)  Labs Reviewed  DIFFERENTIAL - Abnormal; Notable for the following:    Lymphocytes Relative 6 (*)    Lymphs Abs 0.5 (*)    Monocytes Relative 13 (*)    Monocytes Absolute 1.2 (*)    All other components within normal limits  BASIC METABOLIC PANEL - Abnormal; Notable for the following:    Potassium 3.3 (*)    GFR calc non Af Amer 46 (*)    GFR calc Af Amer 54 (*)    All other components within normal limits  D-DIMER, QUANTITATIVE - Abnormal; Notable for the following:    D-Dimer, Quant 0.81 (*)    All other components within normal limits  PRO B NATRIURETIC PEPTIDE - Abnormal; Notable for the following:    Pro B Natriuretic peptide (BNP) 500.9 (*)    All other components within normal limits  CBC  POCT I-STAT TROPONIN I  I-STAT TROPONIN I   Dg Chest 2 View  09/28/2011  *RADIOLOGY REPORT*  Clinical Data: Shortness of breath, pulmonary fibrosis  CHEST - 2 VIEW  Comparison: 03/06/2011  Findings: Chronic interstitial markings/pulmonary fibrosis.  No superimposed opacities suspicious for pneumonia. No pleural effusion or pneumothorax.  The heart is top normal in size. Postsurgical changes related to prior CABG. Left subclavian pacemaker.  Degenerative changes of the visualized thoracolumbar spine.  Prior lower thoracolumbar vertebral augmentation.  IMPRESSION: Chronic interstitial markings/pulmonary fibrosis.  No superimposed opacities suspicious pneumonia.  Original Report Authenticated By: Charline Bills, M.D.     No diagnosis found.    MDM  Patient given oxygen here and is comfortable. X-rays and blood work reviewed. Spoke with  pulmonary critical care and they will come to see the patient. Suspect that patient has worsening of his pulmonary fibrosis, and that they will admit the patient        Toy Baker, MD 09/28/11 1436

## 2011-09-28 NOTE — Progress Notes (Signed)
ANTIBIOTIC CONSULT NOTE - INITIAL  Pharmacy Consult for Vancomycin, Aztreonam Indication:  R/o URI  Allergies  Allergen Reactions  . Ciprofloxacin     myalgia  . Levofloxacin     REACTION: hives  . Penicillins     rash    Patient Measurements: Weight: 141 lb (63.957 kg) Adjusted Body Weight:   Vital Signs: Temp: 97.6 F (36.4 C) (01/20 1204) Temp src: Oral (01/20 1204) BP: 111/59 mmHg (01/20 1445) Pulse Rate: 86  (01/20 1445) Intake/Output from previous day:   Intake/Output from this shift:    Labs:  Basename 09/28/11 1244  WBC 9.0  HGB 13.7  PLT 214  LABCREA --  CREATININE 1.34   The CrCl is unknown because both a height and weight (above a minimum accepted value) are required for this calculation. No results found for this basename: VANCOTROUGH:2,VANCOPEAK:2,VANCORANDOM:2,GENTTROUGH:2,GENTPEAK:2,GENTRANDOM:2,TOBRATROUGH:2,TOBRAPEAK:2,TOBRARND:2,AMIKACINPEAK:2,AMIKACINTROU:2,AMIKACIN:2, in the last 72 hours   Microbiology: No results found for this or any previous visit (from the past 720 hour(s)).  Medical History: Past Medical History  Diagnosis Date  . Pauci-immune vasculitis     3/02 P-ANCA>1:640, Myeloperoxidase Ab 17.6, ANA neg, RPR negative, RF<20. 7/09 ANCA<1:20, PFT 7/09 FEV1 1.90(81%), FVC 2.66 (71%), TLC 5.08 (87%), DLCO 42%, MTX started 06/18/09  . Pulmonary fibrosis   . Community acquired pneumonia   . Chronic hypoxemic respiratory failure     4 liters oxygen  . Chronic rhinitis   . Prostate cancer     s/p external radiation and hormonal therapy  . Renal cell carcinoma   . History of enucleation of left eyeball     age 29  . Congestive heart failure     Dr. Jens Som: ECHO 07/09 EF 50%  . Coronary artery disease   . Ventricular tachyarrhythmia   . Second degree AV block   . Aortic regurgitation   . Hypertension   . Hyperlipidemia   . Anxiety   . Osteoporosis   . OSA (obstructive sleep apnea)   . Bursitis of elbow   . Gout   .  Thyroid cyst   . GERD (gastroesophageal reflux disease)     Medications:  Scheduled:    . sodium chloride   Intravenous Once  . aspirin  324 mg Oral NOW   Or  . aspirin  300 mg Rectal NOW  . heparin  5,000 Units Subcutaneous Q8H  . pantoprazole  40 mg Oral Q1200   Infusions:    . sodium chloride     Assessment:  44 YOM with h/o pulmonary fibrosis and OSA admitted with hypoxemia.  Patient with FQ and PCN allergy to start Vancomycin and Aztreonam per pharmacy for r/o upper respiratory infections.    CXR 1/20 with "no superimposed opacities suspicious for pneumonia"  Pending PCT, blood cultures, lactic acid, strep pneumo and legionella antigen.    Afebrile, WBC wnl.  Scr 1.34.  Wt 64 kg.  Norm CrCl 40 ml/min.  CG CrCl 62 ml/min.  Goal of Therapy:  Vancomycin trough level 15-20 mcg/ml  Plan:   Vancomycin 750 mg IV q12h, first dose now  Aztreonam 1gm iv q8h  Follow up renal fxn and adjust as appropriate.  Geoffry Paradise Thi 09/28/2011,4:24 PM

## 2011-09-28 NOTE — ED Notes (Signed)
Pt states that he has become increasingly short of breath while walking small distances at home and performing small tasks.  Pt seen and tx with antibiotics for upper respiratory infections.  No xrays performed by pcp.  Pt on 4l o2 via n/c at home

## 2011-09-28 NOTE — Progress Notes (Signed)
[name]-Pt has refused to wear bipap at night.  Pt is non-compliant at home with machine as well, RT to monitor and assess as needed

## 2011-09-29 LAB — MRSA PCR SCREENING: MRSA by PCR: NEGATIVE

## 2011-09-29 LAB — CARDIAC PANEL(CRET KIN+CKTOT+MB+TROPI)
CK, MB: 2.1 ng/mL (ref 0.3–4.0)
CK, MB: 2.5 ng/mL (ref 0.3–4.0)
Relative Index: INVALID (ref 0.0–2.5)
Total CK: 24 U/L (ref 7–232)
Total CK: 25 U/L (ref 7–232)

## 2011-09-29 LAB — GLUCOSE, CAPILLARY: Glucose-Capillary: 117 mg/dL — ABNORMAL HIGH (ref 70–99)

## 2011-09-29 LAB — URINALYSIS, ROUTINE W REFLEX MICROSCOPIC
Glucose, UA: NEGATIVE mg/dL
Hgb urine dipstick: NEGATIVE
Ketones, ur: NEGATIVE mg/dL
Protein, ur: NEGATIVE mg/dL

## 2011-09-29 LAB — BASIC METABOLIC PANEL
BUN: 14 mg/dL (ref 6–23)
Calcium: 8.7 mg/dL (ref 8.4–10.5)
GFR calc non Af Amer: 48 mL/min — ABNORMAL LOW (ref >90)
Glucose, Bld: 107 mg/dL — ABNORMAL HIGH (ref 70–99)

## 2011-09-29 LAB — CBC
HCT: 35.4 % — ABNORMAL LOW (ref 39.0–52.0)
Hemoglobin: 11.7 g/dL — ABNORMAL LOW (ref 13.0–17.0)
MCH: 29.8 pg (ref 26.0–34.0)
MCHC: 33.1 g/dL (ref 30.0–36.0)

## 2011-09-29 MED ORDER — POTASSIUM CHLORIDE CRYS ER 20 MEQ PO TBCR
40.0000 meq | EXTENDED_RELEASE_TABLET | Freq: Once | ORAL | Status: AC
  Administered 2011-09-29: 40 meq via ORAL

## 2011-09-29 MED ORDER — METHYLPREDNISOLONE SODIUM SUCC 125 MG IJ SOLR
80.0000 mg | Freq: Two times a day (BID) | INTRAMUSCULAR | Status: DC
  Administered 2011-09-29 – 2011-10-01 (×4): 80 mg via INTRAVENOUS
  Administered 2011-10-01: 06:00:00 via INTRAVENOUS
  Administered 2011-10-02: 80 mg via INTRAVENOUS
  Administered 2011-10-02: 06:00:00 via INTRAVENOUS
  Administered 2011-10-03 – 2011-10-06 (×7): 80 mg via INTRAVENOUS
  Filled 2011-09-29 (×16): qty 1.28

## 2011-09-29 MED ORDER — BISOPROLOL FUMARATE 5 MG PO TABS
5.0000 mg | ORAL_TABLET | Freq: Every day | ORAL | Status: DC
  Administered 2011-09-29 – 2011-10-07 (×9): 5 mg via ORAL
  Filled 2011-09-29 (×10): qty 1

## 2011-09-29 MED ORDER — FAMOTIDINE 20 MG PO TABS
20.0000 mg | ORAL_TABLET | Freq: Every day | ORAL | Status: DC
  Administered 2011-09-29 – 2011-10-06 (×8): 20 mg via ORAL
  Filled 2011-09-29 (×9): qty 1

## 2011-09-29 MED ORDER — PANTOPRAZOLE SODIUM 40 MG PO TBEC
40.0000 mg | DELAYED_RELEASE_TABLET | Freq: Two times a day (BID) | ORAL | Status: DC
  Administered 2011-09-30 – 2011-10-07 (×15): 40 mg via ORAL
  Filled 2011-09-29 (×16): qty 1

## 2011-09-29 NOTE — Progress Notes (Signed)
Pt refuses to wear Bipap

## 2011-09-29 NOTE — Progress Notes (Addendum)
. Name: [name] MRN: [id number] DOB: 02/23/1923    LOS: 1  PCCM Progress  NOTE  Brief patient profile: : 78 year old male with  of pauci-immune vasculitis with pulmonary fibrosis, hypoxemia, and sleep apnea unable to tolerate BIPAP.  He has been tx x 2 since 09/02/11 for bronchitis with abx and steroids. Every time he comes off steroids he develops increased hypoxia refractory to home O2 at 5 l/m. + for yellow sinus drainage on a daily basis.   to ED 1/20 with sats on 76% on 5 l/m with activity.  He is a DNR per his request. PCCM asked to evaluate.   Subjective:  Pleasant elderly wm who is in no acute distress. Awake and articulate  Vital Signs: Temp:  [97.3 F (36.3 C)-98.2 F (36.8 C)] 97.3 F (36.3 C) (01/21 0800) Pulse Rate:  [79-87] 84  (01/21 0450) Resp:  [17-24] 24  (01/21 0450) BP: (95-159)/(43-83) 119/65 mmHg (01/21 0530) SpO2:  [91 %-98 %] 98 % (01/21 0450) FiO2 (%):  [50 %] 50 % (01/21 0450) Weight:  [141 lb (63.957 kg)-148 lb 2.4 oz (67.2 kg)] 148 lb 2.4 oz (67.2 kg) (01/21 0000) I/O last 3 completed shifts: In: 200 [IV Piggyback:200] Out: 1591 [Urine:1590; Stool:1]  Intake/Output Summary (Last 24 hours) at 09/29/11 1601 Last data filed at 09/29/11 1100  Gross per 24 hour  Intake    200 ml  Output   2041 ml  Net  -1841 ml    Physical Examination: General:  WM NAD at rest Neuro:  intact   HEENT: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  IMPRESSION: Chronic interstitial markings/pulmonary fibrosis.  No superimposed opacities suspicious pneumonia.     Lab 09/29/11 0805 09/28/11 1625 09/28/11 1244  NA 134* 134* 136  K 3.3* 3.4* 3.3*  CL 99 95* 97  CO2 27 27 29   BUN 14 17 19   CREATININE 1.29 1.22 1.34  GLUCOSE 107* 101* 84    Lab 09/29/11  0805 09/28/11 1625 09/28/11 1244  HGB 11.7* 13.5 13.7  HCT 35.4* 39.6 40.0  WBC 6.6 8.1 9.0  PLT 170 197 214   ABG    Component Value Date/Time   HCO3 29.8* 01/03/2010 1437   TCO2 31 01/03/2010 1437   O2SAT 67.0 01/03/2010 1437   .pb Assessment and Plan: Hypoxia in setting off Pulmonary Fibrosis. He becomes more hypoxic when off steroids. Refuses bipap in home setting. Near end stage.  -check sinus infection> neg   -off steroids and on methotrexate , Due for methotrexate 1/22 weekly dose.    DC METHOTREXATE AND GIVE STEROIDS  To see to what extent he improves, very poor candidate for even fob/bal and nothing to suggest PCP here     Best practices / Disposition:   -->wifeupdated at bedside  [facility] Minor ACNP Adolph Pollack PCCM Pager [phone] till 3 pm If no answer page [phone] 09/29/2011, 10:43 AM  Pt independently  seen and examined and available cxr's reviewed and I agree with above findings/ imp/ plan  Discussed in detail all the  indications, usual  risks and alternatives  relative to the benefits with patient who agrees to proceed steroid trial  Sandrea Hughs, MD  Pulmonary and Critical Care Medicine [facility] Cell [phone]

## 2011-09-30 ENCOUNTER — Encounter: Payer: Self-pay | Admitting: *Deleted

## 2011-09-30 DIAGNOSIS — J8410 Pulmonary fibrosis, unspecified: Secondary | ICD-10-CM

## 2011-09-30 DIAGNOSIS — J9620 Acute and chronic respiratory failure, unspecified whether with hypoxia or hypercapnia: Secondary | ICD-10-CM

## 2011-09-30 DIAGNOSIS — R0902 Hypoxemia: Secondary | ICD-10-CM

## 2011-09-30 LAB — BASIC METABOLIC PANEL
CO2: 26 mEq/L (ref 19–32)
Chloride: 103 mEq/L (ref 96–112)
Creatinine, Ser: 1.25 mg/dL (ref 0.50–1.35)
Potassium: 4.3 mEq/L (ref 3.5–5.1)

## 2011-09-30 LAB — PHOSPHORUS: Phosphorus: 4 mg/dL (ref 2.3–4.6)

## 2011-09-30 LAB — CBC
MCV: 90.7 fL (ref 78.0–100.0)
Platelets: 191 10*3/uL (ref 150–400)
RBC: 4.28 MIL/uL (ref 4.22–5.81)
WBC: 4.8 10*3/uL (ref 4.0–10.5)

## 2011-09-30 LAB — MAGNESIUM: Magnesium: 2.4 mg/dL (ref 1.5–2.5)

## 2011-09-30 NOTE — Progress Notes (Signed)
Pt refuses to wear bipap for the night.  Pt requests not to be asked to wear bipap/cpap again.  RT will address in report in the am.  Pt encouraged to notify RN/ RT of any concerns.

## 2011-09-30 NOTE — Clinical Documentation Improvement (Signed)
CHF DOCUMENTATION CLARIFICATION QUERY  THIS DOCUMENT IS NOT A PERMANENT PART OF THE MEDICAL RECORD  TO RESPOND TO THE THIS QUERY, FOLLOW THE INSTRUCTIONS BELOW:  1. If needed, update documentation for the patient's encounter via the notes activity.  2. Access this query again and click edit on the In [employer].  3. After updating, or not, click F2 to complete all highlighted (required) fields concerning your review. Select "additional documentation in the medical record" OR "no additional documentation provided".  4. Click Sign note button.  5. The deficiency will fall out of your In Basket *Please let us know if you are not able to complete this workflow by phone or e-mail (listed below).  Please update your documentation within the medical record to reflect your response to this query.                                                                                    09/30/11  Dear Dr. [facility],  In a better effort to capture your patient's severity of illness, reflect appropriate length of stay and utilization of resources, a review of the patient medical record has revealed the following indicators the diagnosis of Heart Failure.    Based on your clinical judgment, please clarify and document in a progress note and/or discharge summary the clinical condition associated with the following supporting information:  In responding to this query please exercise your independent judgment.  The fact that a query is asked, does not imply that any particular answer is desired or expected.   According to ED note on 09/28/11 pt  With h/o CHF.   Please clarify the following:   Whether or not you agree pt with CHF for this inpatient admission   If you agree please clarify the acuity and type of CHF for this inpatient admission necessitating the treatment of Lasix and Coreg.   Possible Clinical Conditions?   Acute Systolic Congestive Heart Failure Acute Diastolic  Congestive Heart Failure Acute Systolic & Diastolic Congestive Heart Failure Acute on Chronic Systolic Congestive Heart Failure Acute on Chronic Diastolic Congestive Heart Failure Acute on Chronic Systolic & Diastolic  Congestive Heart Failure Other Condition________________________________________ Cannot Clinically Determine  Supporting Information: Risk Factors H/O CHF, Hypoxic,Chronic hypoxemic respiratory failure, OSA (obstructive sleep apnea)    Signs & Symptoms:  PCCM Note 09/29/11 of pauci-immune vasculitis with pulmonary fibrosis, hypoxemia, and sleep apnea unable to tolerate BIPAP  ED Note 09/28/11 Shortness of Breath   Associated symptoms include shortness of breath.   patient here with increased shortness of breath for the past 3 weeks. He has a history of pulmonary fibrosis and does use home oxygen      Diagnostics: Treatment: Chronic hypoxemic respiratory failure  LASIX COREG PROVENTIL   Reviewed:  no additional documentation provided 1/24/2013ljh   Thank You,  Enis Slipper  RN, BSN, CCDS Clinical Documentation Specialist [facility] Dept Pager: [phone] / E-mail: [email or web address]  Health Information Management [place]

## 2011-09-30 NOTE — Progress Notes (Signed)
.   Name: [name] MRN: [id number] DOB: 1922/11/28    LOS: 2  PCCM Progress  NOTE  Brief patient profile: : 78 year old male with  of pauci-immune vasculitis with pulmonary fibrosis, hypoxemia, and sleep apnea unable to tolerate BIPAP.  He has been tx x 2 since 09/02/11 for bronchitis with abx and steroids. Every time he comes off steroids he develops increased hypoxia refractory to home O2 at 5 l/m. + for yellow sinus drainage on a daily basis.   to ED 1/20 with sats on 76% on 5 l/m with activity.  He is a DNR per his request. PCCM asked to evaluate.   Subjective:  Pleasant elderly wm who is in no acute distress. Awake and articulate  Vital Signs: Temp:  [97.3 F (36.3 C)-98.4 F (36.9 C)] 97.3 F (36.3 C) (01/22 0528) Pulse Rate:  [74-76] 74  (01/22 0528) Resp:  [18-30] 18  (01/22 0528) BP: (123-154)/(61-85) 154/85 mmHg (01/22 0528) SpO2:  [91 %-95 %] 94 % (01/22 0600) FiO2 (%):  [50 %] 50 % (01/22 0600) I/O last 3 completed shifts: In: 1060 [P.O.:660; Other:50; IV Piggyback:350] Out: 3391 [Urine:3390; Stool:1]  Intake/Output Summary (Last 24 hours) at 09/30/11 1334 Last data filed at 09/30/11 0908  Gross per 24 hour  Intake   1100 ml  Output   1651 ml  Net   -551 ml    Physical Examination: General:  WM NAD at rest Neuro:  intact   HEENT: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 He becomes more hypoxic when off steroids. Refuses bipap in  home setting. Near end stage. Steroid trial initiated on 1/21.  Plan: -cont steroid trial -wean O2 -stop all abx -pt refusing bipap, will d/c  Sandrea Hughs, MD Pulmonary and Critical Care Medicine [facility] Cell [phone]

## 2011-09-30 NOTE — Progress Notes (Signed)
Pt transferred to room 1301, report called to receiving RN Sallyanne Havers. CCM called twice re unreconciled transfer orders.

## 2011-09-30 NOTE — Clinical Documentation Improvement (Signed)
RESPIRATORY FAILURE DOCUMENTATION CLARIFICATION QUERY   THIS DOCUMENT IS NOT A PERMANENT PART OF THE MEDICAL RECORD  TO RESPOND TO THE THIS QUERY, FOLLOW THE INSTRUCTIONS BELOW:  1. If needed, update documentation for the patient's encounter via the notes activity.  2. Access this query again and click edit on the In [employer].  3. After updating, or not, click F2 to complete all highlighted (required) fields concerning your review. Select "additional documentation in the medical record" OR "no additional documentation provided".  4. Click Sign note button.  5. The deficiency will fall out of your In Basket *Please let us know if you are not able to complete this workflow by phone or e-mail (listed below).  Please update your documentation within the medical record to reflect your response to this query.                                                                                    09/30/11  Dear Dr. Karren Burly,  In a better effort to capture your patient's severity of illness, reflect appropriate length of stay and utilization of resources, a review of the patient medical record has revealed the following indicators.    Based on your clinical judgment, please clarify and document in a progress note and/or discharge summary the clinical condition associated with the following supporting information:  In responding to this query please exercise your independent judgment.  The fact that a query is asked, does not imply that any particular answer is desired or expected.  Pt admitted with pauci-immune vasculitis with pulmonary fibrosis and hypoxemia  According to ED note 09/28/11 pt with Chronic hypoxemic respiratory failure.    Please clarify whether or not chronic hypoxemic respiratory failure can be further specified as one of the diagnoses listed below and document in pn or d/c summary.     Possible Clinical Conditions?  _______Acute Respiratory  Failure ____x___Acute on Chronic Respiratory Failure _______Chronic Respiratory Failure _______Acute Respiratory Insufficiency _______Acute Respiratory Insufficiency following surgery or trauma _______Other Condition________________ _______Cannot Clinically Determine    Supporting Information:  Risk Factors:  pauci-immune vasculitis with pulmonary fibrosis   Signs&Symptoms: Hypoxemia  Diagnostics: Lab:  Radiology:  Treatment: FiO2 50 per venti mask  Treatment:  Refusing BIPAP PROVENTIL    You may use possible, probable, or suspect with inpatient documentation. possible, probable, suspected diagnoses MUST be documented at the time of discharge  Reviewed: additional documentation in the medical record  Thank You,  Enis Slipper  RN, BSN, CCDS Clinical Documentation Specialist [facility] Dept Pager: [phone] / E-mail: [email or web address]  Health Information Management [place]

## 2011-10-01 ENCOUNTER — Telehealth: Payer: Self-pay | Admitting: Pulmonary Disease

## 2011-10-01 ENCOUNTER — Inpatient Hospital Stay (HOSPITAL_COMMUNITY): Payer: Medicare Other

## 2011-10-01 LAB — MPO/PR-3 (ANCA) ANTIBODIES

## 2011-10-01 LAB — ANTI-NUCLEAR AB-TITER (ANA TITER): ANA Titer 1: NEGATIVE

## 2011-10-01 LAB — PROCALCITONIN: Procalcitonin: <0.1 ng/mL

## 2011-10-01 LAB — SEDIMENTATION RATE: Sed Rate: 59 mm/hr — ABNORMAL HIGH (ref 0–16)

## 2011-10-01 MED ORDER — FUROSEMIDE 10 MG/ML IJ SOLN
40.0000 mg | Freq: Once | INTRAMUSCULAR | Status: AC
  Administered 2011-10-01: 40 mg via INTRAVENOUS
  Filled 2011-10-01: qty 4

## 2011-10-01 MED ORDER — FENTANYL 25 MCG/HR TD PT72
25.0000 ug | MEDICATED_PATCH | TRANSDERMAL | Status: DC
  Administered 2011-10-01 – 2011-10-04 (×2): 25 ug via TRANSDERMAL
  Filled 2011-10-01 (×3): qty 1

## 2011-10-01 MED ORDER — POTASSIUM CHLORIDE 20 MEQ/15ML (10%) PO LIQD
20.0000 meq | Freq: Once | ORAL | Status: AC
  Administered 2011-10-01: 20 meq via ORAL
  Filled 2011-10-01: qty 15

## 2011-10-01 NOTE — Progress Notes (Signed)
CARE MANAGEMENT NOTE 10/01/2011  Patient:  Bontempo,Chandlar C   Account Number:  [id number]  Date Initiated:  10/01/2011  Documentation initiated by:  Ebersole,Jalaiya Oyster  Subjective/Objective Assessment:   pt with resp fibrosis and failure refuses bipap or vent,     Action/Plan:   palliative care decision being made   Anticipated DC Date:  10/02/2011   Anticipated DC Plan:  HOME W HOSPICE CARE  In-house referral  NA      DC Planning Services  CM consult      PAC Choice  NA   Choice offered to / List presented to:  NA   DME arranged  NA      DME agency  NA  NA     HH arranged  NA      HH agency  NA   Status of service:  In process, will continue to follow Medicare Important Message given?  YES (If response is "NO", the following Medicare IM given date fields will be blank) Date Medicare IM given:  09/28/2011 Date Additional Medicare IM given:    Discharge Disposition:    Per UR Regulation:  Reviewed for med. necessity/level of care/duration of stay  Comments:  [id number]/Bexley Laubach Persichetti,Rn,BSn,CCM

## 2011-10-01 NOTE — Telephone Encounter (Signed)
Please inform her that I have been in communication with [facility] rounding team, and agree that hospice evaluation is appropriate.  Please assure her that we will continue therapies as appropriate, but that home hospice can be a valuable resource given the severity of his illness.

## 2011-10-01 NOTE — Progress Notes (Signed)
Foley inserted prior to diuretic administration, purpose of foley catheter and procedure for insertion explained and questions answered. Patient voiced understanding. Procedure tolerated well, no difficulty during insertion.

## 2011-10-01 NOTE — Clinical Documentation Improvement (Signed)
RESPIRATORY FAILURE DOCUMENTATION CLARIFICATION QUERY   THIS DOCUMENT IS NOT A PERMANENT PART OF THE MEDICAL RECORD  TO RESPOND TO THE THIS QUERY, FOLLOW THE INSTRUCTIONS BELOW:  1. If needed, update documentation for the patient's encounter via the notes activity.  2. Access this query again and click edit on the In [employer].  3. After updating, or not, click F2 to complete all highlighted (required) fields concerning your review. Select "additional documentation in the medical record" OR "no additional documentation provided".  4. Click Sign note button.  5. The deficiency will fall out of your In Basket Potential Diagnosis:  Please update your documentation within the medical record to reflect your response to this query. 09/30/11  Dear Dr. Karren Burly,  In a better effort to capture your patient's severity of illness, reflect appropriate length of stay and utilization of resources, a review of the patient medical record has revealed the following indicators.   Based on your clinical judgment, please clarify and document in a progress note and/or discharge summary the clinical condition associated with the following supporting information:  In responding to this query please exercise your independent judgment. The fact that a query is asked, does not imply that any particular answer is desired or expected.  Pt admitted with pauci-immune vasculitis with pulmonary fibrosis and hypoxemia  According to ED note 09/28/11 pt with Chronic hypoxemic respiratory failure. Please clarify whether or not chronic hypoxemic respiratory failure can be further specified as one of the diagnoses listed below and document in pn or d/c summary.     Possible Clinical Conditions?  _______Acute Respiratory Failure _______Acute on Chronic Respiratory Failure _______Chronic Respiratory Failure _______Acute Respiratory Insufficiency _______Acute Respiratory Insufficiency following surgery or  trauma _______Other Condition________________ _______Cannot Clinically Determine      Supporting Information:  Risk Factors:  Clinical Information:   Supporting Information:  Risk Factors: pauci-immune vasculitis with pulmonary fibrosis   Signs&Symptoms: Hypoxemia  Diagnostics: Lab:  Radiology:  Treatment: FiO2 50 per venti mask  Treatment:  Refusing BIPAP PROVENTIL    You may use possible, probable, or suspect with inpatient documentation. possible, probable, suspected diagnoses MUST be documented at the time of discharge  Reviewed:  no additional documentation provided   You may use possible, probable, or suspect with inpatient documentation. possible, probable, suspected diagnoses MUST be documented at the time of discharge  Reviewed:  no additional documentation provided 10/02/2011 ljh  Thank You,  Enis Slipper  RN, BSN, CCDS Clinical Documentation Specialist [facility] Dept Pager: [phone] / E-mail: [email or web address] Health Information Management [place]

## 2011-10-01 NOTE — Progress Notes (Signed)
.   Name: [name] MRN: [id number] DOB: 11/24/1922    LOS: 3  PCCM Progress  NOTE  Brief patient profile: : 40 yowm with  of pauci-immune vasculitis with pulmonary fibrosis, hypoxemia, and sleep apnea unable to tolerate BIPAP.  He has been tx x 2 since 09/02/11 for bronchitis with abx and steroids. Every time he comes off steroids he develops increased hypoxia refractory to home O2 at 5 l/m. + for yellow sinus drainage on a daily basis.   to ED 1/20 with sats on 76% on 5 l/m with activity.  He is a DNR per his request. PCCM asked to evaluate 1/20   Subjective:  Feels worse. And more SOB  Vital Signs: Temp:  [97.9 F (36.6 C)-98.5 F (36.9 C)] 98.3 F (36.8 C) (01/23 0522) Pulse Rate:  [74-77] 75  (01/23 0522) Resp:  [17-20] 20  (01/23 0522) BP: (123-144)/(72-88) 144/72 mmHg (01/23 0522) SpO2:  [88 %-91 %] 88 % (01/23 0522) FiO2 (%):  [50 %] 50 % (01/23 0522) I/O last 3 completed shifts: In: 810 [P.O.:660; IV Piggyback:150] Out: 1601 [Urine:1600; Stool:1]  Intake/Output Summary (Last 24 hours) at 10/01/11 1208 Last data filed at 10/01/11 1125  Gross per 24 hour  Intake    240 ml  Output    700 ml  Net   -460 ml    Physical Examination: General:  WM NAD at rest Neuro:  intact   HEENT: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 He becomes more hypoxic when off steroids. Refuses bipap in home setting. Near end stage. Steroid trial initiated on  1/21. CXR w/ decreased aeration.  Plan: -cont steroid trial -diurese, place foley -wean O2 -pt has requested that should things not improve that we transition to comfort care mode. He has requested full DNR status.  - add duragesic and titrate up starting 1/23  Sandrea Hughs, MD Pulmonary and Critical Care Medicine [facility] Cell [phone]

## 2011-10-01 NOTE — Telephone Encounter (Signed)
I spoke with the pt daughter and she wanted to know what Dr. Evlyn Courier thoughts were on ordering hospice for this patient. The pt is currently in patient at this time. Debby just wanted to know if this is something that Dr. Craige Cotta felt was appropriate for the patient at this time? Our physicians are rounding on this pt while he is in the hospital so if hospice is ok we can have consult ordered while pt is in the hospital if this is wanted.  Please advise. Carron Curie, CMA

## 2011-10-01 NOTE — Telephone Encounter (Signed)
I spoke with debbie and advised her of VS response. She agree's with what he stated. [name] had no further questions at this time

## 2011-10-02 DIAGNOSIS — J8410 Pulmonary fibrosis, unspecified: Secondary | ICD-10-CM

## 2011-10-02 DIAGNOSIS — J9620 Acute and chronic respiratory failure, unspecified whether with hypoxia or hypercapnia: Secondary | ICD-10-CM

## 2011-10-02 DIAGNOSIS — R0902 Hypoxemia: Secondary | ICD-10-CM

## 2011-10-02 NOTE — Discharge Summary (Signed)
Physician Discharge Summary  Patient ID: [name] MRN: [id number] DOB/AGE: 76-Jan-1924 76 y.o.  Admit date: 09/28/2011 Discharge date: 10/02/2011  Admission Diagnoses: Acute on chronic respiratory failure  Discharge Diagnoses:  Principal Problem:  *Acute-on-chronic respiratory failure in setting of IPF flare.   Brief patient profile: : 78 year old male with of pauci-immune vasculitis with pulmonary fibrosis, hypoxemia, and sleep apnea unable to tolerate BIPAP. He has been tx x 2 since 09/02/11 for bronchitis with abx and steroids. Every time he comes off steroids he develops increased hypoxia refractory to home O2 at 5 l/m. + for yellow sinus drainage on a daily basis. to ED 1/20 with sats on 76% on 5 l/m with activity. He is a DNR per his request. PCCM asked to evaluate.   Hospital Course:  Hypoxia in setting off Pulmonary Fibrosis/ IPF flare. He becomes more hypoxic when off steroids. Refuses bipap in home setting. Near end stage. Steroid trial initiated on 1/21. Did not improve with high dose systemic steroids, Bronchodilators, empiric antibiotics or diuretics.Given his lack of progress and desire to go home we consulted palliative care. He has since been transitioned to Oximizer O2 delivery system in effort to allow him to go home on oxygen at 9 liters.He will be discharged to home with instructions for slow prednisone taper, and symptomatic focus of care with the assistance of home hospice services  Plan:  -cont steroid with slow weaning taper will hold at 20mg  dose for now as we have stopped the methotrexate.  -wean O2 as tolerated -home hospice -f/u in our office for further weaning of steroids.   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, blind in left eye  Cardiovascular: hsr rrr  Lungs: Decreased air movement, crackles t/o  Abdomen: Soft +bs  Musculoskeletal: intact  Skin: Warm , no edema   Labs at discharge Lab Results  Component Value Date   CREATININE 1.25 09/30/2011   BUN 17 09/30/2011   NA 137 09/30/2011   K 4.3 09/30/2011   CL 103 09/30/2011   CO2 26 09/30/2011   Lab Results  Component Value Date   WBC 4.8 09/30/2011   HGB 13.4 09/30/2011   HCT 38.8* 09/30/2011   MCV 90.7 09/30/2011   PLT 191 09/30/2011   Lab Results  Component Value Date   ALT 11 09/28/2011   AST 18 09/28/2011   ALKPHOS 68 09/28/2011   BILITOT 1.0 09/28/2011   Lab Results  Component Value Date   INR 1.10 09/28/2011   INR 1.06 01/03/2010   INR 1.05 11/15/2009    Current radiology studies Dg Chest Port 1 View  10/01/2011  *RADIOLOGY REPORT*  Clinical Data: Pneumonia.  PORTABLE CHEST - 1 VIEW  Comparison: 09/28/2011 CT and radiographs.  Findings: Chronic interstitial changes are present which are accentuated by technique.  There is probable superimposed airspace disease which may represent edema or pneumonia.  Unchanged left subclavian cardiac pacemaker.  CABG.  No effusion.  IMPRESSION:  1.  Chronic interstitial changes with superimposed bilateral airspace disease.  Differential considerations include pulmonary edema, acute phase interstitial lung disease or multifocal pneumonia. 2.  Postop changes of CABG and left subclavian pacemaker.  Original Report Authenticated By: Andreas Newport, M.D.    Disposition:  home Discharge Orders    Future Appointments: Provider: Department: Dept Phone: Center:   10/10/2011 11:30 AM Rubye Oaks, NP [facility] [phone] None   10/15/2011 3:00 PM Coralyn Helling, MD [facility] [phone] None   12/25/2011 12:05 PM Amber Caryl Bis, RN [facility] [phone] [facility]     Medication List  As of 10/02/2011   4:05 PM   ASK your doctor about these medications         ALPRAZolam 0.25 MG tablet   Commonly known as: XANAX   Take 0.25 mg by mouth 3 (three) times daily as needed.      aspirin 81 MG tablet   Take 81 mg by mouth daily.      atorvastatin 80 MG tablet   Commonly known as: LIPITOR   Take 80 mg by mouth daily.      CALCIUM 600/VITAMIN D 600-400 MG-UNIT per tablet   Generic drug: Calcium Carbonate-Vitamin D   Take 2 tablets by mouth daily.      carvedilol 12.5 MG tablet   Commonly known as: COREG   Take 12.5 mg by mouth 2 (two) times daily with a meal.      cefdinir 300 MG capsule   Commonly known as: OMNICEF   Take 300 mg by mouth 2 (two) times daily. Therapy completed 2 days ago      CoQ10 100 MG Caps   Take 1 capsule by mouth daily.      folic acid 1 MG tablet   Commonly known as: FOLVITE   TAKE 1 TABLET BY MOUTH EVERY DAY      furosemide 40 MG tablet   Commonly known as: LASIX   Take 80 mg by mouth 2 (two) times daily. Take 2 tablets (80 mg total) by mouth 2 (two) times daily. May take an extra 80mg  as needed      guaiFENesin 600 MG 12 hr tablet   Commonly known as: MUCINEX   Take 600 mg by mouth 2 (two) times daily.      isosorbide mononitrate 60 MG 24 hr tablet   Commonly known as: IMDUR   TAKE 1 TABLET BY MOUTH DAILY      methotrexate 2.5 MG tablet   Commonly known as: RHEUMATREX   Take 7.5 mg by mouth once a week. Take 3 tablets by mouth every Tuesday      mometasone 50 MCG/ACT nasal spray   Commonly known as: NASONEX   2 sprays by Nasal route daily. 2 sprays daily as needed      multivitamin with minerals tablet   Take 1 tablet by mouth daily.      nitroGLYCERIN 0.4 MG SL tablet   Commonly known as: NITROSTAT   Place 1 tablet (0.4 mg total) under the tongue every 5 (five) minutes as needed.      potassium chloride SA 20 MEQ tablet   Commonly known as: K-DUR,KLOR-CON   Take 2 tablets (40 mEq total) by mouth 2 (two) times daily.      predniSONE 10  MG tablet   Commonly known as: DELTASONE   4 tabs for 2 days, then 3 tabs for 2 days, 2 tabs for 2 days, then 1 tab for 2 days, then stop      PROAIR HFA 108 (90 BASE) MCG/ACT inhaler   Generic drug: albuterol   Inhale 2 puffs into  the lungs every 6 (six) hours as needed.      triamcinolone ointment 0.1 %   Commonly known as: KENALOG   Apply 1 application topically 2 (two) times daily.           Follow-up Information    Follow up with PARRETT,TAMMY, NP on 10/10/2011. (at [facility] )    Contact information:   [facility] [address] [place] [address] [phone]          Discharged Condition: good  Signed: BABCOCK,PETE 10/02/2011, 4:05 PM  Levy Pupa, MD, PhD 10/07/2011, 6:51 PM [facility] [phone] or if no answer [phone]

## 2011-10-02 NOTE — Progress Notes (Signed)
Pt slept well from 2230  To 0330 AM. Woke up with 02 sats at 96%  On partial non-rebreather, pulse 88. Pt very confused and agitated. Refused any prn medication for anxiety. Pt would not put pulse ox prob on. Charge nurse went in to talk to pt , pt requested to talk to daughter. We called and pt is now resting quietly in bed without any respitory distress noted  But still refuses continous pulse ox monitor. Rn made respitory therapist aware. Will continue to monitor.

## 2011-10-02 NOTE — Progress Notes (Signed)
.   Name: [name] MRN: [id number] DOB: 10/18/1922    LOS: 4  PCCM Progress  NOTE  Brief patient profile: : 5 yowm with  of pauci-immune vasculitis with pulmonary fibrosis, hypoxemia, and sleep apnea unable to tolerate BIPAP.  He has been tx x 2 since 09/02/11 for bronchitis with abx and steroids. Every time he comes off steroids he develops increased hypoxia refractory to home O2 at 5 l/m. + for yellow sinus drainage on a daily basis.   to ED 1/20 with sats on 76% on 5 l/m with activity.  He is a DNR per his request. PCCM asked to evaluate 1/20   Subjective:  No better, now on NRM  Vital Signs: Temp:  [97.6 F (36.4 C)-97.9 F (36.6 C)] 97.6 F (36.4 C) (01/24 1416) Pulse Rate:  [74-77] 74  (01/24 1416) Resp:  [18] 18  (01/24 1416) BP: (111-139)/(52-79) 111/66 mmHg (01/24 1416) SpO2:  [90 %-96 %] 96 % (01/24 1416) FiO2 (%):  [50 %] 50 % (01/24 1416) I/O last 3 completed shifts: In: 240 [P.O.:240] Out: 3325 [Urine:3325]  Intake/Output Summary (Last 24 hours) at 10/02/11 1645 Last data filed at 10/02/11 1416  Gross per 24 hour  Intake    360 ml  Output   1100 ml  Net   -740 ml    Physical Examination: General:  WM NAD at rest Neuro:  intact   HEENT: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 He becomes more hypoxic when off steroids. Refuses bipap in home setting. Near end stage. Steroid trial initiated on 1/21. CXR w/ decreased aeration.    Plan: -cont steroid trial -adjust fi02 -pt has requested that should things not improve that we transition to comfort care mode. He has requested full DNR status.  - add duragesic and titrate up starting 1/23 - nothing else to offer here  Sandrea Hughs, MD Pulmonary and Critical Care Medicine [facility] Cell [phone]

## 2011-10-03 MED ORDER — MORPHINE SULFATE (CONCENTRATE) 20 MG/ML PO SOLN: 5.0000 mg | ORAL | Status: DC | PRN

## 2011-10-03 MED ORDER — MORPHINE SULFATE (CONCENTRATE) 20 MG/ML PO SOLN: 10.0000 mg | ORAL | Status: DC | PRN

## 2011-10-03 NOTE — Consult Note (Signed)
Consult was requested by: Anders Simmonds NP   Consult is for review of medical treatment options, clarification of goals of care and end of life issues, disposition and options, and symptom recommendation.  This NP Lorinda Creed reviewed medical records, received report from team, assessed the patient and then meet at the patient's bedside along with his daughter Peri Jefferson #[phone], sons Amjad Fikes #[phone] and Charls Custer [phone]  to discuss diagnosis prognosis, GOC, EOL wishes disposition and options.  Problem List: 1.  Pulmonary Fibrosis -weakness, dyspnea, fatigue 2.CAD 3. HTN 4. CHF 5. Adult failure to thrive/ Palliative Performance Scale is 30%    Patient Goals/ Recommendations: 1. DNR/DNI 2. No artificial feeding or hydration now or in the future -comfort feeds and fluids of choice 3. Symptom management --utilize prn meds to maximize comfort 4. DC home with hospice when stable. Will write for choice.  Family mention [facility]    patient does not want to move to PCU  A detailed discussion was had today regarding advanced directives.  Concepts specific to code status, artifical feeding and hydration, continued IV antibiotics and rehospitalization was had.  The difference between a aggressive medical intervention path  and a palliative comfort care path for this patient at this time was had.  Values and goals of care important to patient and family were attempted to be elicited.  Concept of Hospice and Palliative Care were discussed  Natural trajectory and expectations at EOL were discussed.  Questions and concerns addressed.  Hard Choices booklet left for review. Family encouraged to call with questions or concerns.  PMT will continue to support holistically.  Total time spent on the unit was 75 minutes.  Time in 1400- time out 1515. Greater than 50% of the time was spent in counseling and coordination of care.   Lorinda Creed NP   [phone]

## 2011-10-03 NOTE — Progress Notes (Signed)
Palliative Medicine Team consult for symptom management; hospice eligibility requested by Dr Jackquline Bosch, NP spoke with patient's daughter Peri Jefferson [phone]) will be back to hospital this afternoon PMT provider will meet with daughter and patient today, 10/03/11 Friday @ 1:30-2 pm   Valente David, RN 10/03/2011, 1:10 PM Palliative Medicine Team RN Liaison [phone]

## 2011-10-03 NOTE — Progress Notes (Signed)
CARE MANAGEMENT NOTE 10/03/2011  Patient:  Mcwright,Zylon C   Account Number:  [id number]  Date Initiated:  10/01/2011  Documentation initiated by:  Vanosdol,RHONDA  Subjective/Objective Assessment:   pt with resp fibrosis and failure refuses bipap or vent,     Action/Plan:   palliative care decision being made   Anticipated DC Date:  10/06/2011   Anticipated DC Plan:  HOME W HOSPICE CARE  In-house referral  Hospice / Palliative Care      DC Planning Services  CM consult      [facility] Choice  NA   Choice offered to / List presented to:  pt and family   DME arranged  [facility]      DME agency  [facility]     HH arranged  NA      HH agency  OTHER - SEE NOTE   Status of service:  In process, will continue to follow Medicare Important Message given?  YES  Per UR Regulation:  Reviewed for med. necessity/level of care/duration of stay  Comments:  10/03/11, Kathi Der, RNC-MNN, BSN, [phone], CM received referral to offer Home Hospice choice.  CM met with pt, daughter, and 2 sons to offer choice for Home Hospice.  Pt. and family stated they still have questions before they commit to [facility].  Pt. and family have chosen [facility] if they choose Home Hospice.  CM spoke with pt's nurse to notify her pt and family would like to speak with pt's MD or NP to ask some more questions.  Pt's nurse stated she would call MD to make aware pt and family have questions.  Will follow.

## 2011-10-03 NOTE — Progress Notes (Signed)
.   Name: [name] MRN: [id number] DOB: [birth year]    LOS: 5  PCCM Progress  NOTE  Brief patient profile: : 50 yowm with  of pauci-immune vasculitis with pulmonary fibrosis, hypoxemia, and sleep apnea unable to tolerate BIPAP.  He has been tx x 2 since 09/02/11 for bronchitis with abx and steroids. Every time he comes off steroids he develops increased hypoxia refractory to home O2 at 5 l/m. + for yellow sinus drainage on a daily basis.   to ED 1/20 with sats on 76% on 5 l/m with activity.  He is a DNR per his request. PCCM asked to evaluate 1/20   Subjective:  No better, now on NRM, still with marked WOB  Vital Signs: Temp:  [97.4 F (36.3 C)-97.6 F (36.4 C)] 97.4 F (36.3 C) (01/24 2120) Pulse Rate:  [74-84] 84  (01/25 0532) Resp:  [18-20] 20  (01/25 0532) BP: (111-153)/(66-83) 153/83 mmHg (01/25 0532) SpO2:  [92 %-96 %] 92 % (01/25 0532) FiO2 (%):  [50 %] 50 % (01/25 0532) Weight:  [64.6 kg (142 lb 6.7 oz)] 64.6 kg (142 lb 6.7 oz) (01/25 0532) I/O last 3 completed shifts: In: 360 [P.O.:360] Out: 1900 [Urine:1900]  Intake/Output Summary (Last 24 hours) at 10/03/11 1020 Last data filed at 10/03/11 0914  Gross per 24 hour  Intake    480 ml  Output   1450 ml  Net   -970 ml    Physical Examination: General:  WM NAD at rest Neuro:  intact   HEENT:no jvd, blind in left eye Cardiovascular:  hsr rrr Lungs:  Decreased air movement, crackles t/o Abdomen:  Soft +bs  Musculoskeletal:  intact Skin:  Warm , no edema   Labs and Imaging:     Lab 09/30/11 0315 09/29/11 0805 09/28/11 1625  NA 137 134* 134*  K 4.3 3.3* 3.4*  CL 103 99 95*  CO2 26 27 27   BUN 17 14 17   CREATININE 1.25 1.29 1.22  GLUCOSE 149* 107* 101*    Lab 09/30/11 0315 09/29/11 0805 09/28/11 1625  HGB 13.4 11.7* 13.5  HCT 38.8* 35.4* 39.6  WBC 4.8 6.6 8.1  PLT 191 170 197   Assessment and Plan: Hypoxia in setting off Pulmonary Fibrosis. He becomes more hypoxic when off steroids.End stage. Steroid  trial initiated on 1/21. CXR w/ decreased aeration and no clinical improvement.  Plan: -cont steroid trial -adjust fi02 -pt has requested that should things not improve that we transition to comfort care mode. He has requested full DNR status.  - added duragesic and titrate up starting 1/23 - nothing else to offer here, will focus on titration of pain symptoms and ask for palliative care consult for home hospice choice as well as symptom support     Sandrea Hughs, MD Pulmonary and Critical Care Medicine [facility] Cell [phone]

## 2011-10-04 DIAGNOSIS — R0902 Hypoxemia: Secondary | ICD-10-CM

## 2011-10-04 DIAGNOSIS — J9620 Acute and chronic respiratory failure, unspecified whether with hypoxia or hypercapnia: Secondary | ICD-10-CM

## 2011-10-04 DIAGNOSIS — J8410 Pulmonary fibrosis, unspecified: Secondary | ICD-10-CM

## 2011-10-04 LAB — CULTURE, BLOOD (ROUTINE X 2)
Culture  Setup Time: 201301202106
Culture  Setup Time: 201301202106

## 2011-10-04 NOTE — Progress Notes (Signed)
Subjective: No change overnight.  Mild increased wob at rest.  sats ok at rest but fall quickly with movement of any kind.  Objective: Vital signs in last 24 hours: Blood pressure 158/83, pulse 75, temperature 97.7 F (36.5 C), temperature source Oral, resp. rate 18, height 5\' 3"  (1.6 m), weight 64.592 kg (142 lb 6.4 oz), SpO2 98.00%.  Intake/Output from previous day: 01/25 0701 - 01/26 0700 In: 240 [P.O.:240] Out: 1975 [Urine:1975]   Physical Exam:   well developed male in nad Chest with crackles 1/2 up bilat, no wheezing Cor with mild tachy abd benign Alert and oriented, moves all 4.   Lab Results: No results found for this basename: WBC:3,HGB:3,HCT:3,PLT:3 in the last 72 hours BMET No results found for this basename: NA:3,K:3,CL:3,CO2:3,GLUCOSE:3,BUN:3,CREATININE:3,CALCIUM:3 in the last 72 hours  Studies/Results: No results found.  Assessment/Plan: Patient Active Hospital Problem List:  Acute-on-chronic respiratory failure (09/28/2011) secondary to PF and pulmonary vasculitis.  He continues to have increased wob, but is not uncomfortable.  Is on IV steroids to see if he will improve.  We have encouraged him to consider hospice/palliative care, but he and the family are struggling with this.  For now will continue current rx.       Barbaraann Share, M.D. 10/04/2011, 10:35 AM

## 2011-10-04 NOTE — Progress Notes (Signed)
Progress Note from the Palliative Medicine Team at [facility]  Subjective:pt alert and oriented, family at bedside, all more comfortable to day with conversation relating to his GOC     Objective: Allergies  Allergen Reactions  . Ciprofloxacin     myalgia  . Levofloxacin     REACTION: hives  . Penicillins     rash   Scheduled Meds:   . aspirin  81 mg Oral Daily  . bisoprolol  5 mg Oral Daily  . famotidine  20 mg Oral QHS  . fentaNYL  25 mcg Transdermal Q72H  . guaiFENesin  600 mg Oral BID  . heparin  5,000 Units Subcutaneous Q8H  . isosorbide mononitrate  15 mg Oral Daily  . methylPREDNISolone (SOLU-MEDROL) injection  80 mg Intravenous Q12H  . pantoprazole  40 mg Oral BID AC  . triamcinolone ointment  1 application Topical BID   Continuous Infusions:  PRN Meds:.albuterol, ALPRAZolam, morphine, nitroGLYCERIN  BP 113/63  Pulse 76  Temp(Src) 97.5 F (36.4 C) (Oral)  Resp 18  Ht 5\' 3"  (1.6 m)  Wt 64.592 kg (142 lb 6.4 oz)  BMI 25.22 kg/m2  SpO2 94%   PPS:30%  Pain Score:denies Pain Location   Intake/Output Summary (Last 24 hours) at 10/04/11 1707 Last data filed at 10/04/11 1200  Gross per 24 hour  Intake    840 ml  Output   2075 ml  Net  -1235 ml       Physical Exam:  General: weak, sob at rest HEENT:  Dry mucous membranes Chest:   diminished  CVS: RRR Abdomen:soft NT +BS Ext: without edema Neuro:alert and oriented  Labs: CBC    Component Value Date/Time   WBC 4.8 09/30/2011 0315   RBC 4.28 09/30/2011 0315   HGB 13.4 09/30/2011 0315   HCT 38.8* 09/30/2011 0315   PLT 191 09/30/2011 0315   MCV 90.7 09/30/2011 0315   MCH 31.3 09/30/2011 0315   MCHC 34.5 09/30/2011 0315   RDW 14.6 09/30/2011 0315   LYMPHSABS 0.5* 09/28/2011 1244   MONOABS 1.2* 09/28/2011 1244   EOSABS 0.5 09/28/2011 1244   BASOSABS 0.0 09/28/2011 1244    BMET    Component Value Date/Time   NA 137 09/30/2011 0315   K 4.3 09/30/2011 0315   CL 103 09/30/2011 0315   CO2 26 09/30/2011 0315    GLUCOSE 149* 09/30/2011 0315   BUN 17 09/30/2011 0315   CREATININE 1.25 09/30/2011 0315   CALCIUM 9.5 09/30/2011 0315   GFRNONAA 50* 09/30/2011 0315   GFRAA 58* 09/30/2011 0315    CMP     Component Value Date/Time   NA 137 09/30/2011 0315   K 4.3 09/30/2011 0315   CL 103 09/30/2011 0315   CO2 26 09/30/2011 0315   GLUCOSE 149* 09/30/2011 0315   BUN 17 09/30/2011 0315   CREATININE 1.25 09/30/2011 0315   CALCIUM 9.5 09/30/2011 0315   PROT 6.9 09/28/2011 1625   ALBUMIN 3.2* 09/28/2011 1625   AST 18 09/28/2011 1625   ALT 11 09/28/2011 1625   ALKPHOS 68 09/28/2011 1625   BILITOT 1.0 09/28/2011 1625   GFRNONAA 50* 09/30/2011 0315   GFRAA 58* 09/30/2011 0315      1  Assessment and Plan: 1. Code Status:DNR/DNI 2. Symptom Control:comfortable 3. Psycho/Social:emotional support offered, questions and concerns addressed , anticipatory needs discussed 4. Spiritual 5. Disposition: does not want to transfer to PCU, Plan is home with hospice when stable, family is putting logistics in place  Lorinda Creed NP

## 2011-10-05 NOTE — Progress Notes (Addendum)
Cm spoke with pt with daughter at the bedside concerning d/c planning. Per pt and daughter pt spoke with MD on Friday to address concerns, per pt choice [facility] to provide home hospice care upon discharge.Cm given permission to fax documents to [facility] to begin intake process. Cm faxed documents to [phone]. CM spoke with on-call RN Dois Davenport at [facility] [place], Dois Davenport verified a rep from the agency would contact the pt and family Monday 10/06/11.   [name], [name] [phone]

## 2011-10-05 NOTE — Progress Notes (Signed)
Subjective: No change overnight.  Mild increased wob at rest.  sats ok at rest but fall quickly with movement of any kind. Using oximizer with high flow oxygen  Objective: Vital signs in last 24 hours: Blood pressure 105/60, pulse 54, temperature 97.3 F (36.3 C), temperature source Oral, resp. rate 18, height 5\' 3"  (1.6 m), weight 64.592 kg (142 lb 6.4 oz), SpO2 99.00%.  Intake/Output from previous day: 01/26 0701 - 01/27 0700 In: 1720 [P.O.:1720] Out: 2300 [Urine:2300]   Physical Exam:   well developed male in nad Chest with crackles 1/2 up bilat, no wheezing Cor with mild tachy abd benign Alert and oriented, moves all 4.   Lab Results: No results found for this basename: WBC:3,HGB:3,HCT:3,PLT:3 in the last 72 hours BMET No results found for this basename: NA:3,K:3,CL:3,CO2:3,GLUCOSE:3,BUN:3,CREATININE:3,CALCIUM:3 in the last 72 hours  Studies/Results: No results found.  Assessment/Plan: Patient Active Hospital Problem List:  Acute-on-chronic respiratory failure (09/28/2011) secondary to PF and pulmonary vasculitis.  He continues to have increased wob, but is not uncomfortable.  Is on IV steroids to see if he will improve.  He and the family have decided on home hospice, and now have to work out the details for d/c home. At some point need to change to oral prednisone.  Barbaraann Share, M.D. 10/05/2011, 3:05 PM

## 2011-10-06 DIAGNOSIS — J9620 Acute and chronic respiratory failure, unspecified whether with hypoxia or hypercapnia: Secondary | ICD-10-CM

## 2011-10-06 DIAGNOSIS — J8410 Pulmonary fibrosis, unspecified: Secondary | ICD-10-CM

## 2011-10-06 DIAGNOSIS — R0902 Hypoxemia: Secondary | ICD-10-CM

## 2011-10-06 MED ORDER — PREDNISONE 20 MG PO TABS: 20.0000 mg | ORAL_TABLET | Freq: Every day | ORAL | Status: DC

## 2011-10-06 MED ORDER — PREDNISONE 20 MG PO TABS: 30.0000 mg | ORAL_TABLET | Freq: Every day | ORAL | Status: DC

## 2011-10-06 MED ORDER — PREDNISONE 50 MG PO TABS
50.0000 mg | ORAL_TABLET | Freq: Every day | ORAL | Status: DC
  Administered 2011-10-06 – 2011-10-07 (×2): 50 mg via ORAL
  Filled 2011-10-06 (×3): qty 1

## 2011-10-06 MED ORDER — PREDNISONE 20 MG PO TABS: 40.0000 mg | ORAL_TABLET | Freq: Every day | ORAL | Status: DC

## 2011-10-06 NOTE — Progress Notes (Signed)
CSW reviewed chart, Pt plans to go home with home hospice. RN CM to arrange. No current CSW needs identified.  [name], [place] 10/06/2011 11:25 AM [phone]

## 2011-10-06 NOTE — Progress Notes (Signed)
Remote pacer check  

## 2011-10-06 NOTE — Progress Notes (Signed)
.   Name: [name] MRN: [id number] DOB: May 06, 1923    LOS: 8  PCCM Progress  NOTE  Brief patient profile: : 67 yowm with  of pauci-immune vasculitis with pulmonary fibrosis, hypoxemia, and sleep apnea unable to tolerate BIPAP.  He has been tx x 2 since 09/02/11 for bronchitis with abx and steroids. Every time he comes off steroids he develops increased hypoxia refractory to home O2 at 5 l/m. + for yellow sinus drainage on a daily basis.   to ED 1/20 with sats on 76% on 5 l/m with activity.  He is a DNR per his request. PCCM asked to evaluate 1/20   Subjective:  No better, now on NRM, still with marked WOB  Vital Signs: Temp:  [97.2 F (36.2 C)-97.6 F (36.4 C)] 97.2 F (36.2 C) (01/28 0515) Pulse Rate:  [54-79] 75  (01/28 0515) Resp:  [18-20] 19  (01/28 0515) BP: (105-143)/(60-85) 143/85 mmHg (01/28 0515) SpO2:  [97 %-99 %] 99 % (01/28 0515) FiO2 (%):  [100 %] 100 % (01/28 0515) I/O last 3 completed shifts: In: 1740 [P.O.:1740] Out: 3451 [Urine:3450; Stool:1]  Intake/Output Summary (Last 24 hours) at 10/06/11 1127 Last data filed at 10/06/11 0515  Gross per 24 hour  Intake    860 ml  Output   1850 ml  Net   -990 ml    Physical Examination: General:  WM NAD at rest Neuro:  intact   HEENT:no jvd, blind in left eye Cardiovascular:  hsr rrr Lungs:  Decreased air movement, crackles t/o Abdomen:  Soft +bs  Musculoskeletal:  intact Skin:  Warm , no edema   Labs and Imaging:     Lab 09/30/11 0315  NA 137  K 4.3  CL 103  CO2 26  BUN 17  CREATININE 1.25  GLUCOSE 149*    Lab 09/30/11 0315  HGB 13.4  HCT 38.8*  WBC 4.8  PLT 191   Assessment and Plan: Hypoxia in setting off Pulmonary Fibrosis. No response to steroids.End stage. Steroid trial initiated on 1/21.  Plan: -taper steroids -adjust fi02 - full DNR status.  - home with hospice on 1/29  Levy Pupa, MD, PhD 10/06/2011, 3:38 PM [facility] [phone] or if no answer  [phone]

## 2011-10-06 NOTE — Progress Notes (Signed)
CSW will assist with transport to home tomorrow. Out of facility DNR form left in shadow chart for signatures.  Vennie Homans, [place] 10/06/2011 1:38 PM [phone]

## 2011-10-07 MED ORDER — FENTANYL 25 MCG/HR TD PT72: 1.0000 | MEDICATED_PATCH | TRANSDERMAL | Status: AC

## 2011-10-07 MED ORDER — MORPHINE SULFATE (CONCENTRATE) 20 MG/ML PO SOLN: 5.0000 mg | ORAL | Status: AC | PRN

## 2011-10-07 MED ORDER — PREDNISONE 10 MG PO TABS: ORAL_TABLET | ORAL | Status: AC

## 2011-10-07 MED ORDER — BISOPROLOL FUMARATE 5 MG PO TABS: 5.0000 mg | ORAL_TABLET | Freq: Every day | ORAL | Status: AC

## 2011-10-07 NOTE — Progress Notes (Signed)
CARE MANAGEMENT NOTE 10/07/2011  Patient:  Rost,Kalub C   Account Number:  [id number]  Date Initiated:  10/01/2011  Documentation initiated by:  Najarian,RHONDA  Subjective/Objective Assessment:   pt with resp fibrosis and failure refuses bipap or vent,     Action/Plan:   palliative care decision being made   Anticipated DC Date:  10/06/2011   Anticipated DC Plan:  HOME W HOSPICE CARE  In-house referral  Hospice / Palliative Care      DC Planning Services  CM consult      [facility] Choice  NA   Choice offered to / List presented to:  C-4 Adult Children   DME arranged  [facility]      DME agency  [facility]     HH arranged  NA      HH agency  OTHER - SEE NOTE   Status of service:  Completed, signed off Medicare Important Message given?  YES (If response is "NO", the following Medicare IM given date fields will be blank) Date Medicare IM given:  09/28/2011 Date Additional Medicare IM given:    Discharge Disposition:  HOME W HOSPICE CARE  Per UR Regulation:  Reviewed for med. necessity/level of care/duration of stay  Comments:  10/03/11, Kathi Der, RNC-MNN, BSN, [phone], CM received referral to offer Home Hospice choice.  CM met with pt, daughter, and 2 sons to offer choice for Home Hospice.  Pt. and family stated they still have questions before they commit to [facility].  Pt. and family have chosen [facility] if they choose Home Hospice.  CM spoke with pt's nurse to notify her pt and family would like to speak with pt's MD or NP to ask some more questions.  Pt's nurse stated she would call MD to make aware pt and family have questions.  Will follow. [id number] Thole,Rn,BSn,CCM

## 2011-10-07 NOTE — Progress Notes (Signed)
CSW prepared needed transport via ambulance. DNR paperwork completed. [facility] coming at 11am to transport. CSW updated Pt and daughter. Vennie Homans, [place] 10/07/2011 9:30 AM [phone]

## 2011-10-10 ENCOUNTER — Inpatient Hospital Stay: Payer: Medicare Other | Admitting: Adult Health

## 2011-10-15 ENCOUNTER — Ambulatory Visit: Payer: Medicare Other | Admitting: Pulmonary Disease

## 2011-10-24 ENCOUNTER — Other Ambulatory Visit: Payer: Self-pay | Admitting: *Deleted

## 2011-10-24 MED ORDER — FOLIC ACID 1 MG PO TABS: 1.0000 mg | ORAL_TABLET | Freq: Every day | ORAL | Status: AC

## 2011-11-12 ENCOUNTER — Inpatient Hospital Stay: Payer: Medicare Other | Admitting: Pulmonary Disease

## 2011-12-25 ENCOUNTER — Ambulatory Visit (INDEPENDENT_AMBULATORY_CARE_PROVIDER_SITE_OTHER): Payer: Medicare Other | Admitting: *Deleted

## 2011-12-25 DIAGNOSIS — I442 Atrioventricular block, complete: Secondary | ICD-10-CM

## 2011-12-26 ENCOUNTER — Encounter: Payer: Self-pay | Admitting: Internal Medicine

## 2011-12-31 LAB — REMOTE PACEMAKER DEVICE
AL AMPLITUDE: 2.8 mv
AL IMPEDENCE PM: 376 Ohm
ATRIAL PACING PM: 89.48
BATTERY VOLTAGE: 2.97 V
RV LEAD IMPEDENCE PM: 664 Ohm
VENTRICULAR PACING PM: 0.41

## 2012-01-05 NOTE — Progress Notes (Signed)
Remote pacer check  

## 2012-01-06 ENCOUNTER — Encounter: Payer: Self-pay | Admitting: *Deleted

## 2012-01-06 ENCOUNTER — Telehealth: Payer: Self-pay | Admitting: Cardiology

## 2012-01-06 NOTE — Telephone Encounter (Signed)
New Problem:     I called the patient and found out that he is bed ridden in Hospice care and is not able to make any doctor's appointments.

## 2012-07-05 ENCOUNTER — Telehealth: Payer: Self-pay | Admitting: Internal Medicine

## 2012-07-05 NOTE — Telephone Encounter (Signed)
07-05-12 pt deceased 07/19/12 per obits in new and record/mt

## 2012-07-09 DEATH — deceased
